# Patient Record
Sex: Female | Born: 1935 | Race: White | Hispanic: No | Smoking: Never smoker
Health system: Southern US, Community
[De-identification: ages and names within clinical notes are randomized; demographics above are authoritative.]

## PROBLEM LIST (undated history)

## (undated) DIAGNOSIS — E785 Hyperlipidemia, unspecified: Secondary | ICD-10-CM

## (undated) DIAGNOSIS — G20 Parkinson's disease: Secondary | ICD-10-CM

## (undated) DIAGNOSIS — I10 Essential (primary) hypertension: Secondary | ICD-10-CM

## (undated) DIAGNOSIS — I4891 Unspecified atrial fibrillation: Secondary | ICD-10-CM

## (undated) DIAGNOSIS — I495 Sick sinus syndrome: Secondary | ICD-10-CM

## (undated) HISTORY — DX: Parkinson's disease: G20

## (undated) HISTORY — PX: GALLBLADDER SURGERY: SHX652

## (undated) HISTORY — PX: HIP SURGERY: SHX245

## (undated) HISTORY — PX: CERVICAL DISC SURGERY: SHX588

## (undated) HISTORY — DX: Essential (primary) hypertension: I10

## (undated) HISTORY — DX: Hyperlipidemia, unspecified: E78.5

## (undated) HISTORY — DX: Unspecified atrial fibrillation: I48.91

## (undated) HISTORY — DX: Sick sinus syndrome: I49.5

## (undated) HISTORY — PX: OTHER SURGICAL HISTORY: SHX169

---
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
## 1964-08-22 HISTORY — PX: ABDOMINAL HYSTERECTOMY: SHX81

## 1967-08-23 HISTORY — PX: CHOLECYSTECTOMY: SHX55

## 1994-08-22 HISTORY — PX: WRIST SURGERY: SHX841

## 2008-08-22 HISTORY — PX: BREAST BIOPSY: SHX20

## 2008-11-06 ENCOUNTER — Emergency Department: Payer: Self-pay | Admitting: Emergency Medicine

## 2010-04-21 ENCOUNTER — Ambulatory Visit: Payer: Self-pay | Admitting: Gastroenterology

## 2010-04-21 LAB — HM COLONOSCOPY

## 2010-06-26 ENCOUNTER — Inpatient Hospital Stay: Payer: Self-pay | Admitting: Internal Medicine

## 2010-06-29 ENCOUNTER — Encounter: Payer: Self-pay | Admitting: Internal Medicine

## 2010-06-29 ENCOUNTER — Ambulatory Visit: Payer: Self-pay | Admitting: Cardiology

## 2010-06-29 HISTORY — PX: PACEMAKER PLACEMENT: SHX43

## 2010-07-02 ENCOUNTER — Ambulatory Visit: Payer: Self-pay | Admitting: Internal Medicine

## 2010-07-07 ENCOUNTER — Encounter: Payer: Self-pay | Admitting: Internal Medicine

## 2010-07-07 ENCOUNTER — Ambulatory Visit: Payer: Self-pay | Admitting: Cardiovascular Disease

## 2010-07-07 LAB — CONVERTED CEMR LAB: POC INR: 2.8

## 2010-07-08 ENCOUNTER — Ambulatory Visit: Payer: Self-pay | Admitting: Cardiology

## 2010-07-08 DIAGNOSIS — Z950 Presence of cardiac pacemaker: Secondary | ICD-10-CM

## 2010-07-08 DIAGNOSIS — I4891 Unspecified atrial fibrillation: Secondary | ICD-10-CM | POA: Insufficient documentation

## 2010-07-08 DIAGNOSIS — I10 Essential (primary) hypertension: Secondary | ICD-10-CM

## 2010-07-12 ENCOUNTER — Encounter: Payer: Self-pay | Admitting: Internal Medicine

## 2010-07-12 ENCOUNTER — Ambulatory Visit: Payer: Self-pay | Admitting: Internal Medicine

## 2010-07-14 ENCOUNTER — Ambulatory Visit: Payer: Self-pay | Admitting: Cardiovascular Disease

## 2010-07-14 LAB — CONVERTED CEMR LAB: POC INR: 4.9

## 2010-07-21 ENCOUNTER — Ambulatory Visit: Payer: Self-pay | Admitting: Cardiovascular Disease

## 2010-07-21 ENCOUNTER — Encounter: Payer: Self-pay | Admitting: Cardiology

## 2010-07-21 DIAGNOSIS — I87329 Chronic venous hypertension (idiopathic) with inflammation of unspecified lower extremity: Secondary | ICD-10-CM

## 2010-07-23 LAB — CONVERTED CEMR LAB
Chloride: 98 meq/L (ref 96–112)
Potassium: 4.3 meq/L (ref 3.5–5.3)
Sodium: 137 meq/L (ref 135–145)

## 2010-08-04 ENCOUNTER — Ambulatory Visit: Payer: Self-pay | Admitting: Cardiovascular Disease

## 2010-08-04 LAB — CONVERTED CEMR LAB: POC INR: 2.3

## 2010-08-18 ENCOUNTER — Ambulatory Visit: Payer: Self-pay | Admitting: Cardiovascular Disease

## 2010-08-18 ENCOUNTER — Telehealth: Payer: Self-pay | Admitting: Internal Medicine

## 2010-08-18 LAB — CONVERTED CEMR LAB: POC INR: 1

## 2010-08-25 ENCOUNTER — Ambulatory Visit: Admission: RE | Admit: 2010-08-25 | Discharge: 2010-08-25 | Payer: Self-pay | Source: Home / Self Care

## 2010-08-25 LAB — CONVERTED CEMR LAB: POC INR: 1.4

## 2010-09-01 ENCOUNTER — Ambulatory Visit: Admission: RE | Admit: 2010-09-01 | Discharge: 2010-09-01 | Payer: Self-pay | Source: Home / Self Care

## 2010-09-08 ENCOUNTER — Ambulatory Visit: Admission: RE | Admit: 2010-09-08 | Discharge: 2010-09-08 | Payer: Self-pay | Source: Home / Self Care

## 2010-09-08 LAB — CONVERTED CEMR LAB: POC INR: 2.7

## 2010-09-10 ENCOUNTER — Telehealth: Payer: Self-pay | Admitting: Internal Medicine

## 2010-09-10 ENCOUNTER — Encounter: Payer: Self-pay | Admitting: Internal Medicine

## 2010-09-10 ENCOUNTER — Ambulatory Visit
Admission: RE | Admit: 2010-09-10 | Discharge: 2010-09-10 | Payer: Self-pay | Source: Home / Self Care | Attending: Internal Medicine | Admitting: Internal Medicine

## 2010-09-15 ENCOUNTER — Ambulatory Visit: Admission: RE | Admit: 2010-09-15 | Discharge: 2010-09-15 | Payer: Self-pay | Source: Home / Self Care

## 2010-09-15 LAB — CONVERTED CEMR LAB: POC INR: 3.1

## 2010-09-21 NOTE — Op Note (Signed)
[facility], [facility]   [facility]   Imported By: Roderic Ovens 07/05/2010 10:23:33  _____________________________________________________________________  External Attachment:    Type:   Image     Comment:   External Document

## 2010-09-21 NOTE — Procedures (Signed)
Summary: 8:30 APPT WITH PAULA ONLY/AMD   Current Medications (verified): 1)  Metoprolol Succinate 25 Mg Xr24h-Tab (Metoprolol Succinate) .... One Tablet Once Daily 2)  Meclizine Hcl 25 Mg Tabs (Meclizine Hcl) .... One Tablet Three Times A Day As Needed 3)  Losartan Potassium 100 Mg Tabs (Losartan Potassium) .... One Tablet Every A.m. and 1 1/2 Tablet Every P.m. 4)  Vitamin D3 0400 Unit Caps (Cholecalciferol) .... One By Mouth Daily 5)  Vitamin B-12 500 Mcg Tabs (Cyanocobalamin) .... One Tablet Once Daily 6)  Lovastatin 20 Mg Tabs (Lovastatin) .... One Tablet At Bedtime 7)  Tylenol 8 Hour 650 Mg Cr-Tabs (Acetaminophen) .... As Needed 8)  Requip 0.5 Mg Tabs (Ropinirole Hcl) .... One Tablet Every 8 Hours As Needed 9)  Norvasc 10 Mg Tabs (Amlodipine Besylate) .... One Tablet Once Daily 10)  Warfarin Sodium 5 Mg Tabs (Warfarin Sodium) .... One Tablet Once Daily  Allergies (verified): 1)  ! Codeine 2)  ! Demerol 3)  ! Morphine 4)  ! Valium 5)  ! Jonne Ply   PPM Specifications Following MD:  Lewayne Bunting, MD     PPM Vendor:  Medtronic     PPM Model Number:  ADDR01     PPM Serial Number:  [id number] PPM DOI:  06/29/2010     PPM Implanting MD:  Lewayne Bunting, MD  Lead 1    Location: RA     DOI: 06/29/2010     Model #: [id number]     Serial #: [id number]     Status: active Lead 2    Location: RV     DOI: 06/29/2010     Model #: [id number]     Serial #: [id number]     Status: active  Magnet Response Rate:  BOL 85 ERI 65  Indications:  Sick sinus syndrome   PPM Follow Up Remote Check?  No Battery Voltage:  2.80 V     Battery Est. Longevity:  10.5 years     Pacer Dependent:  No       PPM Device Measurements Atrium  Amplitude: 2.0 mV, Impedance: 601 ohms, Threshold: 0.5 V at 0.4 msec Right Ventricle  Amplitude: 16.0 mV, Impedance: 644 ohms, Threshold: 0.5 V at 0.4 msec  Episodes MS Episodes:  0     Percent Mode Switch:  <0.1%     Coumadin:  Yes Ventricular High Rate:  0     Atrial Pacing:  79.8%      Ventricular Pacing:  0.6%  Parameters Mode:  DDDR     Lower Rate Limit:  60     Upper Rate Limit:  120 Paced AV Delay:  180     Sensed AV Delay:  150 Next Cardiology Appt Due:  09/22/2010 Tech Comments:  Steri strips removed no redness or edema noted.  Rate response on today.  Device function normal.  ROV 3 months with Dr. Ladona Ridgel in [place]. Altha Harm, LPN  July 12, 2010 9:02 AM

## 2010-09-21 NOTE — Assessment & Plan Note (Signed)
Summary: NP6/AMD   Visit Type:  Initial Consult Primary Provider:  Nilda Simmer, M.D.  CC:  F/U [facility].  Denies chest pain or shortness of breath.  Has a fullness in head today.Whitney Hoffman  History of Present Illness: 77 yo with history of paroxysmal atrial fibrillation, HTN, and recent pacemaker implantation presents to establish outpatient cardiology care.  Patient was admitted to [facility] in 11/11 with syncope.  She was found to have tachy-brady syndrome with atrial fibrillation as well as periods of long pauses.  She has previously been followed for her atrial fibrillation by [facility] in [place].  A Medtronic dual chamber pacemaker was placed and patient was started on coumadin for her atrial fibrillation.   Since going home, she has been doing well.  She lives alone.  She is able to do housework without trouble.  No episodes of chest pain or exertional dyspnea.    ECG: a-paced, otherwise normal  Labs (11/11): K 4, creatinine 0.79  Preventive Screening-Counseling & Management  Alcohol-Tobacco     Smoking Status: never      Drug Use:  no.    Current Medications (verified): 1)  Metoprolol Succinate 25 Mg Xr24h-Tab (Metoprolol Succinate) .... One Tablet Once Daily 2)  Meclizine Hcl 25 Mg Tabs (Meclizine Hcl) .... One Tablet Three Times A Day As Needed 3)  Losartan Potassium 100 Mg Tabs (Losartan Potassium) .... One Tablet Once Daily 4)  Vitamin D3 10000 Unit Caps (Cholecalciferol) .... Four Daily 5)  Vitamin B-12 500 Mcg Tabs (Cyanocobalamin) .... One Tablet Once Daily 6)  Lovastatin 20 Mg Tabs (Lovastatin) .... One Tablet At Bedtime 7)  Aspir-Low 81 Mg Tbec (Aspirin) .... One Tablet Once Daily 8)  Tylenol 8 Hour 650 Mg Cr-Tabs (Acetaminophen) .... As Needed 9)  Requip 0.5 Mg Tabs (Ropinirole Hcl) .... One Tablet Every 8 Hours As Needed 10)  Norvasc 10 Mg Tabs (Amlodipine Besylate) .... One Tablet Once Daily 11)  Warfarin Sodium 5 Mg Tabs (Warfarin Sodium) .... One Tablet Once  Daily  Allergies (verified): 1)  ! Codeine 2)  ! Demerol 3)  ! Morphine 4)  ! Valium  Past History:  Family History: Last updated: 07-28-2010 Father:CAD; deceased MI age 79. Mother: Deceased; diabetes  Social History: Last updated: Jul 28, 2010 Retired  Widowed, lives alone, 3 children.  Lives in [place].   Tobacco Use - No.  Alcohol Use - no Drug Use - no  Risk Factors: Smoking Status: never (2010/07/28)  Past Medical History: 1. Hypertension 2. Hyperlipidemia 3. Parkinson's disease 4. Atrial fibrillation  5. Tachy-brady syndrome with Medtronic dual chamber PCM  Past Surgical History: appendectomy hysterectomy gallbladder surgery breast biopsy  disk surgery  hip surgery  Family History: Reviewed history and no changes required. Father:CAD; deceased [place] age 28. Mother: Deceased; diabetes  Social History: Retired  Widowed, lives alone, 3 children.  Lives in [place].   Tobacco Use - No.  Alcohol Use - no Drug Use - no Smoking Status:  never Drug Use:  no  Review of Systems       All systems reviewed and negative except as per HPI.   Vital Signs:  Patient profile:   77 year old female Height:      63 inches Weight:      147.25 pounds BMI:     26.18 Pulse rate:   60 / minute BP sitting:   144 / 60  (left arm) Cuff size:   regular  Vitals Entered By: Bishop Dublin, CMA (07/28/10 4:16  PM)  Physical Exam  General:  Well developed, well nourished, in no acute distress. Head:  normocephalic and atraumatic Nose:  no deformity, discharge, inflammation, or lesions Mouth:  Teeth, gums and palate normal. Oral mucosa normal. Neck:  Neck supple, no JVD. No masses, thyromegaly or abnormal cervical nodes. Lungs:  Clear bilaterally to auscultation and percussion. Heart:  Non-displaced PMI, chest non-tender; regular rate and rhythm, S1, S2 without murmurs, rubs. +S4. Carotid upstroke normal, no bruit. Pedals normal pulses. No edema, no  varicosities. Abdomen:  Bowel sounds positive; abdomen soft and non-tender without masses, organomegaly, or hernias noted. No hepatosplenomegaly. Extremities:  No clubbing or cyanosis. Neurologic:  Alert and oriented x 3. Skin:  Intact without lesions or rashes. Psych:  Normal affect.   PPM Specifications Following MD:  Lewayne Bunting, MD     PPM Vendor:  Medtronic     PPM Model Number:  ADDR01     PPM Serial Number:  [id number] PPM DOI:  06/29/2010     PPM Implanting MD:  Lewayne Bunting, MD  Lead 1    Location: RA     DOI: 06/29/2010     Model #: 1914     Serial #: [id number]     Status: active Lead 2    Location: RV     DOI: 06/29/2010     Model #: [id number]     Serial #: [id number]     Status: active  Magnet Response Rate:  BOL 85 ERI 65  Indications:  Sick sinus syndrome   Impression & Recommendations:  Problem # 1:  PACEMAKER, PERMANENT (ICD-V45.01) Tachy-brady syndrome s/p Medtronic dual chamber pacemaker.  She will be set up for regular PCM checks.   Problem # 2:  ATRIAL FIBRILLATION (ICD-427.31) Patient currently is a-paced.  She is on warfarin (CHADSVASC 4) and Toprol XL.  She can stop aspirin.   Problem # 3:  HYPERTENSION, UNSPECIFIED (ICD-401.9) BP running high, will increase losartan to 100 mg qam, 50 mg qpm.  Continue amlodipine. Will get BMET in 2 wks.   Other Orders: EKG w/ Interpretation (93000)  Patient Instructions: 1)  Your physician recommends that you schedule a follow-up appointment in: pacer clinic 2)  Your physician recommends that you return for lab work in: BMET 2 weeks. 3)  Your physician has recommended you make the following change in your medication: Losartan 100 mg one tablet in the a.m. and 150 mg in the p.m. 4)  Stop aspirin.

## 2010-09-21 NOTE — Medication Information (Signed)
Summary: rov/tm  Anticoagulant Therapy  Managed by: Bethena Midget, RN, BSN PCP: Nilda Simmer, M.D. Supervising MD: Mariah Milling MD, Tim Indication 1: Atrial Fibrillation Lab Used: [facility] [place] Site: [place] INR POC 1.5 INR RANGE 2.0-3.0  Dietary changes: no    Health status changes: yes       Details: Some itching and stinging at device site which was placed 3 weeks yesterday  Bleeding/hemorrhagic complications: no    Recent/future hospitalizations: no    Any changes in medication regimen? no    Recent/future dental: no  Any missed doses?: no       Is patient compliant with meds? yes       Allergies: 1)  ! Codeine 2)  ! Demerol 3)  ! Morphine 4)  ! Valium  Anticoagulation Management History:      The patient is taking warfarin and comes in today for a routine follow up visit.  Positive risk factors for bleeding include an age of 75 years or older.  The bleeding index is 'intermediate risk'.  Positive CHADS2 values include History of HTN and Age > 75 years old.  Anticoagulation responsible Bianney Rockwood: Mariah Milling MD, Tim.  INR POC: 1.5.  Cuvette Lot#: [id number].  Exp: 07/2011.    Anticoagulation Management Assessment/Plan:      The patient's current anticoagulation dose is Warfarin sodium 5 mg tabs: one tablet once daily.  The target INR is 2.0-3.0.  The next INR is due 08/04/2010.  Anticoagulation instructions were given to patient/daughter.  Results were reviewed/authorized by Bethena Midget, RN, BSN.  She was notified by Bethena Midget, RN, BSN.         Prior Anticoagulation Instructions: INR 4.9 Skip Today and Thursday's dose then change dose to 1/2 pill everyday except 1 pill on Sundays, Tuesdays and Thurdays. Recheck in one week.   Current Anticoagulation Instructions: INR 1.5 Today take 1 tablet then change dose to 1 tablet everyday except 1/2 tablet on Mondays, Wednesdays and Fridays. Recheck in 2 weeks.

## 2010-09-21 NOTE — Miscellaneous (Signed)
Summary: Device preload  Clinical Lists Changes  Observations: Added new observation of PPM INDICATN: Sick sinus syndrome (07/07/2010 12:53) Added new observation of MAGNET RTE: BOL 85 ERI 65 (07/07/2010 12:53) Added new observation of PPMLEADSTAT2: active (07/07/2010 12:53) Added new observation of PPMLEADSER2: [id number] (07/07/2010 12:53) Added new observation of PPMLEADMOD2: 5076  (07/07/2010 12:53) Added new observation of PPMLEADLOC2: RV  (07/07/2010 12:53) Added new observation of PPMLEADSTAT1: active  (07/07/2010 12:53) Added new observation of PPMLEADSER1: [id number]  (07/07/2010 12:53) Added new observation of PPMLEADMOD1: 5076  (07/07/2010 12:53) Added new observation of PPMLEADLOC1: RA  (07/07/2010 12:53) Added new observation of PPM IMP MD: Lewayne Bunting, MD  (07/07/2010 12:53) Added new observation of PPMLEADDOI2: 06/29/2010  (07/07/2010 12:53) Added new observation of PPMLEADDOI1: 06/29/2010  (07/07/2010 12:53) Added new observation of PPM DOI: 06/29/2010  (07/07/2010 12:53) Added new observation of PPM SERL#: [id number]  (07/07/2010 12:53) Added new observation of PPM MODL#: [id number]  (07/07/2010 02:72) Added new observation of PACEMAKERMFG: Medtronic  (07/07/2010 12:53) Added new observation of PACEMAKER MD: Lewayne Bunting, MD  (07/07/2010 12:53)      PPM Specifications Following MD:  Lewayne Bunting, MD     PPM Vendor:  Medtronic     PPM Model Number:  ADDR01     PPM Serial Number:  [id number] PPM DOI:  06/29/2010     PPM Implanting MD:  Lewayne Bunting, MD  Lead 1    Location: RA     DOI: 06/29/2010     Model #: 7425     Serial #: [id number]     Status: active Lead 2    Location: RV     DOI: 06/29/2010     Model #: 3295     Serial #: [id number]     Status: active  Magnet Response Rate:  BOL 85 ERI 65  Indications:  Sick sinus syndrome

## 2010-09-21 NOTE — Medication Information (Signed)
Summary: CCR/AMD  Anticoagulant Therapy  Managed by: Weston Brass, PharmD Supervising MD: Gala Romney MD, Reuel Boom Indication 1: Atrial Fibrillation Lab Used: [facility] [place] Site: [place] INR POC 1.9 INR RANGE 2.0-3.0  Dietary changes: no    Health status changes: no    Bleeding/hemorrhagic complications: no    Recent/future hospitalizations: yes       Details: started Coumadin at [facility] on 11/8 or 11/9  Any changes in medication regimen? yes       Details: added norvasc   Recent/future dental: no  Any missed doses?: no       Is patient compliant with meds? yes      Comments: Pt and daugther were educated by Era Skeen, CMA.  Spoke with pt's daughter.  They have no additional questions regarding Coumadin at this time.    Anticoagulation Management History:      The patient comes in today for her initial visit for anticoagulation therapy.  Positive risk factors for bleeding include an age of 75 years or older.  The bleeding index is 'intermediate risk'.  Positive CHADS2 values include Age > 75 years old.  Anticoagulation responsible Aldrin Engelhard: Bensimhon MD, Reuel Boom.  INR POC: 1.9.    Anticoagulation Management Assessment/Plan:      The target INR is 2.0-3.0.  The next INR is due 07/07/2010.  Anticoagulation instructions were given to patient/daughter.  Results were reviewed/authorized by Weston Brass, PharmD.  She was notified by Weston Brass PharmD.         Current Anticoagulation Instructions: INR 1.9  LMOM for pt's daughter.   Instructed to continue 5mg  daily. Weston Brass PharmD  July 02, 2010 4:50PM   Spoke with pt's daughter.  Take 2.5mg  today then resume same dose of 5mg  daily.  Recheck INR on 11/16 in [facility].  Weston Brass PharmD  July 05, 2010 10:51 AM

## 2010-09-21 NOTE — Medication Information (Signed)
Summary: rov/sp  Anticoagulant Therapy  Managed by: Bethena Midget, RN, BSN Supervising MD: Mariah Milling MD, Tim Indication 1: Atrial Fibrillation Lab Used: [facility] [place] Site: [place] INR POC 2.8 INR RANGE 2.0-3.0  Dietary changes: no    Health status changes: no    Bleeding/hemorrhagic complications: no    Recent/future hospitalizations: no    Any changes in medication regimen? no    Recent/future dental: no  Any missed doses?: no       Is patient compliant with meds? yes       Anticoagulation Management History:      The patient is taking warfarin and comes in today for a routine follow up visit.  Positive risk factors for bleeding include an age of 75 years or older.  The bleeding index is 'intermediate risk'.  Positive CHADS2 values include Age > 75 years old.  Anticoagulation responsible provider: Mariah Milling MD, Tim.  INR POC: 2.8.  Cuvette Lot#: [id number].  Exp: 07/2011.    Anticoagulation Management Assessment/Plan:      The target INR is 2.0-3.0.  The next INR is due 07/14/2010.  Anticoagulation instructions were given to patient/daughter.  Results were reviewed/authorized by Bethena Midget, RN, BSN.  She was notified by Bethena Midget, RN, BSN.         Prior Anticoagulation Instructions: INR 1.9  LMOM for pt's daughter.   Instructed to continue 5mg  daily. Weston Brass PharmD  July 02, 2010 4:50PM   Spoke with pt's daughter.  Take 2.5mg  today then resume same dose of 5mg  daily.  Recheck INR on 11/16 in [facility] clinic.  Weston Brass PharmD  July 05, 2010 10:51 AM   Current Anticoagulation Instructions: INR 2.8 Change dose to 1 pill everyday except 1/2 pill on Mondays. Recheck in one week.

## 2010-09-21 NOTE — Medication Information (Signed)
Summary: rov/tm  Anticoagulant Therapy  Managed by: Bethena Midget, RN, BSN PCP: Nilda Simmer, M.D. Supervising MD: Mariah Milling MD, Tim Indication 1: Atrial Fibrillation Lab Used: [facility] [place] Site: [place] INR POC 4.9 INR RANGE 2.0-3.0  Dietary changes: no    Health status changes: no    Bleeding/hemorrhagic complications: no    Recent/future hospitalizations: no    Any changes in medication regimen? no    Recent/future dental: no  Any missed doses?: no       Is patient compliant with meds? yes       Allergies: 1)  ! Codeine 2)  ! Demerol 3)  ! Morphine 4)  ! Valium  Anticoagulation Management History:      The patient is taking warfarin and comes in today for a routine follow up visit.  Positive risk factors for bleeding include an age of 75 years or older.  The bleeding index is 'intermediate risk'.  Positive CHADS2 values include History of HTN and Age > 67 years old.  Anticoagulation responsible provider: Mariah Milling MD, Tim.  INR POC: 4.9.  Cuvette Lot#: [id number].  Exp: 07/2011.    Anticoagulation Management Assessment/Plan:      The patient's current anticoagulation dose is Warfarin sodium 5 mg tabs: one tablet once daily.  The target INR is 2.0-3.0.  The next INR is due 07/21/2010.  Anticoagulation instructions were given to patient/daughter.  Results were reviewed/authorized by Bethena Midget, RN, BSN.  She was notified by Bethena Midget, RN, BSN.         Prior Anticoagulation Instructions: INR 2.8 Change dose to 1 pill everyday except 1/2 pill on Mondays. Recheck in one week.   Current Anticoagulation Instructions: INR 4.9 Skip Today and Thursday's dose then change dose to 1/2 pill everyday except 1 pill on Sundays, Tuesdays and Thurdays. Recheck in one week.

## 2010-09-22 ENCOUNTER — Ambulatory Visit: Payer: Self-pay | Admitting: Family Medicine

## 2010-09-23 NOTE — Medication Information (Signed)
Summary: rov/tm  Anticoagulant Therapy  Managed by: Cloyde Reams, RN, BSN Referring MD: Shirlee Latch MD, Freida Busman PCP: Nilda Simmer, M.D. Supervising MD: Gala Romney MD, Reuel Boom Indication 1: Atrial Fibrillation Lab Used: [facility] [place] Site: [place] INR POC 3.1 INR RANGE 2.0-3.0  Dietary changes: no    Health status changes: no    Bleeding/hemorrhagic complications: no    Recent/future hospitalizations: no    Any changes in medication regimen? no    Recent/future dental: no  Any missed doses?: no       Is patient compliant with meds? yes       Allergies: 1)  ! Codeine 2)  ! Demerol 3)  ! Morphine 4)  ! Valium 5)  ! Asa  Anticoagulation Management History:      The patient is taking warfarin and comes in today for a routine follow up visit.  Positive risk factors for bleeding include an age of 40 years or older.  The bleeding index is 'intermediate risk'.  Positive CHADS2 values include History of HTN and Age > 101 years old.  Anticoagulation responsible provider: Keiri Solano MD, Reuel Boom.  INR POC: 3.1.  Cuvette Lot#: [id number].  Exp: 09/2011.    Anticoagulation Management Assessment/Plan:      The patient's current anticoagulation dose is Warfarin sodium 5 mg tabs: Take as directed by coumadin clinic..  The target INR is 2.0-3.0.  The next INR is due 09/29/2010.  Anticoagulation instructions were given to patient/daughter.  Results were reviewed/authorized by Cloyde Reams, RN, BSN.  She was notified by Cloyde Reams RN.         Prior Anticoagulation Instructions: INR 2.7 Continue 5mg s everyday. Recheck in one week.   Current Anticoagulation Instructions: INR 3.1  Start taking 1 tablet daily except 1/2 tablet on Wednesdays.  Recheck in 2 weeks.

## 2010-09-23 NOTE — Medication Information (Signed)
Summary: rov/ewj  Anticoagulant Therapy  Managed by: Cloyde Reams, RN, BSN PCP: Nilda Simmer, M.D. Supervising MD: Mariah Milling Indication 1: Atrial Fibrillation Lab Used: [facility] [place] Site: [place] INR POC 1.9 INR RANGE 2.0-3.0  Dietary changes: no    Health status changes: no    Bleeding/hemorrhagic complications: no    Recent/future hospitalizations: no    Any changes in medication regimen? yes       Details: Going to try zantac 75mg  prn.   Recent/future dental: no  Any missed doses?: no       Is patient compliant with meds? yes       Allergies: 1)  ! Codeine 2)  ! Demerol 3)  ! Morphine 4)  ! Valium 5)  ! Asa  Anticoagulation Management History:      The patient is taking warfarin and comes in today for a routine follow up visit.  Positive risk factors for bleeding include an age of 75 years or older.  The bleeding index is 'intermediate risk'.  Positive CHADS2 values include History of HTN and Age > 1 years old.  Anticoagulation responsible provider: Reed Dady.  INR POC: 1.9.  Cuvette Lot#: [id number].  Exp: 09/2011.    Anticoagulation Management Assessment/Plan:      The patient's current anticoagulation dose is Warfarin sodium 5 mg tabs: Take as directed by coumadin clinic..  The target INR is 2.0-3.0.  The next INR is due 09/08/2010.  Anticoagulation instructions were given to patient/daughter.  Results were reviewed/authorized by Cloyde Reams, RN, BSN.  She was notified by Cloyde Reams RN.         Prior Anticoagulation Instructions: INR 1.4  Start taking 1 tablet daily except 1/2 tablet on Mondays.  Recheck in 1 week.  Call us when you need your Coumadin refilled.   Current Anticoagulation Instructions: INR 1.9  Start taking 5mg  daily.  Recheck in 1 week.

## 2010-09-23 NOTE — Medication Information (Signed)
Summary: rov/ewj  Anticoagulant Therapy  Managed by: Cloyde Reams, RN, BSN PCP: Nilda Simmer, M.D. Supervising MD: Mariah Milling MD, Tim Indication 1: Atrial Fibrillation Lab Used: [facility] [place] Site: [place] INR POC 1.0 INR RANGE 2.0-3.0  Dietary changes: no    Health status changes: no    Bleeding/hemorrhagic complications: no    Recent/future hospitalizations: no    Any changes in medication regimen? no    Recent/future dental: no  Any missed doses?: yes     Details: Missed 2 doses 12/24 and 12/25.    Is patient compliant with meds? yes       Allergies: 1)  ! Codeine 2)  ! Demerol 3)  ! Morphine 4)  ! Valium 5)  ! Asa  Anticoagulation Management History:      The patient is taking warfarin and comes in today for a routine follow up visit.  Positive risk factors for bleeding include an age of 75 years or older.  The bleeding index is 'intermediate risk'.  Positive CHADS2 values include History of HTN and Age > 2 years old.  Anticoagulation responsible provider: Mariah Milling MD, Tim.  INR POC: 1.0.  Cuvette Lot#: [id number].  Exp: 07/2011.    Anticoagulation Management Assessment/Plan:      The patient's current anticoagulation dose is Warfarin sodium 5 mg tabs: Take as directed by coumadin clinic..  The target INR is 2.0-3.0.  The next INR is due 08/25/2010.  Anticoagulation instructions were given to patient/daughter.  Results were reviewed/authorized by Cloyde Reams, RN, BSN.  She was notified by Cloyde Reams RN.         Prior Anticoagulation Instructions: INR 2.3  Continue on same dosage 1 tablet daily except 1/2 tablet on Mondays, Wednesdays, and Fridays.  Recheck in 2 weeks.   Current Anticoagulation Instructions: INR 1.0  Take 1.5 tablets today and tomorrow, then resume same dosage 1 tablet daily except 1/2 tablet on Mondays, Wednesdays, and Fridays.  Recheck in 1 week.

## 2010-09-23 NOTE — Procedures (Signed)
Summary: PACER/AMD   Current Medications (verified): 1)  Metoprolol Succinate 25 Mg Xr24h-Tab (Metoprolol Succinate) .... One Tablet Once Daily 2)  Meclizine Hcl 25 Mg Tabs (Meclizine Hcl) .... One Tablet Three Times A Day As Needed 3)  Losartan Potassium 100 Mg Tabs (Losartan Potassium) .... One Tablet Every A.m. and 1 1/2 Tablet Every P.m. 4)  Vitamin D3 0400 Unit Caps (Cholecalciferol) .... One By Mouth Daily 5)  Vitamin B-12 500 Mcg Tabs (Cyanocobalamin) .... One Tablet Once Daily 6)  Lovastatin 20 Mg Tabs (Lovastatin) .... One Tablet At Bedtime 7)  Tylenol 8 Hour 650 Mg Cr-Tabs (Acetaminophen) .... As Needed 8)  Requip 0.5 Mg Tabs (Ropinirole Hcl) .... One Tablet Every 8 Hours As Needed 9)  Norvasc 10 Mg Tabs (Amlodipine Besylate) .... One Tablet Once Daily 10)  Warfarin Sodium 5 Mg Tabs (Warfarin Sodium) .... Take As Directed By Coumadin Clinic.  Allergies (verified): 1)  ! Codeine 2)  ! Demerol 3)  ! Morphine 4)  ! Valium 5)  ! Jonne Ply   PPM Specifications Following MD:  Lewayne Bunting, MD     PPM Vendor:  Medtronic     PPM Model Number:  ADDR01     PPM Serial Number:  [id number] PPM DOI:  06/29/2010     PPM Implanting MD:  Lewayne Bunting, MD  Lead 1    Location: RA     DOI: 06/29/2010     Model #: [id number]     Serial #: [id number]     Status: active Lead 2    Location: RV     DOI: 06/29/2010     Model #: 2536     Serial #: [id number]     Status: active  Magnet Response Rate:  BOL 85 ERI 65  Indications:  Sick sinus syndrome   PPM Follow Up Remote Check?  No Battery Voltage:  2.80 V     Battery Est. Longevity:  10 years     Pacer Dependent:  No       PPM Device Measurements Atrium  Amplitude: 4.0 mV, Impedance: 563 ohms, Threshold: 0.375 V at 0.4 msec Right Ventricle  Amplitude: 22.40 mV, Impedance: 669 ohms, Threshold: 0.625 V at 0.4 msec  Episodes MS Episodes:  4     Percent Mode Switch:  <0.1%     Coumadin:  Yes Ventricular High Rate:  2     Atrial Pacing:  93.3%      Ventricular Pacing:  0.3%  Parameters Mode:  DDDR     Lower Rate Limit:  60     Upper Rate Limit:  120 Paced AV Delay:  180     Sensed AV Delay:  150 Next Cardiology Appt Due:  06/23/2011 Tech Comments:  Outputs reprogrammed for chronic thresholds.  Ms. Pyon c/o some irritation on the incision line, although the area is well healed.  Device function normal. ROV 11/12 with Dr. Ladona Ridgel in [place]. Altha Harm, LPN  September 10, 2010 3:42 PM

## 2010-09-23 NOTE — Medication Information (Signed)
Summary: rov/tm  Anticoagulant Therapy  Managed by: Cloyde Reams, RN, BSN PCP: Nilda Simmer, M.D. Supervising MD: Mariah Milling MD, Tim Indication 1: Atrial Fibrillation Lab Used: [facility] [place] Site: [place] INR POC 2.3 INR RANGE 2.0-3.0  Dietary changes: no    Health status changes: no    Bleeding/hemorrhagic complications: no    Recent/future hospitalizations: no    Any changes in medication regimen? no    Recent/future dental: no  Any missed doses?: no       Is patient compliant with meds? yes       Allergies: 1)  ! Codeine 2)  ! Demerol 3)  ! Morphine 4)  ! Valium 5)  ! Asa  Anticoagulation Management History:      The patient is taking warfarin and comes in today for a routine follow up visit.  Positive risk factors for bleeding include an age of 75 years or older.  The bleeding index is 'intermediate risk'.  Positive CHADS2 values include History of HTN and Age > 66 years old.  Anticoagulation responsible Shateka Petrea: Mariah Milling MD, Tim.  INR POC: 2.3.  Cuvette Lot#: [id number].  Exp: 07/2011.    Anticoagulation Management Assessment/Plan:      The patient's current anticoagulation dose is Warfarin sodium 5 mg tabs: Take as directed by coumadin clinic..  The target INR is 2.0-3.0.  The next INR is due 08/18/2010.  Anticoagulation instructions were given to patient/daughter.  Results were reviewed/authorized by Cloyde Reams, RN, BSN.  She was notified by Cloyde Reams RN.         Prior Anticoagulation Instructions: INR 1.5 Today take 1 tablet then change dose to 1 tablet everyday except 1/2 tablet on Mondays, Wednesdays and Fridays. Recheck in 2 weeks.   Current Anticoagulation Instructions: INR 2.3  Continue on same dosage 1 tablet daily except 1/2 tablet on Mondays, Wednesdays, and Fridays.  Recheck in 2 weeks.  Prescriptions: WARFARIN SODIUM 5 MG TABS (WARFARIN SODIUM) Take as directed by coumadin clinic.  #30 x 3   Entered by:   Cloyde Reams  RN   Authorized by:   Dossie Arbour MD   Signed by:   Cloyde Reams RN on 08/04/2010   Method used:   [facility] to        [facility] [phone]* (retail)       [address]       [place], [place]  [address]       Ph: [phone]       Fax: [phone]   RxID:   [id number]

## 2010-09-23 NOTE — Progress Notes (Signed)
Summary: RX  Phone Note Refill Request Call back at Home Phone [phone] Message from:  Patient on September 10, 2010 2:49 PM  Refills Requested: Medication #1:  WARFARIN SODIUM 5 MG TABS Take as directed by [facility] clinic[facility] [place]  Initial call taken by: Harlon Flor,  September 10, 2010 2:49 PM    Prescriptions: WARFARIN SODIUM 5 MG TABS (WARFARIN SODIUM) Take as directed by coumadin clinic.  #30 x 3   Entered by:   Bishop Dublin, CMA   Authorized by:   Laren Boom, MD, [facility]   Signed by:   Bishop Dublin, CMA on 09/10/2010   Method used:   [facility] to        [facility] [phone]* (retail)       [address]       [place], [place]  [address]       Ph: [phone]       Fax: [phone]   RxID:   [id number]

## 2010-09-23 NOTE — Medication Information (Signed)
Summary: rov/ewj  Anticoagulant Therapy  Managed by: Cloyde Reams, RN, BSN PCP: Nilda Simmer, M.D. Supervising MD: Shirlee Latch MD, Shelton Soler Indication 1: Atrial Fibrillation Lab Used: [facility] [place] Site: [place] INR POC 1.4 INR RANGE 2.0-3.0  Dietary changes: no    Health status changes: no    Bleeding/hemorrhagic complications: no    Recent/future hospitalizations: no    Any changes in medication regimen? no    Recent/future dental: no  Any missed doses?: no       Is patient compliant with meds? yes       Allergies: 1)  ! Codeine 2)  ! Demerol 3)  ! Morphine 4)  ! Valium 5)  ! Asa  Anticoagulation Management History:      The patient is taking warfarin and comes in today for a routine follow up visit.  Positive risk factors for bleeding include an age of 8 years or older.  The bleeding index is 'intermediate risk'.  Positive CHADS2 values include History of HTN and Age > 31 years old.  Anticoagulation responsible provider: Shirlee Latch MD, Rolly Magri.  INR POC: 1.4.  Cuvette Lot#: [id number].  Exp: 09/2011.    Anticoagulation Management Assessment/Plan:      The patient's current anticoagulation dose is Warfarin sodium 5 mg tabs: Take as directed by coumadin clinic..  The target INR is 2.0-3.0.  The next INR is due 09/01/2010.  Anticoagulation instructions were given to patient/daughter.  Results were reviewed/authorized by Cloyde Reams, RN, BSN.  She was notified by Cloyde Reams RN.         Prior Anticoagulation Instructions: INR 1.0  Take 1.5 tablets today and tomorrow, then resume same dosage 1 tablet daily except 1/2 tablet on Mondays, Wednesdays, and Fridays.  Recheck in 1 week.   Current Anticoagulation Instructions: INR 1.4  Start taking 1 tablet daily except 1/2 tablet on Mondays.  Recheck in 1 week.  Call us when you need your Coumadin refilled.

## 2010-09-23 NOTE — Progress Notes (Signed)
Summary: Clearance to Drive  Phone Note Call from Patient   Caller: Patient Call For: Whitney Hoffman Summary of Call: Pt had a duel chamber pacemaker implanted on 06/29/10 at [facility] for Tachy-Brady syndrome with syncope.  Pt was seen in Coumadin Clinic today and is inquiring about when she can be medically cleared to drive again.  Pt does not have f/u appt with Dr Whitney Hoffman scheduled until 2/12.  Please advise.  Thanks Initial call taken by: Cloyde Reams RN,  August 18, 2010 12:28 PM  Follow-up for Phone Call        If syncope and LOC 6 months from the date of last syncopal event called [place] office and left message for Troy to call me back  Dennis Bast, RN, BSN  August 18, 2010 1:01 PM  Additional Follow-up for Phone Call Additional follow up Details #1::        Called and advised pt of above. Additional Follow-up by: Cloyde Reams RN,  August 18, 2010 4:33 PM

## 2010-09-23 NOTE — Cardiovascular Report (Signed)
Summary: Office Visit   Office Visit   Imported By: Roderic Ovens 08/02/2010 14:27:20  _____________________________________________________________________  External Attachment:    Type:   Image     Comment:   External Document

## 2010-09-23 NOTE — Medication Information (Signed)
Summary: rov/ewj  Anticoagulant Therapy  Managed by: Bethena Midget, RN, BSN Referring MD: Shirlee Latch MD, [place] PCP: Nilda Simmer, M.D. Supervising MD: Mariah Milling Indication 1: Atrial Fibrillation Lab Used: [facility] [place] Site: [place] INR POC 2.7 INR RANGE 2.0-3.0  Dietary changes: no    Health status changes: no    Bleeding/hemorrhagic complications: no    Recent/future hospitalizations: no    Any changes in medication regimen? no    Recent/future dental: no  Any missed doses?: no       Is patient compliant with meds? yes      Comments: Pt thinks Norvasc is making her nauseated, she  will contact prescriber  Allergies: 1)  ! Codeine 2)  ! Demerol 3)  ! Morphine 4)  ! Valium 5)  ! Asa  Anticoagulation Management History:      The patient is taking warfarin and comes in today for a routine follow up visit.  Positive risk factors for bleeding include an age of 75 years or older.  The bleeding index is 'intermediate risk'.  Positive CHADS2 values include History of HTN and Age > 51 years old.  Anticoagulation responsible provider: Jalila Goodnough.  INR POC: 2.7.  Cuvette Lot#: [id number].  Exp: 09/2011.    Anticoagulation Management Assessment/Plan:      The patient's current anticoagulation dose is Warfarin sodium 5 mg tabs: Take as directed by coumadin clinic..  The target INR is 2.0-3.0.  The next INR is due 09/15/2010.  Anticoagulation instructions were given to patient/daughter.  Results were reviewed/authorized by Bethena Midget, RN, BSN.  She was notified by Bethena Midget, RN, BSN.         Prior Anticoagulation Instructions: INR 1.9  Start taking 5mg  daily.  Recheck in 1 week.   Current Anticoagulation Instructions: INR 2.7 Continue 5mg s everyday. Recheck in one week.

## 2010-09-29 ENCOUNTER — Encounter (INDEPENDENT_AMBULATORY_CARE_PROVIDER_SITE_OTHER): Payer: Medicare Other

## 2010-09-29 ENCOUNTER — Encounter: Payer: Self-pay | Admitting: Cardiovascular Disease

## 2010-09-29 DIAGNOSIS — I4891 Unspecified atrial fibrillation: Secondary | ICD-10-CM

## 2010-09-29 DIAGNOSIS — Z7901 Long term (current) use of anticoagulants: Secondary | ICD-10-CM

## 2010-09-29 LAB — CONVERTED CEMR LAB: POC INR: 3.7

## 2010-09-29 NOTE — Cardiovascular Report (Signed)
Summary: Office Visit   Office Visit   Imported By: Roderic Ovens 09/22/2010 15:58:32  _____________________________________________________________________  External Attachment:    Type:   Image     Comment:   External Document

## 2010-10-01 ENCOUNTER — Telehealth: Payer: Self-pay | Admitting: Internal Medicine

## 2010-10-04 ENCOUNTER — Other Ambulatory Visit (INDEPENDENT_AMBULATORY_CARE_PROVIDER_SITE_OTHER): Payer: Medicare Other

## 2010-10-04 ENCOUNTER — Encounter: Payer: Self-pay | Admitting: Cardiovascular Disease

## 2010-10-04 DIAGNOSIS — Z7901 Long term (current) use of anticoagulants: Secondary | ICD-10-CM

## 2010-10-04 DIAGNOSIS — I4891 Unspecified atrial fibrillation: Secondary | ICD-10-CM

## 2010-10-04 LAB — CONVERTED CEMR LAB: POC INR: 5.2

## 2010-10-07 NOTE — Progress Notes (Signed)
Summary: On an antibiotic  Phone Note Call from Patient Call back at Home Phone [phone]   Caller: Self Call For: Whitney Hoffman Summary of Call: Pt is on an antibiotic for her foot.  Needs to know how to adjust coumadin. Initial call taken by: Harlon Flor,  October 01, 2010 12:07 PM  Follow-up for Phone Call        Called spoke with pt, they rx cephalexin 500mg  three times a day and sulfameth/TMP 1 three times a day started yesterday.  Advised pt may affect her Coumadin made OV on 10/04/10 in [place] to have Coumadin checked.  Follow-up by: Cloyde Reams RN,  October 01, 2010 1:51 PM
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
## 2010-10-13 ENCOUNTER — Encounter: Payer: Self-pay | Admitting: Cardiovascular Disease

## 2010-10-13 ENCOUNTER — Encounter (INDEPENDENT_AMBULATORY_CARE_PROVIDER_SITE_OTHER): Payer: Medicare Other

## 2010-10-13 DIAGNOSIS — I4891 Unspecified atrial fibrillation: Secondary | ICD-10-CM

## 2010-10-13 DIAGNOSIS — Z7901 Long term (current) use of anticoagulants: Secondary | ICD-10-CM

## 2010-10-13 LAB — CONVERTED CEMR LAB: POC INR: 2.3

## 2010-10-13 NOTE — Medication Information (Signed)
Summary: Coumadin Clinic  Anticoagulant Therapy  Managed by: Lanny Hurst, RN Referring MD: Shirlee Latch MD, Freida Busman PCP: Nilda Simmer, M.D. Supervising MD: Mariah Milling Indication 1: Atrial Fibrillation Lab Used: [facility]  Site: [place] INR POC 5.2 INR RANGE 2.0-3.0  Dietary changes: no    Health status changes: no    Bleeding/hemorrhagic complications: no    Recent/future hospitalizations: no    Any changes in medication regimen? yes       Details: Pt taking Sulfa (Bactrim), will be on for 3 more days.  Recent/future dental: no  Any missed doses?: no       Is patient compliant with meds? yes       Allergies: 1)  ! Codeine 2)  ! Demerol 3)  ! Morphine 4)  ! Valium 5)  ! Asa  Anticoagulation Management History:      The patient is taking warfarin and comes in today for a routine follow up visit.  Positive risk factors for bleeding include an age of 75 years or older.  The bleeding index is 'intermediate risk'.  Positive CHADS2 values include History of HTN and Age > 68 years old.  Anticoagulation responsible provider: G[place].  INR POC: 5.2.  Exp: 09/2011.    Anticoagulation Management Assessment/Plan:      The patient's current anticoagulation dose is Warfarin sodium 5 mg tabs: Take as directed by coumadin clinic..  The target INR is 2.0-3.0.  The next INR is due 10/13/2010.  Anticoagulation instructions were given to patient/daughter.  Results were reviewed/authorized by Lanny Hurst, RN.  She was notified by Lanny Hurst RN.         Prior Anticoagulation Instructions: INR 3.7  Hold today's dosage of Coumadin, then start taking 1 tablet daily except 1/2 tablet on Wednesdays and Saturdays.  Recheck in 2 weeks.    Current Anticoagulation Instructions: INR 5.2  HOLD Coumadin (Today) Monday, Tuesday, and Wednesday, then resume 1 tablet every day except 1/2 tablet on Wed and Sat. Keep appt next week.

## 2010-10-19 ENCOUNTER — Encounter: Payer: Self-pay | Admitting: Internal Medicine

## 2010-10-19 ENCOUNTER — Encounter (INDEPENDENT_AMBULATORY_CARE_PROVIDER_SITE_OTHER): Payer: Medicare Other | Admitting: Internal Medicine

## 2010-10-19 DIAGNOSIS — I495 Sick sinus syndrome: Secondary | ICD-10-CM

## 2010-10-19 DIAGNOSIS — I10 Essential (primary) hypertension: Secondary | ICD-10-CM

## 2010-10-19 NOTE — Medication Information (Signed)
Summary: rov/ewj  Anticoagulant Therapy  Managed by: Cloyde Reams, RN, BSN Referring MD: Shirlee Latch MD, Freida Busman PCP: Nilda Simmer, M.D. Supervising MD: Mariah Milling Indication 1: Atrial Fibrillation Lab Used: [facility] [place] Site: [place] INR POC 2.3 INR RANGE 2.0-3.0  Dietary changes: no    Health status changes: no    Bleeding/hemorrhagic complications: no    Recent/future hospitalizations: no    Any changes in medication regimen? yes       Details: Completed abx for cellulitis.   Recent/future dental: no  Any missed doses?: no       Is patient compliant with meds? yes       Allergies: 1)  ! Codeine 2)  ! Demerol 3)  ! Morphine 4)  ! Valium 5)  ! Asa  Anticoagulation Management History:      The patient is taking warfarin and comes in today for a routine follow up visit.  Positive risk factors for bleeding include an age of 75 years or older.  The bleeding index is 'intermediate risk'.  Positive CHADS2 values include History of HTN and Age > 75 years old.  Anticoagulation responsible provider: Gollan.  INR POC: 2.3.  Cuvette Lot#: [id number].  Exp: 08/2011.    Anticoagulation Management Assessment/Plan:      The patient's current anticoagulation dose is Warfarin sodium 5 mg tabs: Take as directed by coumadin clinic..  The target INR is 2.0-3.0.  The next INR is due 10/27/2010.  Anticoagulation instructions were given to patient/daughter.  Results were reviewed/authorized by Cloyde Reams, RN, BSN.  She was notified by Cloyde Reams RN.         Prior Anticoagulation Instructions: INR 5.2  HOLD Coumadin (Today) Monday, Tuesday, and Wednesday, then resume 1 tablet every day except 1/2 tablet on Wed and Sat. Keep appt next week.  Current Anticoagulation Instructions: INR 2.3  Continue on same dosage 1 tablet daily except 1/2 tablet on Wednesdays and Saturdays.  Recheck in 2 weeks.  Prescriptions: WARFARIN SODIUM 5 MG TABS (WARFARIN SODIUM) Take as directed  by coumadin clinic.  #35 x 3   Entered by:   Cloyde Reams RN   Authorized by:   Laren Boom, MD, [facility]   Signed by:   Cloyde Reams RN on 10/13/2010   Method used:   [facility] to        [facility] [phone]* (retail)       [address]       [place], [place]  [address]       Ph: [phone]       Fax: [phone]   RxID:   [id number]

## 2010-10-20 ENCOUNTER — Ambulatory Visit: Payer: Self-pay | Admitting: Family Medicine

## 2010-10-28 NOTE — Cardiovascular Report (Signed)
Summary: Office Visit   Office Visit   Imported By: Roderic Ovens 10/22/2010 12:47:48  _____________________________________________________________________  External Attachment:    Type:   Image     Comment:   External Document

## 2010-10-28 NOTE — Assessment & Plan Note (Signed)
Summary: F3M/AMD/Per Paula/pla   Visit Type:  Follow-up Primary Provider:  Nilda Simmer, M.D.  CC:  "Doing well.".  History of Present Illness: Mrs. Huq returns today for PPM followup. She is a pleasant 77 yo woman with a h/o symptomatic tachy-brady syndrome.  She underwent PPM several months ago. She has no additional atrial fibrillation and she has had no additional episodes of syncope.  No c/p, sob or peripheral edema.  Current Medications (verified): 1)  Metoprolol Succinate 25 Mg Xr24h-Tab (Metoprolol Succinate) .... One Tablet Once Daily 2)  Meclizine Hcl 25 Mg Tabs (Meclizine Hcl) .... One Tablet Three Times A Day As Needed 3)  Losartan Potassium 100 Mg Tabs (Losartan Potassium) .... One Tablet Every A.m. 4)  Vitamin D3 0400 Unit Caps (Cholecalciferol) .... One By Mouth Daily 5)  Vitamin B-12 500 Mcg Tabs (Cyanocobalamin) .... One Tablet Once Daily 6)  Lovastatin 20 Mg Tabs (Lovastatin) .... One Tablet At Bedtime 7)  Tylenol 8 Hour 650 Mg Cr-Tabs (Acetaminophen) .... As Needed 8)  Requip 0.5 Mg Tabs (Ropinirole Hcl) .... One Tablet Every 8 Hours As Needed 9)  Warfarin Sodium 5 Mg Tabs (Warfarin Sodium) .... Take As Directed By Coumadin Clinic.  Allergies (verified): 1)  ! Codeine 2)  ! Demerol 3)  ! Morphine 4)  ! Valium 5)  ! Jonne Ply  Past History:  Past Medical History: Last updated: 07/08/2010 1. Hypertension 2. Hyperlipidemia 3. Parkinson's disease 4. Atrial fibrillation  5. Tachy-brady syndrome with Medtronic dual chamber PCM  Past Surgical History: Last updated: 07/08/2010 appendectomy hysterectomy gallbladder surgery breast biopsy  disk surgery  hip surgery  Risk Factors: Smoking Status: never (07/08/2010)  Review of Systems  The patient denies chest pain, syncope, dyspnea on exertion, and peripheral edema.    Vital Signs:  Patient profile:   76 year old female Height:      63 inches Weight:      144 pounds BMI:     25.60 Pulse rate:   60 /  minute BP sitting:   134 / 80  (left arm) Cuff size:   regular  Vitals Entered By: Bishop Dublin, CMA (October 19, 2010 2:26 PM)  Physical Exam  General:  Well developed, well nourished, in no acute distress. Head:  normocephalic and atraumatic Mouth:  Teeth, gums and palate normal. Oral mucosa normal. Neck:  Neck supple, no JVD. No masses, thyromegaly or abnormal cervical nodes. Lungs:  Clear bilaterally to auscultation with no wheezes, rales, or rhonchi. Heart:  Non-displaced PMI, chest non-tender; regular rate and rhythm, S1, S2 without murmurs, rubs. +S4. Carotid upstroke normal, no bruit. Pedals normal pulses. No edema, no varicosities. Abdomen:  Bowel sounds positive; abdomen soft and non-tender without masses, organomegaly, or hernias noted. No hepatosplenomegaly. Extremities:  No clubbing or cyanosis. Neurologic:  Alert and oriented x 3.   PPM Specifications Following MD:  Lewayne Bunting, MD     PPM Vendor:  [employer]tr[employer]     PPM Model Number:  ADDR01     PPM Serial Number:  [id number] [facility] PPM DOI:  06/29/2010     PPM Implanting MD:  Lewayne Bunting, MD  Lead 1    Location: RA     DOI: 06/29/2010     Model #: 9147     Serial #: [id number]     Status: active Lead 2    Location: RV     DOI: 06/29/2010     Model #: 6578     Serial #: [id number]  Status: active  Magnet Response Rate:  BOL 85 ERI 65  Indications:  Sick sinus syndrome   PPM Follow Up Pacer Dependent:  No      Episodes Coumadin:  Yes  Parameters Mode:  DDDR     Lower Rate Limit:  60     Upper Rate Limit:  120 Paced AV Delay:  180     Sensed AV Delay:  150 MD Comments:  Normal device function.  Impression & Recommendations:  Problem # 1:  PACEMAKER, PERMANENT (ICD-V45.01) Her device is working normally. Will recheck in several months  Problem # 2:  ATRIAL FIBRILLATION (ICD-427.31) She is asymptomatic with no palpitations. The following medications were removed from the medication list:    Warfarin Sodium 5 Mg  Tabs (Warfarin sodium) .Marland Kitchen... Take as directed by coumadin clinic. Her updated medication list for this problem includes:    Metoprolol Succinate 25 Mg Xr24h-tab (Metoprolol succinate) ..... One tablet once daily  Problem # 3:  HYPERTENSION, UNSPECIFIED (ICD-401.9) Her blood pressure is reasonably well controlled. She will continue her current meds. The following medications were removed from the medication list:    Norvasc 10 Mg Tabs (Amlodipine besylate) ..... One tablet once daily Her updated medication list for this problem includes:    Metoprolol Succinate 25 Mg Xr24h-tab (Metoprolol succinate) ..... One tablet once daily    Losartan Potassium 100 Mg Tabs (Losartan potassium) ..... One tablet every a.m.  Patient Instructions: 1)  Your physician recommends that you schedule a follow-up appointment in: 9 months 2)  Your physician has recommended you make the following change in your medication: STOP Coumadin. AFTER 3 DAYS OF BEING OFF COUMADIN START PRADAXA 75MG  by mouth two times a day. Prescriptions: PRADAXA 75 MG CAPS (DABIGATRAN ETEXILATE MESYLATE) Take on tablet two times a day. Start 3 days after stopping Warfarin.  #60 x 6   Entered by:   Lanny Hurst RN   Authorized by:   Laren Boom, MD, [facility]   Signed by:   Lanny Hurst RN on 10/19/2010   Method used:   Electronically to        [facility] [phone]* (retail)       [address]       [place], [place]  [address]       Ph: [phone]       Fax: [phone]   RxID:   [id number]

## 2010-11-17 ENCOUNTER — Encounter: Payer: Self-pay | Admitting: Cardiology

## 2010-11-17 DIAGNOSIS — I4891 Unspecified atrial fibrillation: Secondary | ICD-10-CM

## 2010-12-20 ENCOUNTER — Other Ambulatory Visit: Payer: Self-pay | Admitting: Emergency Medicine

## 2010-12-20 MED ORDER — METOPROLOL SUCCINATE ER 25 MG PO TB24: 25.0000 mg | ORAL_TABLET | Freq: Every day | ORAL | Status: DC

## 2011-01-12 ENCOUNTER — Ambulatory Visit: Payer: Self-pay | Admitting: Cardiovascular Disease

## 2011-03-21 ENCOUNTER — Encounter: Payer: Self-pay | Admitting: Internal Medicine

## 2011-05-24 ENCOUNTER — Other Ambulatory Visit: Payer: Self-pay | Admitting: Internal Medicine

## 2011-05-24 MED ORDER — DABIGATRAN ETEXILATE MESYLATE 75 MG PO CAPS: 75.0000 mg | ORAL_CAPSULE | Freq: Two times a day (BID) | ORAL | Status: DC

## 2011-07-08 ENCOUNTER — Ambulatory Visit (INDEPENDENT_AMBULATORY_CARE_PROVIDER_SITE_OTHER): Payer: Medicare Other | Admitting: Internal Medicine

## 2011-07-08 ENCOUNTER — Encounter: Payer: Self-pay | Admitting: Internal Medicine

## 2011-07-08 VITALS — BP 124/70 | HR 67 | Ht 63.0 in | Wt 155.0 lb

## 2011-07-08 DIAGNOSIS — I4891 Unspecified atrial fibrillation: Secondary | ICD-10-CM

## 2011-07-08 DIAGNOSIS — I495 Sick sinus syndrome: Secondary | ICD-10-CM

## 2011-07-08 DIAGNOSIS — I10 Essential (primary) hypertension: Secondary | ICD-10-CM

## 2011-07-08 DIAGNOSIS — Z950 Presence of cardiac pacemaker: Secondary | ICD-10-CM

## 2011-07-08 LAB — PACEMAKER DEVICE OBSERVATION
AL AMPLITUDE: 4 mv
BAMS-0001: 175 {beats}/min
RV LEAD THRESHOLD: 0.625 V

## 2011-07-08 NOTE — Assessment & Plan Note (Signed)
Her blood pressure is well controlled today. She will continue her current medical therapy and maintain a low-sodium diet. 

## 2011-07-08 NOTE — Progress Notes (Signed)
HPI Mrs. Skufca returns today for followup. She is a very pleasant elderly woman with a history of symptomatic bradycardia, status post pacemaker insertion. She also has a history of Parkinson's disease. The patient has done well except for her tremor. She denies syncope or near-syncope. No chest pain or shortness of breath. No peripheral edema. Allergies  Allergen Reactions  . Aspirin     Sick on stomach  . Codeine     Feel dizzy  . Diazepam     Sick on stomach  . Meperidine Hcl     Sick on stomach   . Morphine     Nausea/sick on stomach     Current Outpatient Prescriptions  Medication Sig Dispense Refill  . acetaminophen (TYLENOL 8 HOUR) 650 MG CR tablet Take 650 mg by mouth as needed.        . Cholecalciferol (HM VITAMIN D3) 4000 UNITS CAPS Take by mouth.        . dabigatran (PRADAXA) 75 MG CAPS Take 1 capsule (75 mg total) by mouth every 12 (twelve) hours. Take one tablet 2 times a day. Start 3 days after stopping Warfarin.  60 capsule  3  . losartan (COZAAR) 100 MG tablet Take 100 mg by mouth every morning.        . lovastatin (MEVACOR) 20 MG tablet Take 20 mg by mouth at bedtime.        . meclizine (ANTIVERT) 25 MG tablet Take 25 mg by mouth 3 (three) times daily as needed.        . metoprolol succinate (TOPROL-XL) 25 MG 24 hr tablet Take 1 tablet (25 mg total) by mouth daily.  30 tablet  6  . rOPINIRole (REQUIP) 0.5 MG tablet Take 0.5 mg by mouth every 8 (eight) hours as needed.           Past Medical History  Diagnosis Date  . HTN (hypertension)   . HLD (hyperlipidemia)   . Parkinson disease   . Atrial fibrillation   . Tachy-brady syndrome     with medtronic dual chambe rPCM    ROS:   All systems reviewed and negative except as noted in the HPI.   Past Surgical History  Procedure Date  . Appendectomy   . Hysterectomy (other)   . Gallbladder surgery   . Breast biopsy   . Cervical disc surgery   . Hip surgery      Family History  Problem Relation Age of  Onset  . Diabetes Mother   . Coronary artery disease Father   . Heart attack Father      History   Social History  . Marital Status: Widowed    Spouse Name: N/A    Number of Children: N/A  . Years of Education: N/A   Occupational History  . Not on file.   Social History Main Topics  . Smoking status: Never Smoker   . Smokeless tobacco: Not on file   Comment: tobacco use- no   . Alcohol Use: No  . Drug Use: No  . Sexually Active:    Other Topics Concern  . Not on file   Social History Narrative   Lives in [place]. Widowed. Retired. Lives alone      BP 124/70  Pulse 67  Ht 5\' 3"  (1.6 m)  Wt 70.308 kg (155 lb)  BMI 27.46 kg/m2  Physical Exam:  Well appearing elderly woman, NAD HEENT: Unremarkable Neck:  No JVD, no thyromegally Lymphatics:  No adenopathy Back:  No  CVA tenderness Lungs:  Clear with no wheezes, rales, or rhonchi. HEART:  Regular rate rhythm, no murmurs, no rubs, no clicks Abd:  soft, positive bowel sounds, no organomegally, no rebound, no guarding Ext:  2 plus pulses, no edema, no cyanosis, no clubbing Skin:  No rashes no nodules Neuro:  CN II through XII intact, motor grossly intact. A resting tremor is present right greater than left.  DEVICE  Normal device function.  See PaceArt for details.   Assess/Plan:

## 2011-07-08 NOTE — Patient Instructions (Signed)
Your physician recommends that you schedule a follow-up appointment in: 1 year  

## 2011-07-08 NOTE — Assessment & Plan Note (Signed)
Interrogation of her pacemaker demonstrates that she has been in normal sinus rhythm approximately 99.5% of the time. She will continue her current medical therapy.

## 2011-07-08 NOTE — Assessment & Plan Note (Signed)
Her device is working normally. Plan to recheck in several months. 

## 2011-07-13 ENCOUNTER — Other Ambulatory Visit: Payer: Self-pay

## 2011-07-13 MED ORDER — METOPROLOL SUCCINATE ER 25 MG PO TB24: 25.0000 mg | ORAL_TABLET | Freq: Every day | ORAL | Status: DC

## 2011-08-24 DIAGNOSIS — M999 Biomechanical lesion, unspecified: Secondary | ICD-10-CM | POA: Diagnosis not present

## 2011-08-24 DIAGNOSIS — IMO0002 Reserved for concepts with insufficient information to code with codable children: Secondary | ICD-10-CM | POA: Diagnosis not present

## 2011-08-24 DIAGNOSIS — S336XXA Sprain of sacroiliac joint, initial encounter: Secondary | ICD-10-CM | POA: Diagnosis not present

## 2011-08-29 DIAGNOSIS — S336XXA Sprain of sacroiliac joint, initial encounter: Secondary | ICD-10-CM | POA: Diagnosis not present

## 2011-08-29 DIAGNOSIS — IMO0002 Reserved for concepts with insufficient information to code with codable children: Secondary | ICD-10-CM | POA: Diagnosis not present

## 2011-08-29 DIAGNOSIS — M999 Biomechanical lesion, unspecified: Secondary | ICD-10-CM | POA: Diagnosis not present

## 2011-09-12 DIAGNOSIS — M999 Biomechanical lesion, unspecified: Secondary | ICD-10-CM | POA: Diagnosis not present

## 2011-09-12 DIAGNOSIS — IMO0002 Reserved for concepts with insufficient information to code with codable children: Secondary | ICD-10-CM | POA: Diagnosis not present

## 2011-09-12 DIAGNOSIS — S336XXA Sprain of sacroiliac joint, initial encounter: Secondary | ICD-10-CM | POA: Diagnosis not present

## 2011-09-14 DIAGNOSIS — IMO0002 Reserved for concepts with insufficient information to code with codable children: Secondary | ICD-10-CM | POA: Diagnosis not present

## 2011-09-14 DIAGNOSIS — M999 Biomechanical lesion, unspecified: Secondary | ICD-10-CM | POA: Diagnosis not present

## 2011-09-14 DIAGNOSIS — S336XXA Sprain of sacroiliac joint, initial encounter: Secondary | ICD-10-CM | POA: Diagnosis not present

## 2011-09-19 DIAGNOSIS — IMO0002 Reserved for concepts with insufficient information to code with codable children: Secondary | ICD-10-CM | POA: Diagnosis not present

## 2011-09-19 DIAGNOSIS — S336XXA Sprain of sacroiliac joint, initial encounter: Secondary | ICD-10-CM | POA: Diagnosis not present

## 2011-09-19 DIAGNOSIS — M999 Biomechanical lesion, unspecified: Secondary | ICD-10-CM | POA: Diagnosis not present

## 2011-09-21 ENCOUNTER — Other Ambulatory Visit: Payer: Self-pay | Admitting: Internal Medicine

## 2011-09-21 DIAGNOSIS — G20 Parkinson's disease: Secondary | ICD-10-CM | POA: Diagnosis not present

## 2011-10-03 DIAGNOSIS — M999 Biomechanical lesion, unspecified: Secondary | ICD-10-CM | POA: Diagnosis not present

## 2011-10-03 DIAGNOSIS — S336XXA Sprain of sacroiliac joint, initial encounter: Secondary | ICD-10-CM | POA: Diagnosis not present

## 2011-10-03 DIAGNOSIS — IMO0002 Reserved for concepts with insufficient information to code with codable children: Secondary | ICD-10-CM | POA: Diagnosis not present

## 2011-10-06 ENCOUNTER — Encounter: Payer: Medicare Other | Admitting: *Deleted

## 2011-10-10 ENCOUNTER — Encounter: Payer: Self-pay | Admitting: *Deleted

## 2011-10-26 ENCOUNTER — Ambulatory Visit (INDEPENDENT_AMBULATORY_CARE_PROVIDER_SITE_OTHER): Payer: Medicare Other | Admitting: *Deleted

## 2011-10-26 DIAGNOSIS — Z950 Presence of cardiac pacemaker: Secondary | ICD-10-CM | POA: Diagnosis not present

## 2011-10-26 DIAGNOSIS — I4891 Unspecified atrial fibrillation: Secondary | ICD-10-CM | POA: Diagnosis not present

## 2011-10-27 ENCOUNTER — Encounter: Payer: Self-pay | Admitting: Internal Medicine

## 2011-10-28 LAB — REMOTE PACEMAKER DEVICE
AL IMPEDENCE PM: 590 Ohm
AL THRESHOLD: 0.5 V
ATRIAL PACING PM: 80
RV LEAD IMPEDENCE PM: 658 Ohm

## 2011-10-31 NOTE — Progress Notes (Signed)
Remote pacer check  

## 2011-11-07 DIAGNOSIS — M999 Biomechanical lesion, unspecified: Secondary | ICD-10-CM | POA: Diagnosis not present

## 2011-11-07 DIAGNOSIS — IMO0002 Reserved for concepts with insufficient information to code with codable children: Secondary | ICD-10-CM | POA: Diagnosis not present

## 2011-11-07 DIAGNOSIS — S336XXA Sprain of sacroiliac joint, initial encounter: Secondary | ICD-10-CM | POA: Diagnosis not present

## 2011-11-21 ENCOUNTER — Encounter: Payer: Self-pay | Admitting: *Deleted

## 2011-11-22 DIAGNOSIS — M810 Age-related osteoporosis without current pathological fracture: Secondary | ICD-10-CM | POA: Diagnosis not present

## 2011-11-22 DIAGNOSIS — I4891 Unspecified atrial fibrillation: Secondary | ICD-10-CM | POA: Diagnosis not present

## 2011-11-22 DIAGNOSIS — Z0000 Encounter for general adult medical examination without abnormal findings: Secondary | ICD-10-CM | POA: Diagnosis not present

## 2011-11-22 DIAGNOSIS — E7800 Pure hypercholesterolemia, unspecified: Secondary | ICD-10-CM | POA: Diagnosis not present

## 2011-12-05 DIAGNOSIS — S336XXA Sprain of sacroiliac joint, initial encounter: Secondary | ICD-10-CM | POA: Diagnosis not present

## 2011-12-05 DIAGNOSIS — IMO0002 Reserved for concepts with insufficient information to code with codable children: Secondary | ICD-10-CM | POA: Diagnosis not present

## 2011-12-05 DIAGNOSIS — M999 Biomechanical lesion, unspecified: Secondary | ICD-10-CM | POA: Diagnosis not present

## 2011-12-07 DIAGNOSIS — E7800 Pure hypercholesterolemia, unspecified: Secondary | ICD-10-CM | POA: Diagnosis not present

## 2012-01-02 DIAGNOSIS — IMO0002 Reserved for concepts with insufficient information to code with codable children: Secondary | ICD-10-CM | POA: Diagnosis not present

## 2012-01-02 DIAGNOSIS — M999 Biomechanical lesion, unspecified: Secondary | ICD-10-CM | POA: Diagnosis not present

## 2012-01-02 DIAGNOSIS — S336XXA Sprain of sacroiliac joint, initial encounter: Secondary | ICD-10-CM | POA: Diagnosis not present

## 2012-01-25 DIAGNOSIS — H3554 Dystrophies primarily involving the retinal pigment epithelium: Secondary | ICD-10-CM | POA: Diagnosis not present

## 2012-01-26 ENCOUNTER — Encounter: Payer: Medicare Other | Admitting: *Deleted

## 2012-02-06 ENCOUNTER — Encounter: Payer: Self-pay | Admitting: *Deleted

## 2012-02-06 ENCOUNTER — Other Ambulatory Visit: Payer: Self-pay | Admitting: Internal Medicine

## 2012-02-07 DIAGNOSIS — M999 Biomechanical lesion, unspecified: Secondary | ICD-10-CM | POA: Diagnosis not present

## 2012-02-07 DIAGNOSIS — S336XXA Sprain of sacroiliac joint, initial encounter: Secondary | ICD-10-CM | POA: Diagnosis not present

## 2012-02-07 DIAGNOSIS — IMO0002 Reserved for concepts with insufficient information to code with codable children: Secondary | ICD-10-CM | POA: Diagnosis not present

## 2012-02-22 ENCOUNTER — Encounter: Payer: Self-pay | Admitting: Internal Medicine

## 2012-02-22 ENCOUNTER — Ambulatory Visit (INDEPENDENT_AMBULATORY_CARE_PROVIDER_SITE_OTHER): Payer: Medicare Other | Admitting: *Deleted

## 2012-02-22 DIAGNOSIS — Z950 Presence of cardiac pacemaker: Secondary | ICD-10-CM | POA: Diagnosis not present

## 2012-02-22 DIAGNOSIS — I4891 Unspecified atrial fibrillation: Secondary | ICD-10-CM

## 2012-02-24 LAB — REMOTE PACEMAKER DEVICE
AL THRESHOLD: 0.5 V
BAMS-0001: 175 {beats}/min
BATTERY VOLTAGE: 2.79 V
RV LEAD AMPLITUDE: 11.2 mv
RV LEAD THRESHOLD: 0.625 V

## 2012-02-28 DIAGNOSIS — M999 Biomechanical lesion, unspecified: Secondary | ICD-10-CM | POA: Diagnosis not present

## 2012-02-28 DIAGNOSIS — IMO0002 Reserved for concepts with insufficient information to code with codable children: Secondary | ICD-10-CM | POA: Diagnosis not present

## 2012-02-28 DIAGNOSIS — S336XXA Sprain of sacroiliac joint, initial encounter: Secondary | ICD-10-CM | POA: Diagnosis not present

## 2012-03-16 ENCOUNTER — Encounter: Payer: Self-pay | Admitting: *Deleted

## 2012-03-26 DIAGNOSIS — IMO0002 Reserved for concepts with insufficient information to code with codable children: Secondary | ICD-10-CM | POA: Diagnosis not present

## 2012-03-26 DIAGNOSIS — M999 Biomechanical lesion, unspecified: Secondary | ICD-10-CM | POA: Diagnosis not present

## 2012-03-26 DIAGNOSIS — S336XXA Sprain of sacroiliac joint, initial encounter: Secondary | ICD-10-CM | POA: Diagnosis not present

## 2012-04-27 DIAGNOSIS — M810 Age-related osteoporosis without current pathological fracture: Secondary | ICD-10-CM | POA: Diagnosis not present

## 2012-04-27 DIAGNOSIS — E7800 Pure hypercholesterolemia, unspecified: Secondary | ICD-10-CM | POA: Diagnosis not present

## 2012-04-27 DIAGNOSIS — I4891 Unspecified atrial fibrillation: Secondary | ICD-10-CM | POA: Diagnosis not present

## 2012-04-27 DIAGNOSIS — R42 Dizziness and giddiness: Secondary | ICD-10-CM | POA: Diagnosis not present

## 2012-04-30 DIAGNOSIS — S336XXA Sprain of sacroiliac joint, initial encounter: Secondary | ICD-10-CM | POA: Diagnosis not present

## 2012-04-30 DIAGNOSIS — IMO0002 Reserved for concepts with insufficient information to code with codable children: Secondary | ICD-10-CM | POA: Diagnosis not present

## 2012-04-30 DIAGNOSIS — M999 Biomechanical lesion, unspecified: Secondary | ICD-10-CM | POA: Diagnosis not present

## 2012-05-22 DIAGNOSIS — E785 Hyperlipidemia, unspecified: Secondary | ICD-10-CM | POA: Diagnosis not present

## 2012-05-22 DIAGNOSIS — I4891 Unspecified atrial fibrillation: Secondary | ICD-10-CM | POA: Diagnosis not present

## 2012-05-22 DIAGNOSIS — Z23 Encounter for immunization: Secondary | ICD-10-CM | POA: Diagnosis not present

## 2012-05-22 DIAGNOSIS — M129 Arthropathy, unspecified: Secondary | ICD-10-CM | POA: Diagnosis not present

## 2012-05-22 DIAGNOSIS — I10 Essential (primary) hypertension: Secondary | ICD-10-CM | POA: Diagnosis not present

## 2012-06-04 DIAGNOSIS — M999 Biomechanical lesion, unspecified: Secondary | ICD-10-CM | POA: Diagnosis not present

## 2012-06-04 DIAGNOSIS — IMO0002 Reserved for concepts with insufficient information to code with codable children: Secondary | ICD-10-CM | POA: Diagnosis not present

## 2012-06-04 DIAGNOSIS — S336XXA Sprain of sacroiliac joint, initial encounter: Secondary | ICD-10-CM | POA: Diagnosis not present

## 2012-06-27 DIAGNOSIS — IMO0002 Reserved for concepts with insufficient information to code with codable children: Secondary | ICD-10-CM | POA: Diagnosis not present

## 2012-06-27 DIAGNOSIS — M999 Biomechanical lesion, unspecified: Secondary | ICD-10-CM | POA: Diagnosis not present

## 2012-06-27 DIAGNOSIS — S336XXA Sprain of sacroiliac joint, initial encounter: Secondary | ICD-10-CM | POA: Diagnosis not present

## 2012-07-24 ENCOUNTER — Other Ambulatory Visit: Payer: Self-pay | Admitting: Internal Medicine

## 2012-07-25 DIAGNOSIS — IMO0002 Reserved for concepts with insufficient information to code with codable children: Secondary | ICD-10-CM | POA: Diagnosis not present

## 2012-07-25 DIAGNOSIS — S336XXA Sprain of sacroiliac joint, initial encounter: Secondary | ICD-10-CM | POA: Diagnosis not present

## 2012-07-25 DIAGNOSIS — M999 Biomechanical lesion, unspecified: Secondary | ICD-10-CM | POA: Diagnosis not present

## 2012-08-01 ENCOUNTER — Encounter: Payer: Self-pay | Admitting: *Deleted

## 2012-08-07 ENCOUNTER — Encounter: Payer: Self-pay | Admitting: Internal Medicine

## 2012-08-07 ENCOUNTER — Ambulatory Visit (INDEPENDENT_AMBULATORY_CARE_PROVIDER_SITE_OTHER): Payer: Medicare Other | Admitting: Internal Medicine

## 2012-08-07 VITALS — BP 150/60 | HR 82 | Ht 63.0 in | Wt 157.0 lb

## 2012-08-07 DIAGNOSIS — I5032 Chronic diastolic (congestive) heart failure: Secondary | ICD-10-CM | POA: Diagnosis not present

## 2012-08-07 DIAGNOSIS — Z950 Presence of cardiac pacemaker: Secondary | ICD-10-CM

## 2012-08-07 DIAGNOSIS — I4891 Unspecified atrial fibrillation: Secondary | ICD-10-CM | POA: Diagnosis not present

## 2012-08-07 DIAGNOSIS — I10 Essential (primary) hypertension: Secondary | ICD-10-CM

## 2012-08-07 LAB — PACEMAKER DEVICE OBSERVATION
AL AMPLITUDE: 4 mv
AL IMPEDENCE PM: 529 Ohm
BATTERY VOLTAGE: 2.79 V
RV LEAD AMPLITUDE: 15.67 mv
RV LEAD IMPEDENCE PM: 626 Ohm

## 2012-08-07 MED ORDER — FUROSEMIDE 20 MG PO TABS: 20.0000 mg | ORAL_TABLET | Freq: Every day | ORAL | Status: DC

## 2012-08-07 NOTE — Progress Notes (Signed)
HPI Mr. Whitney Hoffman returns today for followup. She is a very pleasant 78 year old woman with a history of tachybrady syndrome, status post permanent pacemaker insertion. She also is a history of diastolic heart failure. She notes a mild cough in the interim and mild dyspnea. She also has COPD. No peripheral edema. Allergies  Allergen Reactions  . Aspirin     Sick on stomach  . Codeine     Feel dizzy  . Diazepam     Sick on stomach  . Meperidine Hcl     Sick on stomach   . Morphine     Nausea/sick on stomach     Current Outpatient Prescriptions  Medication Sig Dispense Refill  . acetaminophen (TYLENOL 8 HOUR) 650 MG CR tablet Take 650 mg by mouth as needed.        . Cholecalciferol (HM VITAMIN D3) 4000 UNITS CAPS Take by mouth.        . losartan (COZAAR) 100 MG tablet Take 100 mg by mouth every morning.        . lovastatin (MEVACOR) 20 MG tablet Take 20 mg by mouth at bedtime.        . meclizine (ANTIVERT) 25 MG tablet Take 25 mg by mouth 3 (three) times daily as needed.        . metoprolol succinate (TOPROL-XL) 25 MG 24 hr tablet TAKE 1 TABLET (25 MG TOTAL) BY MOUTH DAILY.  30 tablet  11  . PRADAXA 75 MG CAPS TAKE ONE CAPSULE BY MOUTH EVERY 12 HOURS *BEGIN 3 DAYS AFTER STOPPING WARFARIN  60 capsule  6  . furosemide (LASIX) 20 MG tablet Take 1 tablet (20 mg total) by mouth daily.  30 tablet  0     Past Medical History  Diagnosis Date  . HTN (hypertension)   . HLD (hyperlipidemia)   . Parkinson disease   . Atrial fibrillation   . Tachy-brady syndrome     with medtronic dual chambe rPCM    ROS:   All systems reviewed and negative except as noted in the HPI.   Past Surgical History  Procedure Date  . Appendectomy   . Hysterectomy (other)   . Gallbladder surgery   . Breast biopsy   . Cervical disc surgery   . Hip surgery      Family History  Problem Relation Age of Onset  . Diabetes Mother   . Coronary artery disease Father   . Heart attack Father      History    Social History  . Marital Status: Widowed    Spouse Name: N/A    Number of Children: N/A  . Years of Education: N/A   Occupational History  . Not on file.   Social History Main Topics  . Smoking status: Never Smoker   . Smokeless tobacco: Not on file     Comment: tobacco use- no   . Alcohol Use: No  . Drug Use: No  . Sexually Active:    Other Topics Concern  . Not on file   Social History Narrative   Lives in [place]. Widowed. Retired. Lives alone      BP 150/60  Pulse 82  Ht 5\' 3"  (1.6 m)  Wt 157 lb (71.215 kg)  BMI 27.81 kg/m2  Physical Exam:  Well appearing elderly woman, NAD HEENT: Unremarkable Neck:  7 cm JVD, no thyromegally Lungs:  Clear except for rales approximately 1/4 the way up bilaterally, no wheezes or rhonchi. HEART:  Regular rate rhythm, no murmurs,  no rubs, no clicks Abd:  soft, positive bowel sounds, no organomegally, no rebound, no guarding Ext:  2 plus pulses, no edema, no cyanosis, no clubbing Skin:  No rashes no nodules Neuro:  CN II through XII intact, motor grossly intact  EKG normal sinus rhythm with atrial pacing  DEVICE  Normal device function.  See PaceArt for details.   Assess/Plan:

## 2012-08-07 NOTE — Assessment & Plan Note (Signed)
Her Medtronic dual-chamber pacemaker is working normally. We'll plan to recheck in several months. 

## 2012-08-07 NOTE — Patient Instructions (Signed)
Start on Lasix 20 mg take one tablet daily; if breathing is not any better after 4 days then need to stop taking the Lasix. Need to call the office if not any better.  Need to follow up with Dr. Ladona Ridgel in 12 months. Need to send a Care link transmission on November 12, 2012.

## 2012-08-07 NOTE — Assessment & Plan Note (Signed)
She appears to be maintaining sinus rhythm very nicely. No change in medical therapy today.

## 2012-08-07 NOTE — Assessment & Plan Note (Signed)
The patient has some dyspnea with rales on exam. We have recommended several days of diuretic therapy to see if this improves her symptoms. If not, she is instructed to stop her diuretic. If her symptoms are better, she will continue low-dose diuretic and maintain a low-sodium diet.

## 2012-08-09 DIAGNOSIS — H35349 Macular cyst, hole, or pseudohole, unspecified eye: Secondary | ICD-10-CM | POA: Diagnosis not present

## 2012-08-09 DIAGNOSIS — H04129 Dry eye syndrome of unspecified lacrimal gland: Secondary | ICD-10-CM | POA: Diagnosis not present

## 2012-08-09 DIAGNOSIS — H3554 Dystrophies primarily involving the retinal pigment epithelium: Secondary | ICD-10-CM | POA: Diagnosis not present

## 2012-08-21 DIAGNOSIS — S336XXA Sprain of sacroiliac joint, initial encounter: Secondary | ICD-10-CM | POA: Diagnosis not present

## 2012-08-21 DIAGNOSIS — IMO0002 Reserved for concepts with insufficient information to code with codable children: Secondary | ICD-10-CM | POA: Diagnosis not present

## 2012-08-21 DIAGNOSIS — M999 Biomechanical lesion, unspecified: Secondary | ICD-10-CM | POA: Diagnosis not present

## 2012-09-21 DIAGNOSIS — M999 Biomechanical lesion, unspecified: Secondary | ICD-10-CM | POA: Diagnosis not present

## 2012-09-21 DIAGNOSIS — S336XXA Sprain of sacroiliac joint, initial encounter: Secondary | ICD-10-CM | POA: Diagnosis not present

## 2012-09-21 DIAGNOSIS — IMO0002 Reserved for concepts with insufficient information to code with codable children: Secondary | ICD-10-CM | POA: Diagnosis not present

## 2012-10-01 ENCOUNTER — Other Ambulatory Visit: Payer: Self-pay | Admitting: Internal Medicine

## 2012-10-16 DIAGNOSIS — M999 Biomechanical lesion, unspecified: Secondary | ICD-10-CM | POA: Diagnosis not present

## 2012-10-16 DIAGNOSIS — I10 Essential (primary) hypertension: Secondary | ICD-10-CM | POA: Diagnosis not present

## 2012-10-16 DIAGNOSIS — E7800 Pure hypercholesterolemia, unspecified: Secondary | ICD-10-CM | POA: Diagnosis not present

## 2012-11-12 ENCOUNTER — Encounter: Payer: Medicare Other | Admitting: *Deleted

## 2012-11-13 DIAGNOSIS — IMO0002 Reserved for concepts with insufficient information to code with codable children: Secondary | ICD-10-CM | POA: Diagnosis not present

## 2012-11-13 DIAGNOSIS — M999 Biomechanical lesion, unspecified: Secondary | ICD-10-CM | POA: Diagnosis not present

## 2012-11-13 DIAGNOSIS — S336XXA Sprain of sacroiliac joint, initial encounter: Secondary | ICD-10-CM | POA: Diagnosis not present

## 2012-11-16 DIAGNOSIS — E7800 Pure hypercholesterolemia, unspecified: Secondary | ICD-10-CM | POA: Diagnosis not present

## 2012-11-16 DIAGNOSIS — M810 Age-related osteoporosis without current pathological fracture: Secondary | ICD-10-CM | POA: Diagnosis not present

## 2012-11-16 DIAGNOSIS — I10 Essential (primary) hypertension: Secondary | ICD-10-CM | POA: Diagnosis not present

## 2012-11-16 DIAGNOSIS — I4891 Unspecified atrial fibrillation: Secondary | ICD-10-CM | POA: Diagnosis not present

## 2012-11-22 ENCOUNTER — Encounter: Payer: Self-pay | Admitting: *Deleted

## 2012-12-06 ENCOUNTER — Ambulatory Visit (INDEPENDENT_AMBULATORY_CARE_PROVIDER_SITE_OTHER): Payer: Medicare Other | Admitting: *Deleted

## 2012-12-06 ENCOUNTER — Encounter: Payer: Self-pay | Admitting: Internal Medicine

## 2012-12-06 ENCOUNTER — Other Ambulatory Visit: Payer: Self-pay | Admitting: Internal Medicine

## 2012-12-06 DIAGNOSIS — Z950 Presence of cardiac pacemaker: Secondary | ICD-10-CM | POA: Diagnosis not present

## 2012-12-06 DIAGNOSIS — I4891 Unspecified atrial fibrillation: Secondary | ICD-10-CM | POA: Diagnosis not present

## 2012-12-11 DIAGNOSIS — M999 Biomechanical lesion, unspecified: Secondary | ICD-10-CM | POA: Diagnosis not present

## 2012-12-11 DIAGNOSIS — IMO0002 Reserved for concepts with insufficient information to code with codable children: Secondary | ICD-10-CM | POA: Diagnosis not present

## 2012-12-11 DIAGNOSIS — S336XXA Sprain of sacroiliac joint, initial encounter: Secondary | ICD-10-CM | POA: Diagnosis not present

## 2012-12-17 LAB — REMOTE PACEMAKER DEVICE
AL THRESHOLD: 0.5 V
ATRIAL PACING PM: 56
BAMS-0001: 175 {beats}/min
RV LEAD THRESHOLD: 0.625 V
VENTRICULAR PACING PM: 3

## 2012-12-18 DIAGNOSIS — Z1339 Encounter for screening examination for other mental health and behavioral disorders: Secondary | ICD-10-CM | POA: Diagnosis not present

## 2012-12-18 DIAGNOSIS — Z0000 Encounter for general adult medical examination without abnormal findings: Secondary | ICD-10-CM | POA: Diagnosis not present

## 2012-12-18 DIAGNOSIS — R5383 Other fatigue: Secondary | ICD-10-CM | POA: Diagnosis not present

## 2012-12-18 DIAGNOSIS — Z1331 Encounter for screening for depression: Secondary | ICD-10-CM | POA: Diagnosis not present

## 2012-12-18 DIAGNOSIS — I10 Essential (primary) hypertension: Secondary | ICD-10-CM | POA: Diagnosis not present

## 2012-12-18 DIAGNOSIS — Z23 Encounter for immunization: Secondary | ICD-10-CM | POA: Diagnosis not present

## 2012-12-18 DIAGNOSIS — I4891 Unspecified atrial fibrillation: Secondary | ICD-10-CM | POA: Diagnosis not present

## 2013-01-08 DIAGNOSIS — S336XXA Sprain of sacroiliac joint, initial encounter: Secondary | ICD-10-CM | POA: Diagnosis not present

## 2013-01-08 DIAGNOSIS — M999 Biomechanical lesion, unspecified: Secondary | ICD-10-CM | POA: Diagnosis not present

## 2013-01-08 DIAGNOSIS — IMO0002 Reserved for concepts with insufficient information to code with codable children: Secondary | ICD-10-CM | POA: Diagnosis not present

## 2013-01-10 ENCOUNTER — Encounter: Payer: Self-pay | Admitting: *Deleted

## 2013-01-17 DIAGNOSIS — G20 Parkinson's disease: Secondary | ICD-10-CM | POA: Diagnosis not present

## 2013-02-13 DIAGNOSIS — IMO0002 Reserved for concepts with insufficient information to code with codable children: Secondary | ICD-10-CM | POA: Diagnosis not present

## 2013-02-13 DIAGNOSIS — S336XXA Sprain of sacroiliac joint, initial encounter: Secondary | ICD-10-CM | POA: Diagnosis not present

## 2013-02-13 DIAGNOSIS — M999 Biomechanical lesion, unspecified: Secondary | ICD-10-CM | POA: Diagnosis not present

## 2013-02-18 DIAGNOSIS — M999 Biomechanical lesion, unspecified: Secondary | ICD-10-CM | POA: Diagnosis not present

## 2013-02-18 DIAGNOSIS — IMO0002 Reserved for concepts with insufficient information to code with codable children: Secondary | ICD-10-CM | POA: Diagnosis not present

## 2013-02-18 DIAGNOSIS — S336XXA Sprain of sacroiliac joint, initial encounter: Secondary | ICD-10-CM | POA: Diagnosis not present

## 2013-02-20 DIAGNOSIS — IMO0002 Reserved for concepts with insufficient information to code with codable children: Secondary | ICD-10-CM | POA: Diagnosis not present

## 2013-02-20 DIAGNOSIS — M999 Biomechanical lesion, unspecified: Secondary | ICD-10-CM | POA: Diagnosis not present

## 2013-02-20 DIAGNOSIS — M5137 Other intervertebral disc degeneration, lumbosacral region: Secondary | ICD-10-CM | POA: Diagnosis not present

## 2013-02-27 DIAGNOSIS — M999 Biomechanical lesion, unspecified: Secondary | ICD-10-CM | POA: Diagnosis not present

## 2013-02-27 DIAGNOSIS — M5137 Other intervertebral disc degeneration, lumbosacral region: Secondary | ICD-10-CM | POA: Diagnosis not present

## 2013-02-27 DIAGNOSIS — IMO0002 Reserved for concepts with insufficient information to code with codable children: Secondary | ICD-10-CM | POA: Diagnosis not present

## 2013-03-11 ENCOUNTER — Ambulatory Visit (INDEPENDENT_AMBULATORY_CARE_PROVIDER_SITE_OTHER): Payer: Medicare Other | Admitting: *Deleted

## 2013-03-11 ENCOUNTER — Encounter: Payer: Self-pay | Admitting: Internal Medicine

## 2013-03-11 DIAGNOSIS — I4891 Unspecified atrial fibrillation: Secondary | ICD-10-CM

## 2013-03-11 DIAGNOSIS — Z950 Presence of cardiac pacemaker: Secondary | ICD-10-CM

## 2013-03-12 DIAGNOSIS — I10 Essential (primary) hypertension: Secondary | ICD-10-CM | POA: Diagnosis not present

## 2013-03-12 DIAGNOSIS — M810 Age-related osteoporosis without current pathological fracture: Secondary | ICD-10-CM | POA: Diagnosis not present

## 2013-03-12 DIAGNOSIS — Z1331 Encounter for screening for depression: Secondary | ICD-10-CM | POA: Diagnosis not present

## 2013-03-12 DIAGNOSIS — Z23 Encounter for immunization: Secondary | ICD-10-CM | POA: Diagnosis not present

## 2013-03-13 DIAGNOSIS — IMO0002 Reserved for concepts with insufficient information to code with codable children: Secondary | ICD-10-CM | POA: Diagnosis not present

## 2013-03-13 DIAGNOSIS — M999 Biomechanical lesion, unspecified: Secondary | ICD-10-CM | POA: Diagnosis not present

## 2013-03-13 DIAGNOSIS — M5137 Other intervertebral disc degeneration, lumbosacral region: Secondary | ICD-10-CM | POA: Diagnosis not present

## 2013-03-13 LAB — REMOTE PACEMAKER DEVICE
AL IMPEDENCE PM: 554 Ohm
AL THRESHOLD: 0.5 V
ATRIAL PACING PM: 55
BATTERY VOLTAGE: 2.79 V
RV LEAD AMPLITUDE: 22.4 mv
RV LEAD IMPEDENCE PM: 624 Ohm

## 2013-03-20 DIAGNOSIS — H35349 Macular cyst, hole, or pseudohole, unspecified eye: Secondary | ICD-10-CM | POA: Diagnosis not present

## 2013-03-20 DIAGNOSIS — H04129 Dry eye syndrome of unspecified lacrimal gland: Secondary | ICD-10-CM | POA: Diagnosis not present

## 2013-03-20 DIAGNOSIS — H3554 Dystrophies primarily involving the retinal pigment epithelium: Secondary | ICD-10-CM | POA: Diagnosis not present

## 2013-04-10 DIAGNOSIS — IMO0002 Reserved for concepts with insufficient information to code with codable children: Secondary | ICD-10-CM | POA: Diagnosis not present

## 2013-04-10 DIAGNOSIS — M5137 Other intervertebral disc degeneration, lumbosacral region: Secondary | ICD-10-CM | POA: Diagnosis not present

## 2013-04-10 DIAGNOSIS — M999 Biomechanical lesion, unspecified: Secondary | ICD-10-CM | POA: Diagnosis not present

## 2013-04-12 ENCOUNTER — Encounter: Payer: Self-pay | Admitting: *Deleted

## 2013-04-18 DIAGNOSIS — G20 Parkinson's disease: Secondary | ICD-10-CM | POA: Diagnosis not present

## 2013-05-08 DIAGNOSIS — IMO0002 Reserved for concepts with insufficient information to code with codable children: Secondary | ICD-10-CM | POA: Diagnosis not present

## 2013-05-08 DIAGNOSIS — M999 Biomechanical lesion, unspecified: Secondary | ICD-10-CM | POA: Diagnosis not present

## 2013-05-08 DIAGNOSIS — M5137 Other intervertebral disc degeneration, lumbosacral region: Secondary | ICD-10-CM | POA: Diagnosis not present

## 2013-05-14 ENCOUNTER — Other Ambulatory Visit: Payer: Self-pay | Admitting: *Deleted

## 2013-05-14 ENCOUNTER — Telehealth: Payer: Self-pay | Admitting: *Deleted

## 2013-05-14 MED ORDER — DABIGATRAN ETEXILATE MESYLATE 75 MG PO CAPS: ORAL_CAPSULE | ORAL | Status: DC

## 2013-05-14 NOTE — Telephone Encounter (Signed)
Patient request all her medications go to [facility] in [place]

## 2013-05-14 NOTE — Telephone Encounter (Signed)
Patient called requesting a refill of Pradaxa.

## 2013-06-12 DIAGNOSIS — M999 Biomechanical lesion, unspecified: Secondary | ICD-10-CM | POA: Diagnosis not present

## 2013-06-12 DIAGNOSIS — IMO0002 Reserved for concepts with insufficient information to code with codable children: Secondary | ICD-10-CM | POA: Diagnosis not present

## 2013-06-12 DIAGNOSIS — M5137 Other intervertebral disc degeneration, lumbosacral region: Secondary | ICD-10-CM | POA: Diagnosis not present

## 2013-06-13 DIAGNOSIS — G20 Parkinson's disease: Secondary | ICD-10-CM | POA: Diagnosis not present

## 2013-06-17 ENCOUNTER — Ambulatory Visit (INDEPENDENT_AMBULATORY_CARE_PROVIDER_SITE_OTHER): Payer: Medicare Other | Admitting: *Deleted

## 2013-06-17 DIAGNOSIS — Z950 Presence of cardiac pacemaker: Secondary | ICD-10-CM

## 2013-06-17 DIAGNOSIS — I4891 Unspecified atrial fibrillation: Secondary | ICD-10-CM | POA: Diagnosis not present

## 2013-06-19 ENCOUNTER — Encounter: Payer: Self-pay | Admitting: Internal Medicine

## 2013-06-21 LAB — REMOTE PACEMAKER DEVICE
AL AMPLITUDE: 2.8 mv
BAMS-0001: 175 {beats}/min
BATTERY VOLTAGE: 2.79 V
RV LEAD AMPLITUDE: 22.4 mv
RV LEAD THRESHOLD: 0.75 V

## 2013-06-28 ENCOUNTER — Encounter: Payer: Self-pay | Admitting: *Deleted

## 2013-07-16 DIAGNOSIS — M5137 Other intervertebral disc degeneration, lumbosacral region: Secondary | ICD-10-CM | POA: Diagnosis not present

## 2013-07-16 DIAGNOSIS — IMO0002 Reserved for concepts with insufficient information to code with codable children: Secondary | ICD-10-CM | POA: Diagnosis not present

## 2013-07-16 DIAGNOSIS — M999 Biomechanical lesion, unspecified: Secondary | ICD-10-CM | POA: Diagnosis not present

## 2013-07-26 ENCOUNTER — Other Ambulatory Visit: Payer: Self-pay | Admitting: *Deleted

## 2013-07-26 MED ORDER — METOPROLOL SUCCINATE ER 25 MG PO TB24: ORAL_TABLET | ORAL | Status: DC

## 2013-08-08 ENCOUNTER — Ambulatory Visit (INDEPENDENT_AMBULATORY_CARE_PROVIDER_SITE_OTHER): Payer: Medicare Other | Admitting: Internal Medicine

## 2013-08-08 ENCOUNTER — Encounter: Payer: Self-pay | Admitting: Internal Medicine

## 2013-08-08 VITALS — BP 148/80 | HR 66 | Ht 63.0 in | Wt 158.0 lb

## 2013-08-08 DIAGNOSIS — I4891 Unspecified atrial fibrillation: Secondary | ICD-10-CM | POA: Diagnosis not present

## 2013-08-08 DIAGNOSIS — R0789 Other chest pain: Secondary | ICD-10-CM

## 2013-08-08 DIAGNOSIS — I5032 Chronic diastolic (congestive) heart failure: Secondary | ICD-10-CM

## 2013-08-08 DIAGNOSIS — R0609 Other forms of dyspnea: Secondary | ICD-10-CM

## 2013-08-08 LAB — MDC_IDC_ENUM_SESS_TYPE_INCLINIC
Battery Impedance: 174 Ohm
Battery Remaining Longevity: 118 mo
Battery Voltage: 2.79 V
Brady Statistic AP VS Percent: 60 %
Brady Statistic AS VP Percent: 1 %
Date Time Interrogation Session: 20141218114457
Lead Channel Impedance Value: 536 Ohm
Lead Channel Pacing Threshold Amplitude: 0.5 V
Lead Channel Pacing Threshold Amplitude: 0.75 V
Lead Channel Pacing Threshold Pulse Width: 0.4 ms
Lead Channel Sensing Intrinsic Amplitude: 15.67 mV
Lead Channel Setting Pacing Amplitude: 2.5 V
Lead Channel Setting Pacing Pulse Width: 0.4 ms

## 2013-08-08 NOTE — Progress Notes (Signed)
Patient Care Team: Mila Merry, MD as PCP - General (Family Medicine) Mila Merry, MD as Referring Physician (Family Medicine)   HPI  Whitney Hoffman is a 79 y.o. female Seen in followup for tachybradycardia syndrome with prior pacemaker insertion.  She has significant complaints of exercise intolerance. She's had problems with edema in the past with her antiparkinson medication; this is better since these have been discontinued  She has a history of HFpEF and COPD  Past Medical History  Diagnosis Date  . HTN (hypertension)   . HLD (hyperlipidemia)   . Parkinson disease   . Atrial fibrillation   . Tachy-brady syndrome     with medtronic dual chambe rPCM    Past Surgical History  Procedure Laterality Date  . Appendectomy    . Hysterectomy (other)    . Gallbladder surgery    . Breast biopsy    . Cervical disc surgery    . Hip surgery      Current Outpatient Prescriptions  Medication Sig Dispense Refill  . acetaminophen (TYLENOL 8 HOUR) 650 MG CR tablet Take 650 mg by mouth as needed.        . Cholecalciferol (HM VITAMIN D3) 4000 UNITS CAPS Take 2,000 Units by mouth daily.       . Coenzyme Q10 (CO Q-10) 200 MG CAPS Take 200 mg by mouth 2 (two) times daily.      . dabigatran (PRADAXA) 75 MG CAPS capsule Take twice daily  60 capsule  6  . losartan (COZAAR) 100 MG tablet Take 100 mg by mouth every morning.        . lovastatin (MEVACOR) 20 MG tablet Take 20 mg by mouth at bedtime.        . meclizine (ANTIVERT) 25 MG tablet Take 25 mg by mouth 3 (three) times daily as needed.        . metoprolol succinate (TOPROL-XL) 25 MG 24 hr tablet TAKE 1 TABLET (25 MG TOTAL) BY MOUTH DAILY.  30 tablet  3  . Misc Natural Products (JOINT SUPPORT COMPLEX PO) Take by mouth 2 (two) times daily.      . Multiple Vitamin (MULTIVITAMIN) capsule Take 1 capsule by mouth daily.      . NON FORMULARY Cell activator twice a day.       No current facility-administered medications for this  visit.    Allergies  Allergen Reactions  . Aspirin     Sick on stomach  . Codeine     Feel dizzy  . Diazepam     Sick on stomach  . Meperidine Hcl     Sick on stomach   . Morphine     Nausea/sick on stomach    Review of Systems negative except from HPI and PMH  Physical Exam BP 148/80  Pulse 66  Ht 5\' 3"  (1.6 m)  Wt 158 lb (71.668 kg)  BMI 28.00 kg/m2 Well developed and well nourished in no acute distress HENT normal E scleral and icterus clear Neck Supple JVP8 carotids brisk and full Device pocket well healed; without hematoma or erythema.  There is no tethering  Clear to ausculation Regular rate and rhythm, no murmurs gallops or rub Soft with active bowel sounds No clubbing cyanosis no edema Alert and oriented, grossly normal motor and sensory function Skin Warm and Dry  ECG demonstrated sinus rhythm with a synchronous pacing although it is ready ECG at first thought of atrial flutter.this was a consequence of her current Assessment  and  Plan

## 2013-08-08 NOTE — Assessment & Plan Note (Signed)
No significant atrial fibrillation

## 2013-08-08 NOTE — Assessment & Plan Note (Signed)
This may be a manifestation of HFpEF although she does not have significant volume overload today. Evaluation of her heart rate histogram suggests that there may be excessive heart rate with about 10% of her heart beats faster than 100 beats per minute some of these are paced. Hence, we will reprogram her rate sensor. She may need augmented rate control and was see how she does with the first nonpharmacological intervention prior to adding more medicines.

## 2013-08-08 NOTE — Patient Instructions (Signed)
Your physician recommends that you schedule a follow-up appointment in: 3 months.  

## 2013-08-08 NOTE — Assessment & Plan Note (Signed)
As above.

## 2013-08-12 DIAGNOSIS — M999 Biomechanical lesion, unspecified: Secondary | ICD-10-CM | POA: Diagnosis not present

## 2013-08-12 DIAGNOSIS — IMO0002 Reserved for concepts with insufficient information to code with codable children: Secondary | ICD-10-CM | POA: Diagnosis not present

## 2013-08-12 DIAGNOSIS — M5137 Other intervertebral disc degeneration, lumbosacral region: Secondary | ICD-10-CM | POA: Diagnosis not present

## 2013-08-19 DIAGNOSIS — M549 Dorsalgia, unspecified: Secondary | ICD-10-CM | POA: Diagnosis not present

## 2013-08-19 DIAGNOSIS — G20 Parkinson's disease: Secondary | ICD-10-CM | POA: Diagnosis not present

## 2013-09-09 DIAGNOSIS — I10 Essential (primary) hypertension: Secondary | ICD-10-CM | POA: Diagnosis not present

## 2013-09-09 DIAGNOSIS — M999 Biomechanical lesion, unspecified: Secondary | ICD-10-CM | POA: Diagnosis not present

## 2013-09-09 DIAGNOSIS — E785 Hyperlipidemia, unspecified: Secondary | ICD-10-CM | POA: Diagnosis not present

## 2013-09-09 DIAGNOSIS — M5137 Other intervertebral disc degeneration, lumbosacral region: Secondary | ICD-10-CM | POA: Diagnosis not present

## 2013-09-09 DIAGNOSIS — M810 Age-related osteoporosis without current pathological fracture: Secondary | ICD-10-CM | POA: Diagnosis not present

## 2013-09-09 DIAGNOSIS — I4891 Unspecified atrial fibrillation: Secondary | ICD-10-CM | POA: Diagnosis not present

## 2013-09-09 DIAGNOSIS — IMO0002 Reserved for concepts with insufficient information to code with codable children: Secondary | ICD-10-CM | POA: Diagnosis not present

## 2013-09-23 DIAGNOSIS — E756 Lipid storage disorder, unspecified: Secondary | ICD-10-CM | POA: Diagnosis not present

## 2013-10-16 ENCOUNTER — Ambulatory Visit: Payer: Self-pay | Admitting: Family Medicine

## 2013-10-16 DIAGNOSIS — M899 Disorder of bone, unspecified: Secondary | ICD-10-CM | POA: Diagnosis not present

## 2013-10-16 DIAGNOSIS — M810 Age-related osteoporosis without current pathological fracture: Secondary | ICD-10-CM | POA: Diagnosis not present

## 2013-10-28 DIAGNOSIS — IMO0002 Reserved for concepts with insufficient information to code with codable children: Secondary | ICD-10-CM | POA: Diagnosis not present

## 2013-10-28 DIAGNOSIS — M5137 Other intervertebral disc degeneration, lumbosacral region: Secondary | ICD-10-CM | POA: Diagnosis not present

## 2013-10-28 DIAGNOSIS — M999 Biomechanical lesion, unspecified: Secondary | ICD-10-CM | POA: Diagnosis not present

## 2013-10-29 DIAGNOSIS — M5137 Other intervertebral disc degeneration, lumbosacral region: Secondary | ICD-10-CM | POA: Diagnosis not present

## 2013-10-29 DIAGNOSIS — IMO0002 Reserved for concepts with insufficient information to code with codable children: Secondary | ICD-10-CM | POA: Diagnosis not present

## 2013-10-29 DIAGNOSIS — M999 Biomechanical lesion, unspecified: Secondary | ICD-10-CM | POA: Diagnosis not present

## 2013-11-01 DIAGNOSIS — M5137 Other intervertebral disc degeneration, lumbosacral region: Secondary | ICD-10-CM | POA: Diagnosis not present

## 2013-11-01 DIAGNOSIS — IMO0002 Reserved for concepts with insufficient information to code with codable children: Secondary | ICD-10-CM | POA: Diagnosis not present

## 2013-11-01 DIAGNOSIS — M999 Biomechanical lesion, unspecified: Secondary | ICD-10-CM | POA: Diagnosis not present

## 2013-11-04 DIAGNOSIS — IMO0002 Reserved for concepts with insufficient information to code with codable children: Secondary | ICD-10-CM | POA: Diagnosis not present

## 2013-11-04 DIAGNOSIS — M999 Biomechanical lesion, unspecified: Secondary | ICD-10-CM | POA: Diagnosis not present

## 2013-11-04 DIAGNOSIS — M5137 Other intervertebral disc degeneration, lumbosacral region: Secondary | ICD-10-CM | POA: Diagnosis not present

## 2013-11-11 DIAGNOSIS — M999 Biomechanical lesion, unspecified: Secondary | ICD-10-CM | POA: Diagnosis not present

## 2013-11-11 DIAGNOSIS — M5137 Other intervertebral disc degeneration, lumbosacral region: Secondary | ICD-10-CM | POA: Diagnosis not present

## 2013-11-11 DIAGNOSIS — IMO0002 Reserved for concepts with insufficient information to code with codable children: Secondary | ICD-10-CM | POA: Diagnosis not present

## 2013-11-12 ENCOUNTER — Encounter: Payer: Self-pay | Admitting: Internal Medicine

## 2013-11-12 ENCOUNTER — Ambulatory Visit (INDEPENDENT_AMBULATORY_CARE_PROVIDER_SITE_OTHER): Payer: Medicare Other | Admitting: Internal Medicine

## 2013-11-12 VITALS — BP 132/70 | HR 73 | Ht 62.0 in | Wt 155.0 lb

## 2013-11-12 DIAGNOSIS — R0789 Other chest pain: Secondary | ICD-10-CM

## 2013-11-12 DIAGNOSIS — I4891 Unspecified atrial fibrillation: Secondary | ICD-10-CM | POA: Diagnosis not present

## 2013-11-12 DIAGNOSIS — R0602 Shortness of breath: Secondary | ICD-10-CM | POA: Diagnosis not present

## 2013-11-12 LAB — MDC_IDC_ENUM_SESS_TYPE_INCLINIC
Battery Voltage: 2.79 V
Brady Statistic AP VP Percent: 1 %
Brady Statistic AP VS Percent: 81 %
Brady Statistic AS VP Percent: 0 %
Brady Statistic AS VS Percent: 19 %
Date Time Interrogation Session: 20150324085642
Lead Channel Impedance Value: 528 Ohm
Lead Channel Pacing Threshold Amplitude: 0.5 V
Lead Channel Pacing Threshold Amplitude: 0.75 V
Lead Channel Pacing Threshold Pulse Width: 0.4 ms
Lead Channel Pacing Threshold Pulse Width: 0.4 ms
Lead Channel Sensing Intrinsic Amplitude: 15.67 mV
Lead Channel Setting Sensing Sensitivity: 5.6 mV
MDC IDC MSMT BATTERY IMPEDANCE: 223 Ohm
MDC IDC MSMT BATTERY REMAINING LONGEVITY: 110 mo
MDC IDC MSMT LEADCHNL RA SENSING INTR AMPL: 4 mV
MDC IDC MSMT LEADCHNL RV IMPEDANCE VALUE: 597 Ohm
MDC IDC SET LEADCHNL RA PACING AMPLITUDE: 2 V
MDC IDC SET LEADCHNL RV PACING AMPLITUDE: 2.5 V
MDC IDC SET LEADCHNL RV PACING PULSEWIDTH: 0.4 ms

## 2013-11-12 MED ORDER — FUROSEMIDE 20 MG PO TABS: ORAL_TABLET | ORAL | Status: DC

## 2013-11-12 NOTE — Progress Notes (Signed)
      Patient Care Team: Lelon Huh, MD as PCP - General (Family Medicine) Lelon Huh, MD as Referring Physician (Family Medicine)   HPI  Whitney Hoffman is a 80 y.o. female Seen in followup for tachybradycardia syndrome with prior pacemaker insertion.  She has significant complaints of exercise intolerance. She's had problems with edema in the past with her antiparkinson medication; this is better since these have been discontinued  She has a history of HFpEF and COPD  She has had some edema in the evenings with ongoing DOE   Past Medical History  Diagnosis Date  . HTN (hypertension)   . HLD (hyperlipidemia)   . Parkinson disease   . Atrial fibrillation   . Tachy-brady syndrome     with medtronic dual chambe rPCM    Past Surgical History  Procedure Laterality Date  . Appendectomy    . Hysterectomy (other)    . Gallbladder surgery    . Breast biopsy    . Cervical disc surgery    . Hip surgery      Current Outpatient Prescriptions  Medication Sig Dispense Refill  . acetaminophen (TYLENOL 8 HOUR) 650 MG CR tablet Take 650 mg by mouth as needed.        . Cholecalciferol (HM VITAMIN D3) 4000 UNITS CAPS Take 2,000 Units by mouth daily.       . Coenzyme Q10 (CO Q-10) 200 MG CAPS Take 200 mg by mouth 2 (two) times daily.      . dabigatran (PRADAXA) 75 MG CAPS capsule Take twice daily  60 capsule  6  . losartan (COZAAR) 100 MG tablet Take 100 mg by mouth every morning.        . lovastatin (MEVACOR) 20 MG tablet Take 20 mg by mouth at bedtime.        . meclizine (ANTIVERT) 25 MG tablet Take 25 mg by mouth 3 (three) times daily as needed.        . metoprolol succinate (TOPROL-XL) 25 MG 24 hr tablet TAKE 1 TABLET (25 MG TOTAL) BY MOUTH DAILY.  30 tablet  3  . raloxifene (EVISTA) 60 MG tablet Take 60 mg by mouth daily.       No current facility-administered medications for this visit.    Allergies  Allergen Reactions  . Aspirin     Sick on stomach  . Codeine    Feel dizzy  . Diazepam     Sick on stomach  . Meperidine Hcl     Sick on stomach   . Morphine     Nausea/sick on stomach    Review of Systems negative except from HPI and PMH  Physical Exam BP 132/70  Pulse 73  Ht 5\' 2"  (1.575 m)  Wt 155 lb (70.308 kg)  BMI 28.34 kg/m2 Well developed and nourished in no acute distress HENT normal Neck supple with JVP-7-8 Clear Regular rate and rhythm, no murmurs or gallops Abd-soft with active BS No Clubbing cyanosis edema Skin-warm and dry A & Oriented  Grossly normal sensory and motor function   ECG APacing intrinsic conduction 24/08/37   Assessment and  Plan  HFpEF  Sinus node Dysfunction with chronotropic incompetence  Pacemaker   Heart rate excursion reasonable ; with edema and JVP will begin low dose diuretic   She is reluctant to take it daily as it is so disruptive to life  Dont know renal function,  She is on reduced dabigitran and will check in two weeks

## 2013-11-12 NOTE — Patient Instructions (Addendum)
Remote monitoring is used to monitor your Pacemaker of ICD from home. This monitoring reduces the number of office visits required to check your device to one time per year. It allows [place] to keep an eye on the functioning of your device to ensure it is working properly. You are scheduled for a device check from home on 02/13/14. You may send your transmission at any time that day. If you have a wireless device, the transmission will be sent automatically. After your physician reviews your transmission, you will receive a postcard with your next transmission date.  Your physician recommends that you return for lab work in:  BMP 2 weeks  * Please ask for Elmyra Ricks and let her know how your breathing is when you come back for labs   Your physician has recommended you make the following change in your medication:  Start Furosamide 20 mg three times a week as needed   Your physician wants you to follow-up in: 6 months You will receive a reminder letter in the mail two months in advance. If you don't receive a letter, please call our office to schedule the follow-up appointment.

## 2013-11-14 ENCOUNTER — Other Ambulatory Visit: Payer: Self-pay

## 2013-11-14 DIAGNOSIS — G20 Parkinson's disease: Secondary | ICD-10-CM | POA: Diagnosis not present

## 2013-11-14 MED ORDER — METOPROLOL SUCCINATE ER 25 MG PO TB24: ORAL_TABLET | ORAL | Status: DC

## 2013-11-26 ENCOUNTER — Ambulatory Visit (INDEPENDENT_AMBULATORY_CARE_PROVIDER_SITE_OTHER): Payer: Medicare Other

## 2013-11-26 DIAGNOSIS — R0789 Other chest pain: Secondary | ICD-10-CM | POA: Diagnosis not present

## 2013-11-26 DIAGNOSIS — I4891 Unspecified atrial fibrillation: Secondary | ICD-10-CM

## 2013-11-26 DIAGNOSIS — R0602 Shortness of breath: Secondary | ICD-10-CM | POA: Diagnosis not present

## 2013-11-27 DIAGNOSIS — M5137 Other intervertebral disc degeneration, lumbosacral region: Secondary | ICD-10-CM | POA: Diagnosis not present

## 2013-11-27 DIAGNOSIS — M999 Biomechanical lesion, unspecified: Secondary | ICD-10-CM | POA: Diagnosis not present

## 2013-11-27 DIAGNOSIS — IMO0002 Reserved for concepts with insufficient information to code with codable children: Secondary | ICD-10-CM | POA: Diagnosis not present

## 2013-11-27 LAB — BASIC METABOLIC PANEL
BUN / CREAT RATIO: 34 — AB (ref 11–26)
BUN: 30 mg/dL — ABNORMAL HIGH (ref 8–27)
CO2: 24 mmol/L (ref 18–29)
CREATININE: 0.88 mg/dL (ref 0.57–1.00)
Calcium: 9.2 mg/dL (ref 8.7–10.3)
Chloride: 99 mmol/L (ref 97–108)
GFR calc Af Amer: 68 mL/min/{1.73_m2} (ref >59)
GFR, EST NON AFRICAN AMERICAN: 59 mL/min/{1.73_m2} — AB (ref >59)
Glucose: 79 mg/dL (ref 65–99)
Potassium: 4.9 mmol/L (ref 3.5–5.2)
Sodium: 138 mmol/L (ref 134–144)

## 2013-12-13 ENCOUNTER — Other Ambulatory Visit: Payer: Self-pay

## 2013-12-13 MED ORDER — DABIGATRAN ETEXILATE MESYLATE 75 MG PO CAPS: ORAL_CAPSULE | ORAL | Status: DC

## 2013-12-26 ENCOUNTER — Telehealth: Payer: Self-pay | Admitting: *Deleted

## 2013-12-26 NOTE — Telephone Encounter (Signed)
Patient called wanting her lab results

## 2013-12-26 NOTE — Telephone Encounter (Signed)
See result notes. 

## 2014-01-01 DIAGNOSIS — IMO0002 Reserved for concepts with insufficient information to code with codable children: Secondary | ICD-10-CM | POA: Diagnosis not present

## 2014-01-01 DIAGNOSIS — M5137 Other intervertebral disc degeneration, lumbosacral region: Secondary | ICD-10-CM | POA: Diagnosis not present

## 2014-01-01 DIAGNOSIS — M999 Biomechanical lesion, unspecified: Secondary | ICD-10-CM | POA: Diagnosis not present

## 2014-01-22 DIAGNOSIS — IMO0002 Reserved for concepts with insufficient information to code with codable children: Secondary | ICD-10-CM | POA: Diagnosis not present

## 2014-01-22 DIAGNOSIS — M5137 Other intervertebral disc degeneration, lumbosacral region: Secondary | ICD-10-CM | POA: Diagnosis not present

## 2014-01-22 DIAGNOSIS — M999 Biomechanical lesion, unspecified: Secondary | ICD-10-CM | POA: Diagnosis not present

## 2014-02-13 ENCOUNTER — Telehealth: Payer: Self-pay | Admitting: Cardiology

## 2014-02-13 ENCOUNTER — Encounter: Payer: Medicare Other | Admitting: *Deleted

## 2014-02-13 NOTE — Telephone Encounter (Signed)
LMOVM reminding pt to send remote transmission.   

## 2014-02-14 ENCOUNTER — Encounter: Payer: Self-pay | Admitting: Cardiology

## 2014-02-17 ENCOUNTER — Other Ambulatory Visit: Payer: Self-pay

## 2014-02-17 ENCOUNTER — Ambulatory Visit (INDEPENDENT_AMBULATORY_CARE_PROVIDER_SITE_OTHER): Payer: Medicare Other | Admitting: *Deleted

## 2014-02-17 DIAGNOSIS — Z950 Presence of cardiac pacemaker: Secondary | ICD-10-CM | POA: Diagnosis not present

## 2014-02-17 DIAGNOSIS — M999 Biomechanical lesion, unspecified: Secondary | ICD-10-CM | POA: Diagnosis not present

## 2014-02-17 DIAGNOSIS — M5137 Other intervertebral disc degeneration, lumbosacral region: Secondary | ICD-10-CM | POA: Diagnosis not present

## 2014-02-17 DIAGNOSIS — I4891 Unspecified atrial fibrillation: Secondary | ICD-10-CM | POA: Diagnosis not present

## 2014-02-17 DIAGNOSIS — IMO0002 Reserved for concepts with insufficient information to code with codable children: Secondary | ICD-10-CM | POA: Diagnosis not present

## 2014-02-17 LAB — MDC_IDC_ENUM_SESS_TYPE_REMOTE
Battery Remaining Longevity: 107 mo
Battery Voltage: 2.79 V
Brady Statistic AP VP Percent: 2 %
Brady Statistic AP VS Percent: 66 %
Brady Statistic AS VP Percent: 0 %
Brady Statistic AS VS Percent: 32 %
Lead Channel Impedance Value: 521 Ohm
Lead Channel Impedance Value: 582 Ohm
Lead Channel Pacing Threshold Amplitude: 0.625 V
Lead Channel Pacing Threshold Pulse Width: 0.4 ms
Lead Channel Sensing Intrinsic Amplitude: 11.2 mV
Lead Channel Sensing Intrinsic Amplitude: 2.8 mV
Lead Channel Setting Pacing Amplitude: 2 V
Lead Channel Setting Pacing Pulse Width: 0.4 ms
MDC IDC MSMT BATTERY IMPEDANCE: 247 Ohm
MDC IDC MSMT LEADCHNL RA PACING THRESHOLD AMPLITUDE: 0.5 V
MDC IDC MSMT LEADCHNL RV PACING THRESHOLD PULSEWIDTH: 0.4 ms
MDC IDC SESS DTM: 20150629124437
MDC IDC SET LEADCHNL RV PACING AMPLITUDE: 2.5 V
MDC IDC SET LEADCHNL RV SENSING SENSITIVITY: 5.6 mV

## 2014-02-17 MED ORDER — METOPROLOL SUCCINATE ER 25 MG PO TB24: ORAL_TABLET | ORAL | Status: DC

## 2014-02-17 NOTE — Progress Notes (Signed)
Remote pacemaker transmission.   

## 2014-02-18 NOTE — Telephone Encounter (Signed)
This encounter was created in error - please disregard.

## 2014-02-19 NOTE — Telephone Encounter (Signed)
This encounter was created in error - please disregard.

## 2014-03-05 ENCOUNTER — Encounter: Payer: Self-pay | Admitting: Cardiology

## 2014-03-11 ENCOUNTER — Encounter: Payer: Self-pay | Admitting: Internal Medicine

## 2014-03-14 DIAGNOSIS — M810 Age-related osteoporosis without current pathological fracture: Secondary | ICD-10-CM | POA: Diagnosis not present

## 2014-03-14 DIAGNOSIS — D239 Other benign neoplasm of skin, unspecified: Secondary | ICD-10-CM | POA: Diagnosis not present

## 2014-03-14 DIAGNOSIS — E785 Hyperlipidemia, unspecified: Secondary | ICD-10-CM | POA: Diagnosis not present

## 2014-03-14 DIAGNOSIS — I4891 Unspecified atrial fibrillation: Secondary | ICD-10-CM | POA: Diagnosis not present

## 2014-03-17 DIAGNOSIS — IMO0002 Reserved for concepts with insufficient information to code with codable children: Secondary | ICD-10-CM | POA: Diagnosis not present

## 2014-03-17 DIAGNOSIS — M5137 Other intervertebral disc degeneration, lumbosacral region: Secondary | ICD-10-CM | POA: Diagnosis not present

## 2014-03-17 DIAGNOSIS — M999 Biomechanical lesion, unspecified: Secondary | ICD-10-CM | POA: Diagnosis not present

## 2014-04-08 DIAGNOSIS — H3554 Dystrophies primarily involving the retinal pigment epithelium: Secondary | ICD-10-CM | POA: Diagnosis not present

## 2014-04-22 DIAGNOSIS — M5137 Other intervertebral disc degeneration, lumbosacral region: Secondary | ICD-10-CM | POA: Diagnosis not present

## 2014-04-22 DIAGNOSIS — M999 Biomechanical lesion, unspecified: Secondary | ICD-10-CM | POA: Diagnosis not present

## 2014-04-22 DIAGNOSIS — IMO0002 Reserved for concepts with insufficient information to code with codable children: Secondary | ICD-10-CM | POA: Diagnosis not present

## 2014-05-13 ENCOUNTER — Ambulatory Visit (INDEPENDENT_AMBULATORY_CARE_PROVIDER_SITE_OTHER): Payer: Medicare Other | Admitting: Internal Medicine

## 2014-05-13 ENCOUNTER — Encounter: Payer: Self-pay | Admitting: Internal Medicine

## 2014-05-13 VITALS — BP 132/78 | HR 68 | Ht 62.0 in | Wt 160.0 lb

## 2014-05-13 DIAGNOSIS — I4891 Unspecified atrial fibrillation: Secondary | ICD-10-CM

## 2014-05-13 DIAGNOSIS — Z0000 Encounter for general adult medical examination without abnormal findings: Secondary | ICD-10-CM | POA: Diagnosis not present

## 2014-05-13 DIAGNOSIS — E7800 Pure hypercholesterolemia, unspecified: Secondary | ICD-10-CM | POA: Diagnosis not present

## 2014-05-13 DIAGNOSIS — I495 Sick sinus syndrome: Secondary | ICD-10-CM

## 2014-05-13 DIAGNOSIS — R42 Dizziness and giddiness: Secondary | ICD-10-CM | POA: Diagnosis not present

## 2014-05-13 DIAGNOSIS — M25569 Pain in unspecified knee: Secondary | ICD-10-CM | POA: Diagnosis not present

## 2014-05-13 DIAGNOSIS — Z23 Encounter for immunization: Secondary | ICD-10-CM | POA: Diagnosis not present

## 2014-05-13 DIAGNOSIS — I10 Essential (primary) hypertension: Secondary | ICD-10-CM | POA: Diagnosis not present

## 2014-05-13 LAB — MDC_IDC_ENUM_SESS_TYPE_INCLINIC
Battery Impedance: 247 Ohm
Battery Remaining Longevity: 108 mo
Brady Statistic AP VP Percent: 3 %
Brady Statistic AS VP Percent: 0 %
Lead Channel Impedance Value: 521 Ohm
Lead Channel Pacing Threshold Amplitude: 0.5 V
Lead Channel Sensing Intrinsic Amplitude: 15.67 mV
Lead Channel Sensing Intrinsic Amplitude: 2.8 mV
Lead Channel Setting Pacing Amplitude: 2 V
Lead Channel Setting Pacing Pulse Width: 0.4 ms
Lead Channel Setting Sensing Sensitivity: 5.6 mV
MDC IDC MSMT BATTERY VOLTAGE: 2.79 V
MDC IDC MSMT LEADCHNL RA PACING THRESHOLD PULSEWIDTH: 0.4 ms
MDC IDC MSMT LEADCHNL RV IMPEDANCE VALUE: 597 Ohm
MDC IDC MSMT LEADCHNL RV PACING THRESHOLD AMPLITUDE: 0.75 V
MDC IDC MSMT LEADCHNL RV PACING THRESHOLD PULSEWIDTH: 0.4 ms
MDC IDC SESS DTM: 20150922104946
MDC IDC SET LEADCHNL RV PACING AMPLITUDE: 2.5 V
MDC IDC STAT BRADY AP VS PERCENT: 58 %
MDC IDC STAT BRADY AS VS PERCENT: 38 %

## 2014-05-13 MED ORDER — METOPROLOL SUCCINATE ER 50 MG PO TB24: 50.0000 mg | ORAL_TABLET | Freq: Every day | ORAL | Status: DC

## 2014-05-13 MED ORDER — LOSARTAN POTASSIUM 50 MG PO TABS: 50.0000 mg | ORAL_TABLET | Freq: Every day | ORAL | Status: DC

## 2014-05-13 NOTE — Patient Instructions (Addendum)
Your physician wants you to follow-up in:   4 months  Remote monitoring is used to monitor your Pacemaker of ICD from home. This monitoring reduces the number of office visits required to check your device to one time per year. It allows [place] to keep an eye on the functioning of your device to ensure it is working properly. You are scheduled for a device check from home on 08/14/14. You may send your transmission at any time that day. If you have a wireless device, the transmission will be sent automatically. After your physician reviews your transmission, you will receive a postcard with your next transmission date.  Your physician has recommended you make the following change in your medication:  Increase Metoprolol to 50 mg once daily  Decrease Losartan to 50 mg once daily   Your physician recommends that you have labs today: BMP

## 2014-05-13 NOTE — Progress Notes (Signed)
Patient Care Team: Lelon Huh, MD as PCP - General (Family Medicine) Lelon Huh, MD as Referring Physician (Family Medicine)   HPI  Whitney Hoffman is a 80 y.o. female Seen in followup for tachybradycardia syndrome with prior pacemaker insertion.  She has significant complaints of exercise intolerance. She's had problems with edema in the past with her antiparkinson medication; this is better since these have been discontinued  She has a history of HFpEF and COPD  She has complaints of exercise intolerance. It is her impression that her heart is racing. The edema is better. She also complains of dizziness for which she is taking meclizine. This dizziness is most prominent in the morning upon arising.      Past Medical History  Diagnosis Date  . HTN (hypertension)   . HLD (hyperlipidemia)   . Parkinson disease   . Atrial fibrillation   . Tachy-brady syndrome     with medtronic dual chambe rPCM    Past Surgical History  Procedure Laterality Date  . Appendectomy    . Hysterectomy (other)    . Gallbladder surgery    . Breast biopsy    . Cervical disc surgery    . Hip surgery      Current Outpatient Prescriptions  Medication Sig Dispense Refill  . acetaminophen (TYLENOL 8 HOUR) 650 MG CR tablet Take 650 mg by mouth as needed.        . Cholecalciferol (HM VITAMIN D3) 4000 UNITS CAPS Take 2,000 Units by mouth daily.       . Cyanocobalamin (B-12 PO) Take by mouth daily.      . dabigatran (PRADAXA) 75 MG CAPS capsule Take twice daily  60 capsule  6  . furosemide (LASIX) 20 MG tablet Take one 20 mg tablet three times a week as needed  30 tablet  6  . losartan (COZAAR) 100 MG tablet Take 100 mg by mouth every morning.        . lovastatin (MEVACOR) 20 MG tablet Take 20 mg by mouth at bedtime.        . meclizine (ANTIVERT) 25 MG tablet Take 25 mg by mouth 3 (three) times daily as needed.        . metoprolol succinate (TOPROL-XL) 25 MG 24 hr tablet TAKE 1 TABLET  (25 MG TOTAL) BY MOUTH DAILY.  30 tablet  3  . raloxifene (EVISTA) 60 MG tablet Take 60 mg by mouth daily.       No current facility-administered medications for this visit.    Allergies  Allergen Reactions  . Aspirin     Sick on stomach  . Codeine     Feel dizzy  . Diazepam     Sick on stomach  . Meperidine Hcl     Sick on stomach   . Morphine     Nausea/sick on stomach    Review of Systems negative except from HPI and PMH  Physical Exam BP 132/78  Pulse 68  Ht 5\' 2"  (1.575 m)  Wt 160 lb (72.576 kg)  BMI 29.26 kg/m2 Well developed and nourished in no acute distress HENT normal Neck supple with JVP-7-8 Clear Regular rate and rhythm, no murmurs or gallops Abd-soft with active BS No Clubbing cyanosis edema Skin-warm and dry A & Oriented  Grossly normal sensory and motor function   ECG APacing intrinsic conduction 24/08/37   Assessment and  Plan  HFpEF  Sinus node Dysfunction    Pacemaker Medtronic The patient's device  was interrogated.  The information was reviewed. No changes were made in the programming.    Orthostatic intolerance-a.m.  Junctional rhythm   Interestingly, about 6% of her beats faster than 100 beats per minute. This represents about 2 hours a day. I'm not sure why her heart rates are so rapid although orthostasis may be contributing. We will decrease her losartan 100--50 and increase her metoprolol gently 25--50.  We have also discussed the physiology of orthostatic intolerance and encouraged her to 1) in bed risers 4-6 inches, 2) fluids prior to arising, #3) set on the side of the bed, 4) use her back brace prior to arising at the conjunction like an abdominal binder.  She is euvolemic. We'll continue her on her low dose diuretic and check a metabolic profile.  She is also noted to have junctional rhythm manifested by an AV delay of only 40 ms.

## 2014-05-14 LAB — BASIC METABOLIC PANEL
BUN/Creatinine Ratio: 31 — ABNORMAL HIGH (ref 11–26)
BUN: 26 mg/dL (ref 8–27)
CHLORIDE: 100 mmol/L (ref 97–108)
CO2: 24 mmol/L (ref 18–29)
Calcium: 8.8 mg/dL (ref 8.7–10.3)
Creatinine, Ser: 0.85 mg/dL (ref 0.57–1.00)
GFR calc Af Amer: 71 mL/min/{1.73_m2} (ref >59)
GFR calc non Af Amer: 61 mL/min/{1.73_m2} (ref >59)
Glucose: 98 mg/dL (ref 65–99)
Potassium: 5.1 mmol/L (ref 3.5–5.2)
Sodium: 138 mmol/L (ref 134–144)

## 2014-05-16 ENCOUNTER — Ambulatory Visit: Payer: Self-pay | Admitting: Family Medicine

## 2014-05-16 DIAGNOSIS — M1710 Unilateral primary osteoarthritis, unspecified knee: Secondary | ICD-10-CM | POA: Diagnosis not present

## 2014-05-16 DIAGNOSIS — E7800 Pure hypercholesterolemia, unspecified: Secondary | ICD-10-CM | POA: Diagnosis not present

## 2014-05-16 DIAGNOSIS — I10 Essential (primary) hypertension: Secondary | ICD-10-CM | POA: Diagnosis not present

## 2014-05-16 DIAGNOSIS — IMO0002 Reserved for concepts with insufficient information to code with codable children: Secondary | ICD-10-CM | POA: Diagnosis not present

## 2014-05-16 LAB — BASIC METABOLIC PANEL
BUN: 22 mg/dL — AB (ref 4–21)
Creatinine: 1 mg/dL (ref 0.5–1.1)
Glucose: 101 mg/dL
Potassium: 4.8 mmol/L (ref 3.4–5.3)
SODIUM: 142 mmol/L (ref 137–147)

## 2014-05-16 LAB — LIPID PANEL
CHOLESTEROL: 154 mg/dL (ref 0–200)
HDL: 54 mg/dL (ref 35–70)
LDL CALC: 80 mg/dL
TRIGLYCERIDES: 99 mg/dL (ref 40–160)

## 2014-05-28 DIAGNOSIS — M5416 Radiculopathy, lumbar region: Secondary | ICD-10-CM | POA: Diagnosis not present

## 2014-05-28 DIAGNOSIS — M9905 Segmental and somatic dysfunction of pelvic region: Secondary | ICD-10-CM | POA: Diagnosis not present

## 2014-05-28 DIAGNOSIS — M955 Acquired deformity of pelvis: Secondary | ICD-10-CM | POA: Diagnosis not present

## 2014-05-28 DIAGNOSIS — M9903 Segmental and somatic dysfunction of lumbar region: Secondary | ICD-10-CM | POA: Diagnosis not present

## 2014-06-11 DIAGNOSIS — L821 Other seborrheic keratosis: Secondary | ICD-10-CM | POA: Diagnosis not present

## 2014-06-11 DIAGNOSIS — D485 Neoplasm of uncertain behavior of skin: Secondary | ICD-10-CM | POA: Diagnosis not present

## 2014-06-11 DIAGNOSIS — D0439 Carcinoma in situ of skin of other parts of face: Secondary | ICD-10-CM | POA: Diagnosis not present

## 2014-06-25 DIAGNOSIS — M9903 Segmental and somatic dysfunction of lumbar region: Secondary | ICD-10-CM | POA: Diagnosis not present

## 2014-06-25 DIAGNOSIS — M955 Acquired deformity of pelvis: Secondary | ICD-10-CM | POA: Diagnosis not present

## 2014-06-25 DIAGNOSIS — M5416 Radiculopathy, lumbar region: Secondary | ICD-10-CM | POA: Diagnosis not present

## 2014-06-25 DIAGNOSIS — G20 Parkinson's disease: Secondary | ICD-10-CM | POA: Diagnosis not present

## 2014-06-25 DIAGNOSIS — M9905 Segmental and somatic dysfunction of pelvic region: Secondary | ICD-10-CM | POA: Diagnosis not present

## 2014-06-25 DIAGNOSIS — M79642 Pain in left hand: Secondary | ICD-10-CM | POA: Diagnosis not present

## 2014-07-11 ENCOUNTER — Other Ambulatory Visit: Payer: Self-pay | Admitting: Internal Medicine

## 2014-07-23 DIAGNOSIS — M5416 Radiculopathy, lumbar region: Secondary | ICD-10-CM | POA: Diagnosis not present

## 2014-07-23 DIAGNOSIS — M9905 Segmental and somatic dysfunction of pelvic region: Secondary | ICD-10-CM | POA: Diagnosis not present

## 2014-07-23 DIAGNOSIS — M955 Acquired deformity of pelvis: Secondary | ICD-10-CM | POA: Diagnosis not present

## 2014-07-23 DIAGNOSIS — M9903 Segmental and somatic dysfunction of lumbar region: Secondary | ICD-10-CM | POA: Diagnosis not present

## 2014-08-12 DIAGNOSIS — R6884 Jaw pain: Secondary | ICD-10-CM | POA: Diagnosis not present

## 2014-08-13 ENCOUNTER — Ambulatory Visit: Payer: Self-pay | Admitting: Family Medicine

## 2014-08-13 DIAGNOSIS — R6884 Jaw pain: Secondary | ICD-10-CM | POA: Diagnosis not present

## 2014-08-14 ENCOUNTER — Ambulatory Visit: Payer: Medicare Other | Admitting: *Deleted

## 2014-08-19 DIAGNOSIS — D0439 Carcinoma in situ of skin of other parts of face: Secondary | ICD-10-CM | POA: Diagnosis not present

## 2014-08-20 ENCOUNTER — Ambulatory Visit (INDEPENDENT_AMBULATORY_CARE_PROVIDER_SITE_OTHER): Payer: Medicare Other | Admitting: *Deleted

## 2014-08-20 ENCOUNTER — Encounter: Payer: Self-pay | Admitting: Cardiology

## 2014-08-20 DIAGNOSIS — I495 Sick sinus syndrome: Secondary | ICD-10-CM | POA: Diagnosis not present

## 2014-08-20 LAB — MDC_IDC_ENUM_SESS_TYPE_REMOTE
Battery Remaining Longevity: 101 mo
Battery Voltage: 2.79 V
Brady Statistic AP VP Percent: 4 %
Date Time Interrogation Session: 20151230211532
Lead Channel Impedance Value: 514 Ohm
Lead Channel Pacing Threshold Amplitude: 0.625 V
Lead Channel Pacing Threshold Pulse Width: 0.4 ms
Lead Channel Sensing Intrinsic Amplitude: 11.2 mV
Lead Channel Sensing Intrinsic Amplitude: 2.8 mV
Lead Channel Setting Pacing Amplitude: 2 V
Lead Channel Setting Pacing Amplitude: 2.5 V
Lead Channel Setting Pacing Pulse Width: 0.4 ms
MDC IDC MSMT BATTERY IMPEDANCE: 321 Ohm
MDC IDC MSMT LEADCHNL RA PACING THRESHOLD AMPLITUDE: 0.625 V
MDC IDC MSMT LEADCHNL RV IMPEDANCE VALUE: 607 Ohm
MDC IDC MSMT LEADCHNL RV PACING THRESHOLD PULSEWIDTH: 0.4 ms
MDC IDC SET LEADCHNL RV SENSING SENSITIVITY: 5.6 mV
MDC IDC STAT BRADY AP VS PERCENT: 44 %
MDC IDC STAT BRADY AS VP PERCENT: 1 %
MDC IDC STAT BRADY AS VS PERCENT: 50 %

## 2014-08-21 NOTE — Progress Notes (Signed)
Remote pacemaker transmission.   

## 2014-08-25 DIAGNOSIS — M9903 Segmental and somatic dysfunction of lumbar region: Secondary | ICD-10-CM | POA: Diagnosis not present

## 2014-08-25 DIAGNOSIS — M5416 Radiculopathy, lumbar region: Secondary | ICD-10-CM | POA: Diagnosis not present

## 2014-08-25 DIAGNOSIS — M9905 Segmental and somatic dysfunction of pelvic region: Secondary | ICD-10-CM | POA: Diagnosis not present

## 2014-08-25 DIAGNOSIS — M955 Acquired deformity of pelvis: Secondary | ICD-10-CM | POA: Diagnosis not present

## 2014-09-05 ENCOUNTER — Encounter: Payer: Self-pay | Admitting: Cardiology

## 2014-09-10 ENCOUNTER — Encounter: Payer: Self-pay | Admitting: Internal Medicine

## 2014-09-21 ENCOUNTER — Emergency Department: Payer: Self-pay | Admitting: Emergency Medicine

## 2014-09-21 DIAGNOSIS — S42292A Other displaced fracture of upper end of left humerus, initial encounter for closed fracture: Secondary | ICD-10-CM | POA: Diagnosis not present

## 2014-09-21 DIAGNOSIS — M79602 Pain in left arm: Secondary | ICD-10-CM | POA: Diagnosis not present

## 2014-09-21 DIAGNOSIS — R0781 Pleurodynia: Secondary | ICD-10-CM | POA: Diagnosis not present

## 2014-09-21 DIAGNOSIS — R0789 Other chest pain: Secondary | ICD-10-CM | POA: Diagnosis not present

## 2014-09-21 DIAGNOSIS — S42255A Nondisplaced fracture of greater tuberosity of left humerus, initial encounter for closed fracture: Secondary | ICD-10-CM | POA: Diagnosis not present

## 2014-09-21 DIAGNOSIS — S59912A Unspecified injury of left forearm, initial encounter: Secondary | ICD-10-CM | POA: Diagnosis not present

## 2014-09-21 DIAGNOSIS — I10 Essential (primary) hypertension: Secondary | ICD-10-CM | POA: Diagnosis not present

## 2014-09-21 DIAGNOSIS — S299XXA Unspecified injury of thorax, initial encounter: Secondary | ICD-10-CM | POA: Diagnosis not present

## 2014-09-21 LAB — COMPREHENSIVE METABOLIC PANEL
ALBUMIN: 3.1 g/dL — AB (ref 3.4–5.0)
ALK PHOS: 46 U/L (ref 46–116)
ALT: 21 U/L (ref 14–63)
ANION GAP: 7 (ref 7–16)
AST: 32 U/L (ref 15–37)
BUN: 25 mg/dL — AB (ref 7–18)
Bilirubin,Total: 0.4 mg/dL (ref 0.2–1.0)
CALCIUM: 8.5 mg/dL (ref 8.5–10.1)
Chloride: 104 mmol/L (ref 98–107)
Co2: 27 mmol/L (ref 21–32)
Creatinine: 0.98 mg/dL (ref 0.60–1.30)
EGFR (African American): >60
GFR CALC NON AF AMER: 57 — AB
Glucose: 128 mg/dL — ABNORMAL HIGH (ref 65–99)
Osmolality: 282 (ref 275–301)
POTASSIUM: 3.9 mmol/L (ref 3.5–5.1)
Sodium: 138 mmol/L (ref 136–145)
TOTAL PROTEIN: 6.3 g/dL — AB (ref 6.4–8.2)

## 2014-09-21 LAB — CBC
HCT: 38.7 % (ref 35.0–47.0)
HGB: 12.4 g/dL (ref 12.0–16.0)
MCH: 29 pg (ref 26.0–34.0)
MCHC: 31.9 g/dL — AB (ref 32.0–36.0)
MCV: 91 fL (ref 80–100)
Platelet: 227 10*3/uL (ref 150–440)
RBC: 4.26 10*6/uL (ref 3.80–5.20)
RDW: 13.6 % (ref 11.5–14.5)
WBC: 13.6 10*3/uL — ABNORMAL HIGH (ref 3.6–11.0)

## 2014-09-21 LAB — TROPONIN I: Troponin-I: <0.02 ng/mL

## 2014-09-21 LAB — PROTIME-INR
INR: 1.1
Prothrombin Time: 14.4 secs

## 2014-09-21 LAB — APTT: ACTIVATED PTT: 33.8 s (ref 23.6–35.9)

## 2014-09-23 DIAGNOSIS — S42225D 2-part nondisplaced fracture of surgical neck of left humerus, subsequent encounter for fracture with routine healing: Secondary | ICD-10-CM | POA: Diagnosis not present

## 2014-10-09 DIAGNOSIS — S42225D 2-part nondisplaced fracture of surgical neck of left humerus, subsequent encounter for fracture with routine healing: Secondary | ICD-10-CM | POA: Diagnosis not present

## 2014-10-21 DIAGNOSIS — N390 Urinary tract infection, site not specified: Secondary | ICD-10-CM | POA: Diagnosis not present

## 2014-10-27 ENCOUNTER — Other Ambulatory Visit: Payer: Self-pay | Admitting: Internal Medicine

## 2014-10-30 DIAGNOSIS — S42225D 2-part nondisplaced fracture of surgical neck of left humerus, subsequent encounter for fracture with routine healing: Secondary | ICD-10-CM | POA: Diagnosis not present

## 2014-11-06 DIAGNOSIS — J019 Acute sinusitis, unspecified: Secondary | ICD-10-CM | POA: Diagnosis not present

## 2014-11-06 DIAGNOSIS — J309 Allergic rhinitis, unspecified: Secondary | ICD-10-CM | POA: Diagnosis not present

## 2014-11-11 DIAGNOSIS — M25512 Pain in left shoulder: Secondary | ICD-10-CM | POA: Diagnosis not present

## 2014-11-11 DIAGNOSIS — M25612 Stiffness of left shoulder, not elsewhere classified: Secondary | ICD-10-CM | POA: Diagnosis not present

## 2014-11-14 DIAGNOSIS — M25512 Pain in left shoulder: Secondary | ICD-10-CM | POA: Diagnosis not present

## 2014-11-14 DIAGNOSIS — M25612 Stiffness of left shoulder, not elsewhere classified: Secondary | ICD-10-CM | POA: Diagnosis not present

## 2014-11-19 ENCOUNTER — Encounter: Payer: Medicare Other | Admitting: *Deleted

## 2014-11-19 ENCOUNTER — Telehealth: Payer: Self-pay | Admitting: Cardiology

## 2014-11-19 DIAGNOSIS — M25612 Stiffness of left shoulder, not elsewhere classified: Secondary | ICD-10-CM | POA: Diagnosis not present

## 2014-11-19 DIAGNOSIS — M25512 Pain in left shoulder: Secondary | ICD-10-CM | POA: Diagnosis not present

## 2014-11-19 NOTE — Telephone Encounter (Signed)
LMOVM reminding pt to send remote transmission.   

## 2014-11-20 ENCOUNTER — Encounter: Payer: Self-pay | Admitting: Cardiology

## 2014-11-21 DIAGNOSIS — M25512 Pain in left shoulder: Secondary | ICD-10-CM | POA: Diagnosis not present

## 2014-11-21 DIAGNOSIS — M25612 Stiffness of left shoulder, not elsewhere classified: Secondary | ICD-10-CM | POA: Diagnosis not present

## 2014-11-23 ENCOUNTER — Encounter: Payer: Self-pay | Admitting: Internal Medicine

## 2014-11-23 DIAGNOSIS — I495 Sick sinus syndrome: Secondary | ICD-10-CM

## 2014-11-24 ENCOUNTER — Ambulatory Visit (INDEPENDENT_AMBULATORY_CARE_PROVIDER_SITE_OTHER): Payer: Medicare Other | Admitting: *Deleted

## 2014-11-24 DIAGNOSIS — I495 Sick sinus syndrome: Secondary | ICD-10-CM

## 2014-11-25 DIAGNOSIS — M25512 Pain in left shoulder: Secondary | ICD-10-CM | POA: Diagnosis not present

## 2014-11-25 DIAGNOSIS — M25612 Stiffness of left shoulder, not elsewhere classified: Secondary | ICD-10-CM | POA: Diagnosis not present

## 2014-11-25 NOTE — Progress Notes (Signed)
Remote pacemaker transmission.   

## 2014-11-26 LAB — MDC_IDC_ENUM_SESS_TYPE_REMOTE
Battery Impedance: 345 Ohm
Battery Voltage: 2.79 V
Brady Statistic AS VS Percent: 49 %
Date Time Interrogation Session: 20160403172749
Lead Channel Impedance Value: 505 Ohm
Lead Channel Impedance Value: 591 Ohm
Lead Channel Pacing Threshold Amplitude: 0.75 V
Lead Channel Pacing Threshold Pulse Width: 0.4 ms
Lead Channel Setting Pacing Amplitude: 2 V
Lead Channel Setting Pacing Amplitude: 2.5 V
MDC IDC MSMT BATTERY REMAINING LONGEVITY: 99 mo
MDC IDC MSMT LEADCHNL RA PACING THRESHOLD AMPLITUDE: 0.625 V
MDC IDC MSMT LEADCHNL RA PACING THRESHOLD PULSEWIDTH: 0.4 ms
MDC IDC MSMT LEADCHNL RA SENSING INTR AMPL: 1 mV
MDC IDC MSMT LEADCHNL RV SENSING INTR AMPL: 16 mV
MDC IDC SET LEADCHNL RV PACING PULSEWIDTH: 0.4 ms
MDC IDC SET LEADCHNL RV SENSING SENSITIVITY: 5.6 mV
MDC IDC STAT BRADY AP VP PERCENT: 4 %
MDC IDC STAT BRADY AP VS PERCENT: 46 %
MDC IDC STAT BRADY AS VP PERCENT: 1 %

## 2014-11-27 ENCOUNTER — Other Ambulatory Visit: Payer: Self-pay

## 2014-11-27 DIAGNOSIS — S42225D 2-part nondisplaced fracture of surgical neck of left humerus, subsequent encounter for fracture with routine healing: Secondary | ICD-10-CM | POA: Diagnosis not present

## 2014-11-27 MED ORDER — DABIGATRAN ETEXILATE MESYLATE 75 MG PO CAPS: 75.0000 mg | ORAL_CAPSULE | Freq: Two times a day (BID) | ORAL | Status: DC

## 2014-11-27 NOTE — Telephone Encounter (Signed)
Refill sent for Pradaxa 75 mg one tablet twice a day.

## 2014-11-28 DIAGNOSIS — M25512 Pain in left shoulder: Secondary | ICD-10-CM | POA: Diagnosis not present

## 2014-11-28 DIAGNOSIS — M25612 Stiffness of left shoulder, not elsewhere classified: Secondary | ICD-10-CM | POA: Diagnosis not present

## 2014-12-03 DIAGNOSIS — M25512 Pain in left shoulder: Secondary | ICD-10-CM | POA: Diagnosis not present

## 2014-12-03 DIAGNOSIS — M25612 Stiffness of left shoulder, not elsewhere classified: Secondary | ICD-10-CM | POA: Diagnosis not present

## 2014-12-09 DIAGNOSIS — M25512 Pain in left shoulder: Secondary | ICD-10-CM | POA: Diagnosis not present

## 2014-12-09 DIAGNOSIS — M25612 Stiffness of left shoulder, not elsewhere classified: Secondary | ICD-10-CM | POA: Diagnosis not present

## 2014-12-16 DIAGNOSIS — M25512 Pain in left shoulder: Secondary | ICD-10-CM | POA: Diagnosis not present

## 2014-12-16 DIAGNOSIS — M25612 Stiffness of left shoulder, not elsewhere classified: Secondary | ICD-10-CM | POA: Diagnosis not present

## 2015-01-01 DIAGNOSIS — M25612 Stiffness of left shoulder, not elsewhere classified: Secondary | ICD-10-CM | POA: Diagnosis not present

## 2015-01-01 DIAGNOSIS — M25512 Pain in left shoulder: Secondary | ICD-10-CM | POA: Diagnosis not present

## 2015-01-07 DIAGNOSIS — M25612 Stiffness of left shoulder, not elsewhere classified: Secondary | ICD-10-CM | POA: Diagnosis not present

## 2015-01-07 DIAGNOSIS — M25512 Pain in left shoulder: Secondary | ICD-10-CM | POA: Diagnosis not present

## 2015-02-18 DIAGNOSIS — D1801 Hemangioma of skin and subcutaneous tissue: Secondary | ICD-10-CM | POA: Diagnosis not present

## 2015-02-18 DIAGNOSIS — H61031 Chondritis of right external ear: Secondary | ICD-10-CM | POA: Diagnosis not present

## 2015-02-18 DIAGNOSIS — Z85828 Personal history of other malignant neoplasm of skin: Secondary | ICD-10-CM | POA: Diagnosis not present

## 2015-02-18 DIAGNOSIS — D235 Other benign neoplasm of skin of trunk: Secondary | ICD-10-CM | POA: Diagnosis not present

## 2015-02-25 ENCOUNTER — Encounter: Payer: Self-pay | Admitting: Internal Medicine

## 2015-02-25 ENCOUNTER — Other Ambulatory Visit: Payer: Self-pay | Admitting: *Deleted

## 2015-02-25 MED ORDER — METOPROLOL SUCCINATE ER 50 MG PO TB24: 50.0000 mg | ORAL_TABLET | Freq: Every day | ORAL | Status: DC

## 2015-02-27 ENCOUNTER — Telehealth: Payer: Self-pay | Admitting: *Deleted

## 2015-02-27 NOTE — Telephone Encounter (Signed)
Per Dr. Caryl Comes, pt to send an extra Carelink to update Mode Switch percentage. Pt states she takes her Pradaxa religiously. Pt will send a remote on Monday when someone can help her due to her Parkinson's.   Pt aware ROV w/ Dr. Caryl Comes in [facility] 04/21/15.

## 2015-03-04 ENCOUNTER — Encounter: Payer: Self-pay | Admitting: Internal Medicine

## 2015-03-11 ENCOUNTER — Encounter: Payer: Self-pay | Admitting: Internal Medicine

## 2015-03-17 DIAGNOSIS — M5416 Radiculopathy, lumbar region: Secondary | ICD-10-CM | POA: Diagnosis not present

## 2015-03-17 DIAGNOSIS — M9902 Segmental and somatic dysfunction of thoracic region: Secondary | ICD-10-CM | POA: Diagnosis not present

## 2015-03-17 DIAGNOSIS — M9903 Segmental and somatic dysfunction of lumbar region: Secondary | ICD-10-CM | POA: Diagnosis not present

## 2015-03-17 DIAGNOSIS — M62830 Muscle spasm of back: Secondary | ICD-10-CM | POA: Diagnosis not present

## 2015-03-18 ENCOUNTER — Other Ambulatory Visit: Payer: Self-pay | Admitting: *Deleted

## 2015-03-18 MED ORDER — LOVASTATIN 20 MG PO TABS: 20.0000 mg | ORAL_TABLET | Freq: Every day | ORAL | Status: DC

## 2015-03-18 MED ORDER — DABIGATRAN ETEXILATE MESYLATE 75 MG PO CAPS: 75.0000 mg | ORAL_CAPSULE | Freq: Two times a day (BID) | ORAL | Status: DC

## 2015-03-18 NOTE — Telephone Encounter (Signed)
Refill request for Lovastatin 20 mg Last filled by MD on- 03/08/2014 #30 x11 Last Appt: 11/06/2014 Next Appt: none Please advise refill?

## 2015-04-14 DIAGNOSIS — M5416 Radiculopathy, lumbar region: Secondary | ICD-10-CM | POA: Diagnosis not present

## 2015-04-14 DIAGNOSIS — M62830 Muscle spasm of back: Secondary | ICD-10-CM | POA: Diagnosis not present

## 2015-04-14 DIAGNOSIS — M9903 Segmental and somatic dysfunction of lumbar region: Secondary | ICD-10-CM | POA: Diagnosis not present

## 2015-04-14 DIAGNOSIS — M9902 Segmental and somatic dysfunction of thoracic region: Secondary | ICD-10-CM | POA: Diagnosis not present

## 2015-04-21 ENCOUNTER — Ambulatory Visit (INDEPENDENT_AMBULATORY_CARE_PROVIDER_SITE_OTHER): Payer: Medicare Other | Admitting: Internal Medicine

## 2015-04-21 ENCOUNTER — Encounter: Payer: Self-pay | Admitting: Internal Medicine

## 2015-04-21 VITALS — BP 128/90 | HR 101 | Ht 63.0 in | Wt 157.0 lb

## 2015-04-21 DIAGNOSIS — Z950 Presence of cardiac pacemaker: Secondary | ICD-10-CM

## 2015-04-21 DIAGNOSIS — I495 Sick sinus syndrome: Secondary | ICD-10-CM

## 2015-04-21 DIAGNOSIS — I4891 Unspecified atrial fibrillation: Secondary | ICD-10-CM

## 2015-04-21 LAB — CUP PACEART INCLINIC DEVICE CHECK
Battery Impedance: 395 Ohm
Battery Voltage: 2.79 V
Brady Statistic AS VP Percent: 1 %
Date Time Interrogation Session: 20160830124704
Lead Channel Impedance Value: 550 Ohm
Lead Channel Pacing Threshold Pulse Width: 0.4 ms
Lead Channel Sensing Intrinsic Amplitude: 0.7 mV
Lead Channel Setting Pacing Amplitude: 2 V
Lead Channel Setting Pacing Amplitude: 2.5 V
Lead Channel Setting Sensing Sensitivity: 5.6 mV
MDC IDC MSMT BATTERY REMAINING LONGEVITY: 93 mo
MDC IDC MSMT LEADCHNL RA IMPEDANCE VALUE: 514 Ohm
MDC IDC MSMT LEADCHNL RV PACING THRESHOLD AMPLITUDE: 0.75 V
MDC IDC MSMT LEADCHNL RV SENSING INTR AMPL: 11.2 mV
MDC IDC SET LEADCHNL RV PACING PULSEWIDTH: 0.4 ms
MDC IDC STAT BRADY AP VP PERCENT: 3 %
MDC IDC STAT BRADY AP VS PERCENT: 35 %
MDC IDC STAT BRADY AS VS PERCENT: 61 %

## 2015-04-21 MED ORDER — LOSARTAN POTASSIUM 25 MG PO TABS: 25.0000 mg | ORAL_TABLET | Freq: Every day | ORAL | Status: DC

## 2015-04-21 MED ORDER — METOPROLOL TARTRATE 50 MG PO TABS: 50.0000 mg | ORAL_TABLET | Freq: Two times a day (BID) | ORAL | Status: DC

## 2015-04-21 NOTE — Progress Notes (Signed)
Patient Care Team: Birdie Sons, MD as PCP - General (Family Medicine) Birdie Sons, MD as Referring Physician (Family Medicine)   HPI  Whitney Hoffman is a 81 y.o. female Seen in followup for tachybradycardia syndrome with prior pacemaker insertion.  She has significant complaints of exercise intolerance. She's had problems with edema in the past with her antiparkinson medication; this is better since these have been discontinued   she's also had a problem with orthostatic intolerance and dizziness.  This is much improved.       Past Medical History  Diagnosis Date  . HTN (hypertension)   . HLD (hyperlipidemia)   . Parkinson disease   . Atrial fibrillation   . Tachy-brady syndrome     with medtronic dual chambe rPCM    Past Surgical History  Procedure Laterality Date  . Appendectomy    . Hysterectomy (other)    . Gallbladder surgery    . Breast biopsy    . Cervical disc surgery    . Hip surgery      Current Outpatient Prescriptions  Medication Sig Dispense Refill  . acetaminophen (TYLENOL 8 HOUR) 650 MG CR tablet Take 650 mg by mouth as needed.      . Cholecalciferol (HM VITAMIN D3) 4000 UNITS CAPS Take 2,000 Units by mouth daily.     . Cyanocobalamin (B-12 PO) Take by mouth daily.    . dabigatran (PRADAXA) 75 MG CAPS capsule Take 1 capsule (75 mg total) by mouth 2 (two) times daily. 60 capsule 3  . furosemide (LASIX) 20 MG tablet Take one 20 mg tablet three times a week as needed 30 tablet 6  . losartan (COZAAR) 50 MG tablet Take 1 tablet (50 mg total) by mouth daily. 90 tablet 3  . lovastatin (MEVACOR) 20 MG tablet Take 1 tablet (20 mg total) by mouth at bedtime. 30 tablet 6  . meclizine (ANTIVERT) 25 MG tablet Take 25 mg by mouth 3 (three) times daily as needed.      . metoprolol succinate (TOPROL-XL) 50 MG 24 hr tablet Take 1 tablet (50 mg total) by mouth daily. Take with or immediately following a meal. 90 tablet 3  . raloxifene (EVISTA) 60 MG  tablet Take 60 mg by mouth daily.     No current facility-administered medications for this visit.    Allergies  Allergen Reactions  . Aspirin     Sick on stomach  . Codeine     Feel dizzy  . Diazepam     Sick on stomach  . Meperidine Hcl     Sick on stomach   . Morphine     Nausea/sick on stomach    Review of Systems negative except from HPI and PMH  Physical Exam BP 128/90 mmHg  Pulse 101  Ht 5\' 3"  (1.6 m)  Wt 157 lb (71.215 kg)  BMI 27.82 kg/m2 Well developed and nourished in no acute distress HENT normal Neck supple   Device pocket well healed; without hematoma or erythema.  There is no tethering  Clear Regular rate and rhythm, no murmurs or gallops Abd-soft with active BS No Clubbing cyanosis edema Skin-warm and dry A & Oriented  Grossly normal sensory and motor function with tremor   ECG Atrial fibrillation at a rate of 101 Intervals-/08/39   Assessment and  Plan  HFpEF  Sinus node Dysfunction    Persistent atrial fib  Pacemaker Medtronic The patient's device was interrogated.  The information was  reviewed. No changes were made in the programming.    Orthostatic intolerance-a.m.  Junctional rhythm   She has persistent atrial fibrillation with relatively rapid rate. Blood pressure is well-controlled.  we will increase her metoprolol for augmented rate control and decrease her losartan in parallel so as not to create a problem with hypotension   Orthostasis is much improved.   Blood pressure stable as noted.

## 2015-04-21 NOTE — Patient Instructions (Addendum)
Medication Instructions:  Your physician has recommended you make the following change in your medication:  STOP taking losartan 50mg  START taking losartan 25mg  once per day START taking metoprolol 50mg  twice per day    Labwork: none  Testing/Procedures: Remote monitoring is used to monitor your Pacemaker of ICD from home. This monitoring reduces the number of office visits required to check your device to one time per year. It allows [place] to keep an eye on the functioning of your device to ensure it is working properly. You are scheduled for a device check from home on November 29. You may send your transmission at any time that day. If you have a wireless device, the transmission will be sent automatically. After your physician reviews your transmission, you will receive a postcard with your next transmission date.    Follow-Up: Your physician wants you to follow-up in: one year with Dr. Gari Crown will receive a reminder letter in the mail two months in advance. If you don't receive a letter, please call our office to schedule the follow-up appointment.   Any Other Special Instructions Will Be Listed Below (If Applicable).

## 2015-05-15 ENCOUNTER — Encounter: Payer: Self-pay | Admitting: Internal Medicine

## 2015-06-02 DIAGNOSIS — M9903 Segmental and somatic dysfunction of lumbar region: Secondary | ICD-10-CM | POA: Diagnosis not present

## 2015-06-02 DIAGNOSIS — M9902 Segmental and somatic dysfunction of thoracic region: Secondary | ICD-10-CM | POA: Diagnosis not present

## 2015-06-02 DIAGNOSIS — M5416 Radiculopathy, lumbar region: Secondary | ICD-10-CM | POA: Diagnosis not present

## 2015-06-02 DIAGNOSIS — M62830 Muscle spasm of back: Secondary | ICD-10-CM | POA: Diagnosis not present

## 2015-06-12 ENCOUNTER — Emergency Department
Admission: EM | Admit: 2015-06-12 | Discharge: 2015-06-12 | Disposition: A | Discharge status: Home / Self Care | Payer: Medicare Other | Attending: Emergency Medicine | Admitting: Emergency Medicine

## 2015-06-12 ENCOUNTER — Encounter: Payer: Self-pay | Admitting: Emergency Medicine

## 2015-06-12 ENCOUNTER — Telehealth: Payer: Self-pay | Admitting: *Deleted

## 2015-06-12 DIAGNOSIS — Z79899 Other long term (current) drug therapy: Secondary | ICD-10-CM | POA: Diagnosis not present

## 2015-06-12 DIAGNOSIS — J029 Acute pharyngitis, unspecified: Secondary | ICD-10-CM | POA: Diagnosis present

## 2015-06-12 DIAGNOSIS — J040 Acute laryngitis: Secondary | ICD-10-CM | POA: Diagnosis not present

## 2015-06-12 DIAGNOSIS — Z7902 Long term (current) use of antithrombotics/antiplatelets: Secondary | ICD-10-CM | POA: Diagnosis not present

## 2015-06-12 DIAGNOSIS — R9431 Abnormal electrocardiogram [ECG] [EKG]: Secondary | ICD-10-CM | POA: Diagnosis not present

## 2015-06-12 DIAGNOSIS — I10 Essential (primary) hypertension: Secondary | ICD-10-CM | POA: Insufficient documentation

## 2015-06-12 LAB — POCT RAPID STREP A: Streptococcus, Group A Screen (Direct): NEGATIVE

## 2015-06-12 NOTE — ED Notes (Signed)
Pt states she was around her niece who had strep throat and now has no voice and sore throat

## 2015-06-12 NOTE — ED Provider Notes (Signed)
[facility] Emergency Department Provider Note    ____________________________________________  Time seen: 1240  I have reviewed the triage vital signs and the nursing notes.   HISTORY  Chief Complaint Sore Throat and Cough   History limited by: Not Limited   HPI BRANDELYN Hoffman is a 81 y.o. female who presents to the emergency department today because of sore throat and losing her voice. She states she has had a sore throat for the past three days. She states it has not effected her breathing or her ability to eat or drink. She does state that she might have been exposed to strep throat prior to her symptoms. Starting today she feels like she has been losing her voice. She denies any fevers.      Past Medical History  Diagnosis Date  . HTN (hypertension)   . HLD (hyperlipidemia)   . Parkinson disease ([place])   . Atrial fibrillation ([place])   . Tachy-brady syndrome ([place])     with medtronic dual chambe rPCM    Patient Active Problem List   Diagnosis Date Noted  . Dyspnea on exertion 08/08/2013  . Chronic diastolic heart failure ([place]) 08/07/2012  . VENOUS HYPERTENSION, CHRONIC W/ INFLAMMATION 07/21/2010  . HYPERTENSION, UNSPECIFIED 07/08/2010  . ATRIAL FIBRILLATION 07/08/2010  . PACEMAKER-Medtronic 07/08/2010    Past Surgical History  Procedure Laterality Date  . Appendectomy    . Hysterectomy (other)    . Gallbladder surgery    . Breast biopsy    . Cervical disc surgery    . Hip surgery      Current Outpatient Rx  Name  Route  Sig  Dispense  Refill  . acetaminophen (TYLENOL 8 HOUR) 650 MG CR tablet   Oral   Take 650 mg by mouth as needed.           . Cholecalciferol (HM VITAMIN D3) 4000 UNITS CAPS   Oral   Take 2,000 Units by mouth daily.          . Cyanocobalamin (B-12 PO)   Oral   Take by mouth daily.         . dabigatran (PRADAXA) 75 MG CAPS capsule   Oral   Take 1 capsule (75 mg total) by mouth 2 (two) times daily.    60 capsule   3   . furosemide (LASIX) 20 MG tablet      Take one 20 mg tablet three times a week as needed   30 tablet   6   . losartan (COZAAR) 25 MG tablet   Oral   Take 1 tablet (25 mg total) by mouth daily.   30 tablet   11   . lovastatin (MEVACOR) 20 MG tablet   Oral   Take 1 tablet (20 mg total) by mouth at bedtime.   30 tablet   6   . meclizine (ANTIVERT) 25 MG tablet   Oral   Take 25 mg by mouth 3 (three) times daily as needed.           . metoprolol (LOPRESSOR) 50 MG tablet   Oral   Take 1 tablet (50 mg total) by mouth 2 (two) times daily.   60 tablet   11   . raloxifene (EVISTA) 60 MG tablet   Oral   Take 60 mg by mouth daily.           Allergies Aspirin; Codeine; Diazepam; Meperidine hcl; and Morphine  Family History  Problem Relation Age of Onset  .  Diabetes Mother   . Coronary artery disease Father   . Heart attack Father     Social History Social History  Substance Use Topics  . Smoking status: Never Smoker   . Smokeless tobacco: None     Comment: tobacco use- no   . Alcohol Use: No    Review of Systems  Constitutional: Negative for fever. Cardiovascular: Negative for chest pain. Respiratory: Negative for shortness of breath. Gastrointestinal: Negative for abdominal pain, vomiting and diarrhea. Genitourinary: Negative for dysuria. Musculoskeletal: Negative for back pain. Skin: Negative for rash. Neurological: Negative for headaches, focal weakness or numbness.   10-point ROS otherwise negative.  ____________________________________________   PHYSICAL EXAM:  VITAL SIGNS: ED Triage Vitals  Enc Vitals Group     BP 06/12/15 1215 133/74 mmHg     Pulse Rate 06/12/15 1215 148     Resp 06/12/15 1215 18     Temp 06/12/15 1215 98.6 F (37 C)     Temp Source 06/12/15 1215 Oral     SpO2 06/12/15 1215 95 %     Weight 06/12/15 1215 150 lb (68.04 kg)     Height 06/12/15 1215 5\' 3"  (1.6 m)     Head Cir --      Peak Flow --       Pain Score 06/12/15 1216 7     Pain Loc --      Pain Edu? --      Excl. in [place]? --     Constitutional: Alert and oriented. Well appearing and in no distress. Eyes: Conjunctivae are normal. PERRL. Normal extraocular movements. ENT   Head: Normocephalic and atraumatic.   Nose: No congestion/rhinnorhea.   Mouth/Throat: Mucous membranes are moist.   Neck: No stridor. Hematological/Lymphatic/Immunilogical: No cervical lymphadenopathy. Cardiovascular: Normal rate, regular rhythm.  No murmurs, rubs, or gallops. Respiratory: Normal respiratory effort without tachypnea nor retractions. Breath sounds are clear and equal bilaterally. No wheezes/rales/rhonchi. Gastrointestinal: Soft and nontender. No distention.  Genitourinary: Deferred Musculoskeletal: Normal range of motion in all extremities. No joint effusions.  No lower extremity tenderness nor edema. Neurologic:  Normal speech and language. No gross focal neurologic deficits are appreciated. Speech is normal.  Skin:  Skin is warm, dry and intact. No rash noted. Psychiatric: Mood and affect are normal. Speech and behavior are normal. Patient exhibits appropriate insight and judgment.  ____________________________________________    LABS (pertinent positives/negatives)  Labs Reviewed  CULTURE, GROUP A STREP ([facility] ONLY)  POCT RAPID STREP A     ____________________________________________   EKG  I, Nance Pear, attending physician, personally viewed and interpreted this EKG  EKG Time: 1220 Rate: 88 Rhythm: atrial fibrillation Axis: left axis deviation Intervals: qtc 421 QRS: narrow ST changes: no st elevation Impression: abnormal ekg ____________________________________________    RADIOLOGY  None   ____________________________________________   PROCEDURES  Procedure(s) performed: None  Critical Care performed: No  ____________________________________________   INITIAL IMPRESSION /  ASSESSMENT AND PLAN / ED COURSE  Pertinent labs & imaging results that were available during my care of the patient were reviewed by me and considered in my medical decision making (see chart for details).  Patient presented to the ER today because of sore throat and losing her voice. Rapid strep was negative. No significant tonsillar swelling or exudate. No fevers. At this point think likely viral cause of the patient's symptoms. Discussed importance of primary care follow up.  ____________________________________________   FINAL CLINICAL IMPRESSION(S) / ED DIAGNOSES  Final diagnoses:  Laryngitis  Nance Pear, MD 06/12/15 [phone]

## 2015-06-12 NOTE — ED Notes (Addendum)
Pt to ED with c/o sore throat and productive cough since yesterday, pt also having some sob when walking

## 2015-06-12 NOTE — Telephone Encounter (Signed)
Patient's daughter Narda Rutherford called office to schedule an acute appt for the patient. Narda Rutherford stated that the patient has had cough for a few days. Today pt has sob upon exertion and is coughing non-stop with weakness. Narda Rutherford stated that the pt can not seem to catch her breath. Patient also coughing up chunks of brown sputum. Advised pt's daughter to take pt to Er for eval.

## 2015-06-12 NOTE — Discharge Instructions (Signed)
Please seek medical attention for any high fevers, chest pain, shortness of breath, change in behavior, persistent vomiting, bloody stool or any other new or concerning symptoms.   Laryngitis Laryngitis is inflammation of your vocal cords. This causes hoarseness, coughing, loss of voice, sore throat, or a dry throat. Your vocal cords are two bands of muscles that are found in your throat. When you speak, these cords come together and vibrate. These vibrations come out through your mouth as sound. When your vocal cords are inflamed, your voice sounds different. Laryngitis can be temporary (acute) or long-term (chronic). Most cases of acute laryngitis improve with time. Chronic laryngitis is laryngitis that lasts for more than three weeks. CAUSES Acute laryngitis may be caused by:  A viral infection.  Lots of talking, yelling, or singing. This is also called vocal strain.  Bacterial infections. Chronic laryngitis may be caused by:  Vocal strain.  Injury to your vocal cords.  Acid reflux (gastroesophageal reflux disease or GERD).  Allergies.  Sinus infection.  Smoking.  Alcohol abuse.  Breathing in chemicals or dust.  Growths on the vocal cords. RISK FACTORS Risk factors for laryngitis include:  Smoking.  Alcohol abuse.  Having allergies. SIGNS AND SYMPTOMS Symptoms of laryngitis may include:  Low, hoarse voice.  Loss of voice.  Dry cough.  Sore throat.  Stuffy nose. DIAGNOSIS Laryngitis may be diagnosed by:  Physical exam.  Throat culture.  Blood test.  Laryngoscopy. This procedure allows your health care provider to look at your vocal cords with a mirror or viewing tube. TREATMENT Treatment for laryngitis depends on what is causing it. Usually, treatment involves resting your voice and using medicines to soothe your throat. However, if your laryngitis is caused by a bacterial infection, you may need to take antibiotic medicine. If your laryngitis is  caused by a growth, you may need to have a procedure to remove it. HOME CARE INSTRUCTIONS  Drink enough fluid to keep your urine clear or pale yellow.  Breathe in moist air. Use a humidifier if you live in a dry climate.  Take medicines only as directed by your health care provider.  If you were prescribed an antibiotic medicine, finish it all even if you start to feel better.  Do not smoke cigarettes or electronic cigarettes. If you need help quitting, ask your health care provider.  Talk as little as possible. Also avoid whispering, which can cause vocal strain.  Write instead of talking. Do this until your voice is back to normal. SEEK MEDICAL CARE IF:  You have a fever.  You have increasing pain.  You have difficulty swallowing. SEEK IMMEDIATE MEDICAL CARE IF:  You cough up blood.  You have trouble breathing.   This information is not intended to replace advice given to you by your health care provider. Make sure you discuss any questions you have with your health care provider.   Document Released: 08/08/2005 Document Revised: 08/29/2014 Document Reviewed: 01/21/2014 Elsevier Interactive Patient Education [employer].

## 2015-06-12 NOTE — ED Notes (Signed)
Pt states she has had a sore throat and cough for three days. States she is coughing up a small amount of brown phlegm but feels like it gets stuck in her throat. Denies fever.

## 2015-06-15 ENCOUNTER — Ambulatory Visit (INDEPENDENT_AMBULATORY_CARE_PROVIDER_SITE_OTHER): Payer: Medicare Other | Admitting: Family Medicine

## 2015-06-15 ENCOUNTER — Encounter: Payer: Self-pay | Admitting: Family Medicine

## 2015-06-15 VITALS — BP 122/74 | HR 93 | Temp 98.7°F | Resp 30 | Wt 155.0 lb

## 2015-06-15 DIAGNOSIS — C44301 Unspecified malignant neoplasm of skin of nose: Secondary | ICD-10-CM | POA: Insufficient documentation

## 2015-06-15 DIAGNOSIS — M25562 Pain in left knee: Secondary | ICD-10-CM | POA: Insufficient documentation

## 2015-06-15 DIAGNOSIS — R42 Dizziness and giddiness: Secondary | ICD-10-CM | POA: Insufficient documentation

## 2015-06-15 DIAGNOSIS — G20 Parkinson's disease: Secondary | ICD-10-CM | POA: Insufficient documentation

## 2015-06-15 DIAGNOSIS — M5136 Other intervertebral disc degeneration, lumbar region: Secondary | ICD-10-CM | POA: Insufficient documentation

## 2015-06-15 DIAGNOSIS — J40 Bronchitis, not specified as acute or chronic: Secondary | ICD-10-CM | POA: Diagnosis not present

## 2015-06-15 DIAGNOSIS — J309 Allergic rhinitis, unspecified: Secondary | ICD-10-CM | POA: Insufficient documentation

## 2015-06-15 DIAGNOSIS — K279 Peptic ulcer, site unspecified, unspecified as acute or chronic, without hemorrhage or perforation: Secondary | ICD-10-CM | POA: Insufficient documentation

## 2015-06-15 DIAGNOSIS — M1990 Unspecified osteoarthritis, unspecified site: Secondary | ICD-10-CM | POA: Insufficient documentation

## 2015-06-15 DIAGNOSIS — N3289 Other specified disorders of bladder: Secondary | ICD-10-CM | POA: Insufficient documentation

## 2015-06-15 DIAGNOSIS — Z86010 Personal history of colonic polyps: Secondary | ICD-10-CM | POA: Insufficient documentation

## 2015-06-15 DIAGNOSIS — R5383 Other fatigue: Secondary | ICD-10-CM | POA: Insufficient documentation

## 2015-06-15 DIAGNOSIS — M51369 Other intervertebral disc degeneration, lumbar region without mention of lumbar back pain or lower extremity pain: Secondary | ICD-10-CM | POA: Insufficient documentation

## 2015-06-15 DIAGNOSIS — D229 Melanocytic nevi, unspecified: Secondary | ICD-10-CM | POA: Insufficient documentation

## 2015-06-15 LAB — CULTURE, GROUP A STREP (THRC)

## 2015-06-15 MED ORDER — AZITHROMYCIN 250 MG PO TABS: ORAL_TABLET | ORAL | Status: AC

## 2015-06-15 NOTE — Progress Notes (Signed)
Patient: Whitney Hoffman Female    DOB: 12/14/1923   81 y.o.   MRN: [id number] Visit Date: 06/15/2015  Today's Provider: Lelon Huh, MD   Chief Complaint  Patient presents with  . Follow-up  . Laryngitis    follow up   Subjective:    HPI   Cough  Complains of 1 week history of cough and sore throat. Cough was intractable and was seen in ER on 10/21  Patient was not prescribed any medication to take. She has been taking OTC Delsym 12hr cough syrup. Patient states she has had cough, sore throat (now improved) x 1 week. Patient denies any chills fevers or sweats. Patient has also had dyspnea on exertion, which is chronic, but a little worse over the last week. Sore throat is a little better since last week, but cough is unchanged. She is unsure if it is productive.  ------------------------------------------------------------------------------------      Allergies  Allergen Reactions  . Aspirin     Sick on stomach  . Codeine     Feel dizzy  . Diazepam     Sick on stomach  . Meperidine Hcl     Sick on stomach   . Morphine     Nausea/sick on stomach   Previous Medications   ACETAMINOPHEN (TYLENOL 8 HOUR) 650 MG CR TABLET    Take 650 mg by mouth as needed.     CHOLECALCIFEROL (HM VITAMIN D3) 4000 UNITS CAPS    Take 2,000 Units by mouth daily.    CYANOCOBALAMIN (B-12 PO)    Take by mouth daily.   DABIGATRAN (PRADAXA) 75 MG CAPS CAPSULE    Take 1 capsule (75 mg total) by mouth 2 (two) times daily.   FUROSEMIDE (LASIX) 20 MG TABLET    Take one 20 mg tablet three times a week as needed   LOSARTAN (COZAAR) 25 MG TABLET    Take 1 tablet (25 mg total) by mouth daily.   LOVASTATIN (MEVACOR) 20 MG TABLET    Take 1 tablet (20 mg total) by mouth at bedtime.   MECLIZINE (ANTIVERT) 25 MG TABLET    Take 25 mg by mouth 3 (three) times daily as needed.     METOPROLOL (LOPRESSOR) 50 MG TABLET    Take 1 tablet (50 mg total) by mouth 2 (two) times daily.   RALOXIFENE (EVISTA) 60 MG  TABLET    Take 60 mg by mouth daily.    Review of Systems  Constitutional: Negative for fever, chills, appetite change and fatigue.  HENT: Positive for congestion, postnasal drip and sore throat. Negative for ear pain, facial swelling, mouth sores, nosebleeds, rhinorrhea, sinus pressure and sneezing.   Eyes: Positive for discharge (watery eyes). Negative for pain, redness and itching.  Respiratory: Positive for cough and shortness of breath (with exertion). Negative for chest tightness.   Cardiovascular: Negative for chest pain and palpitations.  Gastrointestinal: Negative for nausea, vomiting and abdominal pain.  Neurological: Positive for light-headedness (occurs when standing). Negative for dizziness and weakness.    Social History  Substance Use Topics  . Smoking status: Never Smoker   . Smokeless tobacco: Not on file     Comment: tobacco use- no   . Alcohol Use: No   Objective:   BP 122/74 mmHg  Pulse 93  Temp(Src) 98.7 F (37.1 C) (Oral)  Resp 30  Wt 155 lb (70.308 kg)  SpO2 92%  Physical Exam  General Appearance:    Alert, cooperative,  no distress  HENT:   bilateral TM normal without fluid or infection, neck without nodes, sinuses nontender, post nasal drip noted and nasal mucosa pale and congested  Eyes:    PERRL, conjunctiva/corneas clear, EOM's intact       Lungs:     Occasional expiratory wheeze throughout. No rales. , respirations unlabored  Heart:    Regular rate and rhythm  Neurologic:   Awake, alert, oriented x 3. No apparent focal neurological           defect.            Assessment & Plan:     1. Bronchitis She declined prescription cough medications.  - azithromycin (ZITHROMAX) 250 MG tablet; 2 by mouth today, then 1 daily for 4 days  Dispense: 6 tablet; Refill: 0   Call if symptoms change or if not rapidly improving.          Lelon Huh, MD  [facility]

## 2015-06-16 ENCOUNTER — Ambulatory Visit: Payer: Self-pay | Admitting: Family Medicine

## 2015-06-17 ENCOUNTER — Encounter: Payer: Self-pay | Admitting: Family Medicine

## 2015-07-21 ENCOUNTER — Telehealth: Payer: Self-pay | Admitting: Cardiology

## 2015-07-21 ENCOUNTER — Ambulatory Visit (INDEPENDENT_AMBULATORY_CARE_PROVIDER_SITE_OTHER): Payer: Medicare Other | Admitting: *Deleted

## 2015-07-21 DIAGNOSIS — I495 Sick sinus syndrome: Secondary | ICD-10-CM

## 2015-07-21 NOTE — Telephone Encounter (Signed)
Spoke with pt and reminded pt of remote transmission that is due today. Pt verbalized understanding.   

## 2015-07-22 ENCOUNTER — Other Ambulatory Visit: Payer: Self-pay | Admitting: *Deleted

## 2015-07-22 MED ORDER — DABIGATRAN ETEXILATE MESYLATE 75 MG PO CAPS: 75.0000 mg | ORAL_CAPSULE | Freq: Two times a day (BID) | ORAL | Status: DC

## 2015-07-23 NOTE — Progress Notes (Signed)
Remote pacemaker transmission.   

## 2015-07-30 LAB — CUP PACEART REMOTE DEVICE CHECK
Battery Remaining Longevity: 86 mo
Brady Statistic AP VP Percent: 0 %
Brady Statistic AP VS Percent: 0 %
Brady Statistic AS VP Percent: 0 %
Brady Statistic AS VS Percent: 99 %
Date Time Interrogation Session: 20161129225244
Implantable Lead Implant Date: 20111108
Implantable Lead Location: 753860
Lead Channel Setting Pacing Amplitude: 2 V
Lead Channel Setting Pacing Amplitude: 2.5 V
Lead Channel Setting Pacing Pulse Width: 0.4 ms
Lead Channel Setting Sensing Sensitivity: 4 mV
MDC IDC LEAD IMPLANT DT: 20111108
MDC IDC LEAD LOCATION: 753859
MDC IDC MSMT BATTERY IMPEDANCE: 445 Ohm
MDC IDC MSMT BATTERY VOLTAGE: 2.79 V
MDC IDC MSMT LEADCHNL RA IMPEDANCE VALUE: 506 Ohm
MDC IDC MSMT LEADCHNL RV IMPEDANCE VALUE: 536 Ohm

## 2015-07-31 ENCOUNTER — Encounter: Payer: Self-pay | Admitting: Cardiology

## 2015-08-26 ENCOUNTER — Other Ambulatory Visit: Payer: Self-pay | Admitting: *Deleted

## 2015-08-26 MED ORDER — OXYBUTYNIN CHLORIDE ER 5 MG PO TB24: 5.0000 mg | ORAL_TABLET | Freq: Every day | ORAL | Status: DC

## 2015-09-25 ENCOUNTER — Telehealth: Payer: Self-pay | Admitting: *Deleted

## 2015-09-25 NOTE — Telephone Encounter (Signed)
Pt requiring PA for Pradaxa 75 mg.  PA has been filled out and forwarded through Cover My Meds. Awaiting response

## 2015-09-28 ENCOUNTER — Other Ambulatory Visit: Payer: Self-pay

## 2015-09-28 ENCOUNTER — Other Ambulatory Visit: Payer: Self-pay | Admitting: *Deleted

## 2015-09-28 MED ORDER — LOVASTATIN 20 MG PO TABS: 20.0000 mg | ORAL_TABLET | Freq: Every day | ORAL | Status: DC

## 2015-09-28 MED ORDER — DABIGATRAN ETEXILATE MESYLATE 75 MG PO CAPS: 75.0000 mg | ORAL_CAPSULE | Freq: Two times a day (BID) | ORAL | Status: DC

## 2015-09-29 ENCOUNTER — Telehealth: Payer: Self-pay

## 2015-09-29 NOTE — Telephone Encounter (Signed)
Spoke with Jerene Pitch regarding a phone number to contact for prior authorization for Pradaxa [phone].

## 2015-09-29 NOTE — Telephone Encounter (Signed)
Spoke with Zack with [employer] for prior authorization for Pradaxa 75 mg twice a day, has been approved for 12 months starting today 09-29-2015 to 09-28-2016.

## 2015-09-29 NOTE — Telephone Encounter (Signed)
S/w pt who states she has been out of Pradaxa for "2 or 3 days". Per pharmacy, this med requiring pre-authorization. Placed samples at front desk. Pt states she will have her daughter pick them up. Forwarded message to Dolores Lory for pre-auth.  Samples of this drug were given to the patient, quantity 3 boxes, Lot Number [id number] Exp: 10/18

## 2015-09-29 NOTE — Telephone Encounter (Signed)
Pt pharmacist states pt Pradaxa is requiring pre auth. After today, pt is out of meds. Please call. ASAP

## 2015-10-20 ENCOUNTER — Telehealth: Payer: Self-pay | Admitting: Cardiology

## 2015-10-20 ENCOUNTER — Ambulatory Visit (INDEPENDENT_AMBULATORY_CARE_PROVIDER_SITE_OTHER): Payer: Medicare Other | Admitting: *Deleted

## 2015-10-20 DIAGNOSIS — I495 Sick sinus syndrome: Secondary | ICD-10-CM

## 2015-10-20 NOTE — Telephone Encounter (Signed)
Spoke with pt and reminded pt of remote transmission that is due today. Pt verbalized understanding.   

## 2015-10-21 NOTE — Progress Notes (Signed)
Remote pacemaker transmission.   

## 2015-11-20 ENCOUNTER — Encounter: Payer: Self-pay | Admitting: Cardiology

## 2015-11-20 LAB — CUP PACEART REMOTE DEVICE CHECK
Battery Impedance: 496 Ohm
Brady Statistic AP VS Percent: 0 %
Brady Statistic AS VP Percent: 0 %
Brady Statistic AS VS Percent: 99 %
Implantable Lead Location: 753859
Implantable Lead Model: 5076
Implantable Lead Model: 5076
Lead Channel Impedance Value: 558 Ohm
Lead Channel Pacing Threshold Pulse Width: 0.4 ms
Lead Channel Sensing Intrinsic Amplitude: 1 mV
Lead Channel Sensing Intrinsic Amplitude: 11.2 mV
Lead Channel Setting Sensing Sensitivity: 5.6 mV
MDC IDC LEAD IMPLANT DT: 20111108
MDC IDC LEAD IMPLANT DT: 20111108
MDC IDC LEAD LOCATION: 753860
MDC IDC MSMT BATTERY REMAINING LONGEVITY: 81 mo
MDC IDC MSMT BATTERY VOLTAGE: 2.78 V
MDC IDC MSMT LEADCHNL RA IMPEDANCE VALUE: 514 Ohm
MDC IDC MSMT LEADCHNL RV PACING THRESHOLD AMPLITUDE: 0.625 V
MDC IDC SESS DTM: 20170301141123
MDC IDC SET LEADCHNL RA PACING AMPLITUDE: 2 V
MDC IDC SET LEADCHNL RV PACING AMPLITUDE: 2.5 V
MDC IDC SET LEADCHNL RV PACING PULSEWIDTH: 0.4 ms
MDC IDC STAT BRADY AP VP PERCENT: 0 %

## 2015-11-24 ENCOUNTER — Ambulatory Visit
Admission: RE | Admit: 2015-11-24 | Discharge: 2015-11-24 | Disposition: A | Discharge status: Home / Self Care | Payer: Medicare Other | Source: Ambulatory Visit | Attending: Family Medicine | Admitting: Family Medicine

## 2015-11-24 ENCOUNTER — Encounter: Payer: Self-pay | Admitting: Family Medicine

## 2015-11-24 ENCOUNTER — Ambulatory Visit (INDEPENDENT_AMBULATORY_CARE_PROVIDER_SITE_OTHER): Payer: Medicare Other | Admitting: Family Medicine

## 2015-11-24 VITALS — BP 122/70 | HR 83 | Temp 98.9°F | Resp 16 | Ht 63.0 in | Wt 150.0 lb

## 2015-11-24 DIAGNOSIS — M419 Scoliosis, unspecified: Secondary | ICD-10-CM | POA: Diagnosis not present

## 2015-11-24 DIAGNOSIS — G20 Parkinson's disease: Secondary | ICD-10-CM | POA: Diagnosis not present

## 2015-11-24 DIAGNOSIS — I5032 Chronic diastolic (congestive) heart failure: Secondary | ICD-10-CM | POA: Diagnosis not present

## 2015-11-24 DIAGNOSIS — M25551 Pain in right hip: Secondary | ICD-10-CM

## 2015-11-24 DIAGNOSIS — E7800 Pure hypercholesterolemia, unspecified: Secondary | ICD-10-CM

## 2015-11-24 DIAGNOSIS — Z0000 Encounter for general adult medical examination without abnormal findings: Secondary | ICD-10-CM

## 2015-11-24 DIAGNOSIS — M810 Age-related osteoporosis without current pathological fracture: Secondary | ICD-10-CM | POA: Diagnosis not present

## 2015-11-24 DIAGNOSIS — M479 Spondylosis, unspecified: Secondary | ICD-10-CM | POA: Insufficient documentation

## 2015-11-24 DIAGNOSIS — M545 Low back pain, unspecified: Secondary | ICD-10-CM

## 2015-11-24 DIAGNOSIS — I4891 Unspecified atrial fibrillation: Secondary | ICD-10-CM | POA: Diagnosis not present

## 2015-11-24 DIAGNOSIS — Z23 Encounter for immunization: Secondary | ICD-10-CM | POA: Diagnosis not present

## 2015-11-24 DIAGNOSIS — I10 Essential (primary) hypertension: Secondary | ICD-10-CM

## 2015-11-24 NOTE — Progress Notes (Signed)
Patient: Whitney Hoffman, Female    DOB: [birth year], 82 y.o.   MRN: [id number] Visit Date: 11/24/2015  Today's Provider: Lelon Huh, MD   Chief Complaint  Patient presents with  . Medicare Wellness  . Hypertension  . Hyperlipidemia  . Atrial Fibrillation   Subjective:    Annual wellness visit Whitney Hoffman is a 82 y.o. female. She feels fairly well. She reports exercising yes/steps. She reports she is sleeping poorly.  -----------------------------------------------------------   Follow-up for A-Fib from 09/09/2013; no changes. Continue to follow-up with cardiologist.    Follow-up for osteoporosis from 09/09/2013; BMD ordered, shows osteoporosis is worse in hip. Patient was started on Evista 60 mg qd.    Follow-up for Parkinson's Diease from 09/09/2013; no changes.Followed by  Neurology.     Hypertension, follow-up:  BP Readings from Last 3 Encounters:  11/24/15 122/70  06/15/15 122/74  06/12/15 103/56    She was last seen for hypertension 05/13/2014.  BP at that visit was 120/68. Management since that visit includes; no changes.She reports good compliance with treatment. She is not having side effects. none  She is exercising. She is adherent to low salt diet.   Outside blood pressures are normal. She is experiencing chest pain.  Patient denies all.   Cardiovascular risk factors include advanced age (older than 14 for men, 7 for women).  Use of agents associated with hypertension: none  ----------------------------------------------------------------------    Lipid/Cholesterol, Follow-up:   Last seen for this 05/13/2014.  Management since that visit includes; no changes. .  Last Lipid Panel:    Component Value Date/Time   CHOL 154 05/16/2014   TRIG 99 05/16/2014   HDL 54 05/16/2014   LDLCALC 80 05/16/2014    She reports good compliance with treatment. She is not having side effects. none  Wt Readings from Last 3 Encounters:  11/24/15  150 lb (68.04 kg)  06/15/15 155 lb (70.308 kg)  06/12/15 150 lb (68.04 kg)    ----------------------------------------------------------------------  Is having persistent pain on right side of her hip for several months, which worsens when standing for 15-20 minutes.    Review of Systems  Constitutional: Positive for fatigue.  HENT: Positive for hearing loss, postnasal drip, tinnitus, trouble swallowing and voice change.   Eyes: Positive for photophobia, discharge, redness and itching.  Respiratory: Positive for chest tightness.   Cardiovascular: Positive for chest pain.  Gastrointestinal: Positive for abdominal pain.  Endocrine: Positive for cold intolerance and polyuria.  Genitourinary: Positive for enuresis.  Musculoskeletal: Positive for myalgias, back pain, arthralgias and gait problem.  Skin: Positive for color change.  Allergic/Immunologic: Negative.   Neurological: Positive for dizziness, tremors, weakness and light-headedness.  Hematological: Negative.   Psychiatric/Behavioral: Negative.     Social History   Social History  . Marital Status: Widowed    Spouse Name: N/A  . Number of Children: 4  . Years of Education: N/A   Occupational History  . 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 Retired at age 51   Social History Main Topics  . Smoking status: Never Smoker   . Smokeless tobacco: Not on file     Comment: tobacco use- no   . Alcohol Use: No  . Drug Use: No  . Sexual Activity: Not on file   Other Topics Concern  . Not on file   Social History Narrative  Lives in [place]. Widowed. Retired. Lives alone     Past Medical History  Diagnosis Date  . Tachy-brady syndrome ([place])     with medtronic dual chambe rPCM     Patient Active Problem List   Diagnosis Date Noted  . Allergic rhinitis 06/15/2015  . Arthritis 06/15/2015  . History of colonic polyps 06/15/2015  . DDD (degenerative  disc disease), lumbar 06/15/2015  . Fatigue 06/15/2015  . Irritable bladder 06/15/2015  . Left knee pain 06/15/2015  . Nevus 06/15/2015  . Parkinson's disease ([place]) 06/15/2015  . Peptic ulcer disease 06/15/2015  . Skin cancer of nose 06/15/2015  . Vertigo 06/15/2015  . Dyspnea on exertion 08/08/2013  . Chronic diastolic heart failure ([place]) 08/07/2012  . VENOUS HYPERTENSION, CHRONIC W/ INFLAMMATION 07/21/2010  . HYPERTENSION, UNSPECIFIED 07/08/2010  . ATRIAL FIBRILLATION 07/08/2010  . PACEMAKER-Medtronic 07/08/2010  . Senile osteoporosis 10/18/2009  . Pure hypercholesterolemia 10/05/2009    Past Surgical History  Procedure Laterality Date  . Gallbladder surgery    . Cervical disc surgery    . Wrist surgery  1996    Traumatic B  . Pacemaker placement  06/29/2010  . Breast biopsy  2010    left benign repeat ultrasound recommneded in 2011 [facility] [place]  . Hip surgery Left     ORIF x 2; post op infection Left hip  . Cholecystectomy  1969  . Abdominal hysterectomy  1966    fibroids ovaries removed secondary to ovarian cyst  . Appendectomy  1944    Her family history includes Alzheimer's disease in her father; Coronary artery disease in her father; Diabetes in her mother; Heart attack in her father.    Previous Medications   ACETAMINOPHEN (TYLENOL 8 HOUR) 650 MG CR TABLET    Take 650 mg by mouth as needed.     CHOLECALCIFEROL (HM VITAMIN D3) 4000 UNITS CAPS    Take 2,000 Units by mouth daily.    CYANOCOBALAMIN (B-12 PO)    Take by mouth daily.   DABIGATRAN (PRADAXA) 75 MG CAPS CAPSULE    Take 1 capsule (75 mg total) by mouth 2 (two) times daily.   FUROSEMIDE (LASIX) 20 MG TABLET    Take one 20 mg tablet three times a week as needed   LOSARTAN (COZAAR) 25 MG TABLET    Take 1 tablet (25 mg total) by mouth daily.   LOVASTATIN (MEVACOR) 20 MG TABLET    Take 1 tablet (20 mg total) by mouth at bedtime.   MECLIZINE (ANTIVERT) 25 MG TABLET    Take 25 mg by mouth 3 (three) times daily  as needed.     METOPROLOL (LOPRESSOR) 50 MG TABLET    Take 1 tablet (50 mg total) by mouth 2 (two) times daily.   OXYBUTYNIN (DITROPAN-XL) 5 MG 24 HR TABLET    Take 1 tablet (5 mg total) by mouth daily.   RALOXIFENE (EVISTA) 60 MG TABLET    Take 60 mg by mouth daily.    Patient Care Team: Birdie Sons, MD as PCP - General (Family Medicine) Birdie Sons, MD as Referring Physician (Family Medicine)     Objective:   Vitals: BP 122/70 mmHg  Pulse 83  Temp(Src) 98.9 F (37.2 C) (Oral)  Resp 16  Ht 5\' 3"  (1.6 m)  Wt 150 lb (68.04 kg)  BMI 26.58 kg/m2  SpO2 96%  Physical Exam   General Appearance:    Alert, cooperative, no distress  Eyes:    PERRL, conjunctiva/corneas clear, EOM's  intact       Lungs:     Clear to auscultation bilaterally, respirations unlabored  Heart:     Irregularly irregular rhythm. Normal rate   Neurologic:   Awake, alert, oriented x 3. No apparent focal neurological           defect. Fine resting tremor right hand.   MS:    Tender right lateral hip from iliac crest to greater trochanter, with pain radiating into right groin.        Activities of Daily Living In your present state of health, do you have any difficulty performing the following activities: 11/24/2015  Hearing? N  Vision? Y  Difficulty concentrating or making decisions? N  Walking or climbing stairs? Y  Dressing or bathing? Y  Doing errands, shopping? Y    Fall Risk Assessment Fall Risk  11/24/2015 06/15/2015  Falls in the past year? No Yes  Number falls in past yr: - 1  Injury with Fall? - Yes  Follow up - Falls evaluation completed     Depression Screen PHQ 2/9 Scores 11/24/2015 06/15/2015  PHQ - 2 Score 0 0    Cognitive Testing - 6-CIT  Correct? Score   What year is it? yes 0 0 or 4  What month is it? yes 0 0 or 3  Memorize:    Pia Mau,  44,  [address],  [place],      What time is it? (within 1 hour) yes 0 0 or 3  Count backwards from 20 yes 0 0, 2, or 4  Name the  months of the year yes 0 0, 2, or 4  Repeat name & address above yes 0 0, 2, 4, 6, 8, or 10       TOTAL SCORE  0/28   Interpretation:  Normal  Normal (0-7) Abnormal (8-28)    Audit-C Alcohol Use Screening  Question Answer Points  How often do you have alcoholic drink? never 0  On days you do drink alcohol, how many drinks do you typically consume? n/a 0  How oftey will you drink 6 or more in a total? never 0  Total Score:  0   A score of 3 or more in women, and 4 or more in men indicates increased risk for alcohol abuse, EXCEPT if all of the points are from question 1.     Assessment & Plan:     Annual Wellness Visit  Reviewed patient's Family Medical History Reviewed and updated list of patient's medical providers Assessment of cognitive impairment was done Assessed patient's functional ability Established a written schedule for health screening [place] Completed and Reviewed  Exercise Activities and Dietary recommendations Goals    None      Immunization History  Administered Date(s) Administered  . Pneumococcal Conjugate-13 05/13/2014  . Tdap 09/30/2010  . Zoster 05/22/2012    Health Maintenance  Topic Date Due  . PNA vac Low Risk Adult (2 of 2 - PPSV23) 05/14/2015  . INFLUENZA VACCINE  03/22/2016  . TETANUS/TDAP  09/30/2020  . DEXA SCAN  Completed  . ZOSTAVAX  Completed      Discussed health benefits of physical activity, and encouraged her to engage in regular exercise appropriate for her age and condition.    --------------------------------------------------------------------------------   1. Medicare annual wellness visit, subsequent   2. Atrial fibrillation, unspecified type ([place]) Asymptomatic. Compliant with medication.  Continue aggressive risk factor modification.   - CBC  3. Chronic diastolic  heart failure ([place]) Continue routine follow up with cardiology  4. Essential hypertension Well controlled.  Continue  current medications.   - Renal function panel  5. Parkinson's disease ([place]) Stable. Continue routine follow up with Dr. Manuella Ghazi  6. Pure hypercholesterolemia She is tolerating lovastatin well with no adverse effects.   - Lipid panel - Hepatic function panel  7. Senile osteoporosis Stable.   8. Need for pneumococcal vaccination  - DG Lumbar Spine Complete; Future  9. Hip pain, right Sees to more of bursitis than intrinsic joint pain. Consider trial of oral or injected steroid if joint space looks good on XR - DG HIP UNILAT W OR W/O PELVIS 2-3 VIEWS RIGHT; Future  10. Right-sided low back pain without sciatica  - DG Lumbar Spine Complete; Future

## 2015-11-25 ENCOUNTER — Telehealth: Payer: Self-pay

## 2015-11-25 ENCOUNTER — Other Ambulatory Visit: Payer: Self-pay | Admitting: Family Medicine

## 2015-11-25 MED ORDER — PREDNISONE 10 MG PO TABS: ORAL_TABLET | ORAL | Status: AC

## 2015-11-25 NOTE — Telephone Encounter (Signed)
-----   Message from Birdie Sons, MD sent at 11/25/2015  8:13 AM EDT ----- Minimal amount of arthritis in hip. There is arthritis and scoliosis in lumbar spine. This is probably affecting her gait can straining muscles and tendons around her hip causing pain. She would probably due well with a course of prednisone or a cortisone shot. Let me know if she wants to try either of these. Marland Kitchen

## 2015-11-25 NOTE — Telephone Encounter (Signed)
Patient advised as directed below. Patient verbalized understanding. Patient states she would prefer trying the prednisone first. Pharmacy: [facility].

## 2015-11-26 DIAGNOSIS — H3554 Dystrophies primarily involving the retinal pigment epithelium: Secondary | ICD-10-CM | POA: Diagnosis not present

## 2015-12-02 DIAGNOSIS — I10 Essential (primary) hypertension: Secondary | ICD-10-CM | POA: Diagnosis not present

## 2015-12-02 DIAGNOSIS — I4891 Unspecified atrial fibrillation: Secondary | ICD-10-CM | POA: Diagnosis not present

## 2015-12-02 DIAGNOSIS — E7800 Pure hypercholesterolemia, unspecified: Secondary | ICD-10-CM | POA: Diagnosis not present

## 2015-12-03 LAB — LIPID PANEL
CHOLESTEROL TOTAL: 160 mg/dL (ref 100–199)
Chol/HDL Ratio: 2.8 ratio units (ref 0.0–4.4)
HDL: 58 mg/dL (ref >39)
LDL CALC: 80 mg/dL (ref 0–99)
Triglycerides: 110 mg/dL (ref 0–149)
VLDL CHOLESTEROL CAL: 22 mg/dL (ref 5–40)

## 2015-12-03 LAB — HEPATIC FUNCTION PANEL
ALT: 15 IU/L (ref 0–32)
AST: 20 IU/L (ref 0–40)
Alkaline Phosphatase: 55 IU/L (ref 39–117)
BILIRUBIN TOTAL: 0.6 mg/dL (ref 0.0–1.2)
Bilirubin, Direct: 0.18 mg/dL (ref 0.00–0.40)
Total Protein: 6.7 g/dL (ref 6.0–8.5)

## 2015-12-03 LAB — CBC
Hematocrit: 43.7 % (ref 34.0–46.6)
Hemoglobin: 14.5 g/dL (ref 11.1–15.9)
MCH: 30.1 pg (ref 26.6–33.0)
MCHC: 33.2 g/dL (ref 31.5–35.7)
MCV: 91 fL (ref 79–97)
PLATELETS: 273 10*3/uL (ref 150–379)
RBC: 4.82 x10E6/uL (ref 3.77–5.28)
RDW: 14.6 % (ref 12.3–15.4)
WBC: 10.4 10*3/uL (ref 3.4–10.8)

## 2015-12-03 LAB — RENAL FUNCTION PANEL
Albumin: 4.2 g/dL (ref 3.5–4.7)
BUN / CREAT RATIO: 28 (ref 12–28)
BUN: 27 mg/dL (ref 8–27)
CALCIUM: 9.5 mg/dL (ref 8.7–10.3)
CO2: 28 mmol/L (ref 18–29)
CREATININE: 0.98 mg/dL (ref 0.57–1.00)
Chloride: 97 mmol/L (ref 96–106)
GFR calc Af Amer: 59 mL/min/{1.73_m2} — ABNORMAL LOW (ref >59)
GFR calc non Af Amer: 51 mL/min/{1.73_m2} — ABNORMAL LOW (ref >59)
GLUCOSE: 84 mg/dL (ref 65–99)
PHOSPHORUS: 3.1 mg/dL (ref 2.5–4.5)
POTASSIUM: 5.4 mmol/L — AB (ref 3.5–5.2)
SODIUM: 141 mmol/L (ref 134–144)

## 2015-12-03 NOTE — Progress Notes (Signed)
Advised  ED 

## 2016-01-20 ENCOUNTER — Ambulatory Visit: Payer: Medicare Other | Admitting: *Deleted

## 2016-01-20 ENCOUNTER — Telehealth: Payer: Self-pay | Admitting: Cardiology

## 2016-01-20 NOTE — Telephone Encounter (Signed)
Spoke with pt and reminded pt of remote transmission that is due today. Pt verbalized understanding.   

## 2016-01-22 ENCOUNTER — Encounter: Payer: Self-pay | Admitting: Emergency Medicine

## 2016-01-22 ENCOUNTER — Emergency Department
Admission: EM | Admit: 2016-01-22 | Discharge: 2016-01-23 | Disposition: A | Discharge status: Home / Self Care | Payer: Medicare Other | Attending: Emergency Medicine | Admitting: Emergency Medicine

## 2016-01-22 ENCOUNTER — Emergency Department: Payer: Medicare Other

## 2016-01-22 ENCOUNTER — Encounter: Payer: Self-pay | Admitting: Cardiology

## 2016-01-22 DIAGNOSIS — R110 Nausea: Secondary | ICD-10-CM | POA: Diagnosis not present

## 2016-01-22 DIAGNOSIS — I5032 Chronic diastolic (congestive) heart failure: Secondary | ICD-10-CM | POA: Diagnosis not present

## 2016-01-22 DIAGNOSIS — G20 Parkinson's disease: Secondary | ICD-10-CM | POA: Insufficient documentation

## 2016-01-22 DIAGNOSIS — M5136 Other intervertebral disc degeneration, lumbar region: Secondary | ICD-10-CM | POA: Diagnosis not present

## 2016-01-22 DIAGNOSIS — M810 Age-related osteoporosis without current pathological fracture: Secondary | ICD-10-CM | POA: Diagnosis not present

## 2016-01-22 DIAGNOSIS — M1990 Unspecified osteoarthritis, unspecified site: Secondary | ICD-10-CM | POA: Diagnosis not present

## 2016-01-22 DIAGNOSIS — I4891 Unspecified atrial fibrillation: Secondary | ICD-10-CM | POA: Insufficient documentation

## 2016-01-22 DIAGNOSIS — R531 Weakness: Secondary | ICD-10-CM

## 2016-01-22 DIAGNOSIS — Z85828 Personal history of other malignant neoplasm of skin: Secondary | ICD-10-CM | POA: Diagnosis not present

## 2016-01-22 DIAGNOSIS — Z950 Presence of cardiac pacemaker: Secondary | ICD-10-CM | POA: Insufficient documentation

## 2016-01-22 DIAGNOSIS — Z79899 Other long term (current) drug therapy: Secondary | ICD-10-CM | POA: Insufficient documentation

## 2016-01-22 DIAGNOSIS — R079 Chest pain, unspecified: Secondary | ICD-10-CM | POA: Diagnosis not present

## 2016-01-22 DIAGNOSIS — M6281 Muscle weakness (generalized): Secondary | ICD-10-CM | POA: Diagnosis not present

## 2016-01-22 DIAGNOSIS — R930 Abnormal findings on diagnostic imaging of skull and head, not elsewhere classified: Secondary | ICD-10-CM | POA: Diagnosis not present

## 2016-01-22 DIAGNOSIS — Z7901 Long term (current) use of anticoagulants: Secondary | ICD-10-CM | POA: Diagnosis not present

## 2016-01-22 DIAGNOSIS — I110 Hypertensive heart disease with heart failure: Secondary | ICD-10-CM | POA: Diagnosis not present

## 2016-01-22 DIAGNOSIS — K5900 Constipation, unspecified: Secondary | ICD-10-CM | POA: Diagnosis not present

## 2016-01-22 LAB — TROPONIN I: Troponin I: <0.03 ng/mL (ref <0.031)

## 2016-01-22 LAB — CBC
HCT: 44.8 % (ref 35.0–47.0)
HEMOGLOBIN: 14.5 g/dL (ref 12.0–16.0)
MCH: 30.1 pg (ref 26.0–34.0)
MCHC: 32.5 g/dL (ref 32.0–36.0)
MCV: 92.7 fL (ref 80.0–100.0)
Platelets: 248 10*3/uL (ref 150–440)
RBC: 4.83 MIL/uL (ref 3.80–5.20)
RDW: 15.1 % — ABNORMAL HIGH (ref 11.5–14.5)
WBC: 16.2 10*3/uL — AB (ref 3.6–11.0)

## 2016-01-22 LAB — LACTIC ACID, PLASMA: LACTIC ACID, VENOUS: 1.8 mmol/L (ref 0.5–2.0)

## 2016-01-22 LAB — LIPASE, BLOOD: Lipase: 23 U/L (ref 11–51)

## 2016-01-22 LAB — PROTIME-INR
INR: 1.29
Prothrombin Time: 16.2 seconds — ABNORMAL HIGH (ref 11.4–15.0)

## 2016-01-22 MED ORDER — METOPROLOL TARTRATE 5 MG/5ML IV SOLN
5.0000 mg | Freq: Once | INTRAVENOUS | Status: AC
  Administered 2016-01-23: 5 mg via INTRAVENOUS
  Filled 2016-01-22: qty 5

## 2016-01-22 MED ORDER — SODIUM CHLORIDE 0.9 % IV BOLUS (SEPSIS)
500.0000 mL | Freq: Once | INTRAVENOUS | Status: AC
  Administered 2016-01-23: 500 mL via INTRAVENOUS

## 2016-01-22 NOTE — ED Provider Notes (Signed)
[facility] Emergency Department Provider Note  ____________________________________________  Time seen: Approximately 11:04 PM  I have reviewed the triage vital signs and the nursing notes.   HISTORY  Chief Complaint Weakness and Nausea    HPI IONNA SESSOM is a 82 y.o. female presents for evaluation of generalized weakness, also a strange pressure sensation felt across her chest throughout the day today. Patient reports feeling generally weak, also had difficulty with bowel movement and felt constipated the last couple days but had a large stool today with relief.  First generally feeling tired, feels like she is under "stress" from keeping up with her family and her birthday which is today.  Denies any nausea or vomiting. No chest pain or shortness of breath but does report a mild substernal pressure.  Had abdominal discomfort described as constipation which is now been relieved.  No leg swelling or edema. No fever. Trouble with urination. No falls or injuries.   Past Medical History  Diagnosis Date  . Tachy-brady syndrome ([place])     with medtronic dual chambe rPCM  . Atrial fibrillation ([place])   . Parkinson disease [facility])     Patient Active Problem List   Diagnosis Date Noted  . Allergic rhinitis 06/15/2015  . Arthritis 06/15/2015  . History of colonic polyps 06/15/2015  . DDD (degenerative disc disease), lumbar 06/15/2015  . Fatigue 06/15/2015  . Irritable bladder 06/15/2015  . Left knee pain 06/15/2015  . Nevus 06/15/2015  . Parkinson's disease ([place]) 06/15/2015  . Peptic ulcer disease 06/15/2015  . Skin cancer of nose 06/15/2015  . Vertigo 06/15/2015  . Dyspnea on exertion 08/08/2013  . Chronic diastolic heart failure ([place]) 08/07/2012  . VENOUS HYPERTENSION, CHRONIC W/ INFLAMMATION 07/21/2010  . Essential hypertension 07/08/2010  . ATRIAL FIBRILLATION 07/08/2010  . PACEMAKER-Medtronic 07/08/2010  . Senile osteoporosis 10/18/2009  .  Pure hypercholesterolemia 10/05/2009    Past Surgical History  Procedure Laterality Date  . Gallbladder surgery    . Cervical disc surgery    . Wrist surgery  1996    Traumatic B  . Pacemaker placement  06/29/2010  . Breast biopsy  2010    left benign repeat ultrasound recommneded in 2011 [facility]/ [place]  . Hip surgery Left     ORIF x 2; post op infection Left hip  . Cholecystectomy  1969  . Abdominal hysterectomy  1966    fibroids ovaries removed secondary to ovarian cyst  . Appendectomy  1944    Current Outpatient Rx  Name  Route  Sig  Dispense  Refill  . acetaminophen (TYLENOL 8 HOUR) 650 MG CR tablet   Oral   Take 650 mg by mouth as needed.           . Cholecalciferol (HM VITAMIN D3) 4000 UNITS CAPS   Oral   Take 2,000 Units by mouth daily.          . Cyanocobalamin (B-12 PO)   Oral   Take by mouth daily.         . dabigatran (PRADAXA) 75 MG CAPS capsule   Oral   Take 1 capsule (75 mg total) by mouth 2 (two) times daily.   60 capsule   3   . furosemide (LASIX) 20 MG tablet      Take one 20 mg tablet three times a week as needed   30 tablet   6   . losartan (COZAAR) 25 MG tablet   Oral   Take 1 tablet (25  mg total) by mouth daily.   30 tablet   11   . lovastatin (MEVACOR) 20 MG tablet   Oral   Take 1 tablet (20 mg total) by mouth at bedtime.   30 tablet   12   . meclizine (ANTIVERT) 25 MG tablet   Oral   Take 25 mg by mouth 3 (three) times daily as needed.           . metoprolol (LOPRESSOR) 50 MG tablet   Oral   Take 1 tablet (50 mg total) by mouth 2 (two) times daily.   60 tablet   11   . oxybutynin (DITROPAN-XL) 5 MG 24 hr tablet   Oral   Take 1 tablet (5 mg total) by mouth daily.   30 tablet   12   . raloxifene (EVISTA) 60 MG tablet      TAKE 1 TABLET BY MOUTH ONCE DAILY   30 tablet   5     Allergies Aspirin; Codeine; Diazepam; Meperidine hcl; Morphine; and Propoxyphene  Family History  Problem Relation Age of Onset   . Diabetes Mother   . Coronary artery disease Father   . Heart attack Father   . Alzheimer's disease Father     Social History Social History  Substance Use Topics  . Smoking status: Never Smoker   . Smokeless tobacco: None     Comment: tobacco use- no   . Alcohol Use: No    Review of Systems Constitutional: No fever/chills Eyes: No visual changes. ENT: No sore throat. Cardiovascular: Denies chest pain. See history of present illness Respiratory: Denies shortness of breath. Gastrointestinal: No abdominal pain now but did feel "constipated" earlier which was relieved after a large bowel movement.  No nausea, no vomiting.  No diarrhea.  She reports she takes MiraLAX daily for same. Genitourinary: Negative for dysuria. Musculoskeletal: Negative for back pain. Skin: Negative for rash. Neurological: Negative for headaches, focal weakness or numbness. He reports she speaking normally, not noticing any weakness on one side or the other.  10-point ROS otherwise negative.  ____________________________________________   PHYSICAL EXAM:  VITAL SIGNS: ED Triage Vitals  Enc Vitals Group     BP 01/22/16 2239 126/91 mmHg     Pulse --      Resp --      Temp 01/22/16 2239 98 F (36.7 C)     Temp src --      SpO2 --      Weight 01/22/16 2239 150 lb (68.04 kg)     Height 01/22/16 2239 5\' 3"  (1.6 m)     Head Cir --      Peak Flow --      Pain Score --      Pain Loc --      Pain Edu? --      Excl. in [place]? --    Constitutional: Alert and oriented. Well appearing and in no acute distress. Eyes: Conjunctivae are normal. PERRL. EOMI. Head: Atraumatic. Nose: No congestion/rhinnorhea. Mouth/Throat: Mucous membranes are Slightly dry.  Oropharynx non-erythematous. Neck: No stridor.   Cardiovascular: Tachycardic and irregular, heart rate from 120 to 150s during my exam. Grossly normal heart sounds.  Good peripheral circulation. Respiratory: Normal respiratory effort.  No retractions.  Lungs CTAB. Gastrointestinal: Soft and nontender. No distention.  No CVA tenderness. Musculoskeletal: No lower extremity tenderness nor edema.  No joint effusions. Neurologic:  Normal speech and language. No gross focal neurologic deficits are appreciated. 4 out of 5 strength in  the lower extremities bilateral, 5 out of 5 upper. Patient reports this is normal. She has to usually uses a cane to walk. Skin:  Skin is warm, dry and intact. No rash noted. Psychiatric: Mood and affect are normal. Speech and behavior are normal. She and family at the bedside are all very pleasant.  ____________________________________________   LABS (all labs ordered are listed, but only abnormal results are displayed)  Labs Reviewed  COMPREHENSIVE METABOLIC PANEL - Abnormal; Notable for the following:    Glucose, Bld 139 (*)    Calcium 8.8 (*)    GFR calc non Af Amer 58 (*)    All other components within normal limits  CBC - Abnormal; Notable for the following:    WBC 16.2 (*)    RDW 15.1 (*)    All other components within normal limits  PROTIME-INR - Abnormal; Notable for the following:    Prothrombin Time 16.2 (*)    All other components within normal limits  URINALYSIS COMPLETEWITH MICROSCOPIC ([facility] ONLY) - Abnormal; Notable for the following:    Color, Urine YELLOW (*)    APPearance CLEAR (*)    All other components within normal limits  LACTIC ACID, PLASMA  LIPASE, BLOOD  TROPONIN I  LACTIC ACID, PLASMA   ____________________________________________  EKG  Reviewed and interpreted by me at 11:05 AM Heart rate 140 QTc 400 QRS 70 Irregular, ventricular rate 140, nonspecific T-wave abnormality seen in multiple leads, no ST elevation though some suspicion for possible mild ischemic change in multiple lateral and inferior leads, may potentially also be rate related. ____________________________________________  RADIOLOGY     DG Chest 2 View (Final result) Result time: 01/23/16 00:21:54    Final result by Rad Results In Interface (01/23/16 00:21:54)   Narrative:   CLINICAL DATA: Chest pressure  EXAM: CHEST 2 VIEW  COMPARISON: 09/21/2014  FINDINGS: Chronic cardiomegaly. Stable positioning of dual-chamber pacer leads from the left. Negative aortic and hilar contours.  There is no edema, consolidation, effusion, or pneumothorax.  IMPRESSION: Stable. No evidence of acute disease.   Electronically Signed By: Monte Fantasia M.D. On: 01/23/2016 00:21          DG Abd 2 Views (Final result) Result time: 01/23/16 00:23:13   Final result by Rad Results In Interface (01/23/16 00:23:13)   Narrative:   CLINICAL DATA: Nausea. Constipation  EXAM: ABDOMEN - 2 VIEW  COMPARISON: None.  FINDINGS: No abnormal stool retention or obstruction. No pneumoperitoneum. No concerning intra-abdominal mass effect or calcification. Phleboliths noted over the pelvis and left flank. Lumbar levoscoliosis and previous proximal left femur repair.  IMPRESSION: No signs of obstruction or constipation.   Electronically Signed By: Monte Fantasia M.D. On: 01/23/2016 00:23          CT Head Wo Contrast (Final result) Result time: 01/22/16 23:57:36   Final result by Rad Results In Interface (01/22/16 23:57:36)   Narrative:   CLINICAL DATA: Acute onset of nausea and generalized weakness. Dry heaves. Initial encounter.  EXAM: CT HEAD WITHOUT CONTRAST  TECHNIQUE: Contiguous axial images were obtained from the base of the skull through the vertex without intravenous contrast.  COMPARISON: None.  FINDINGS: There is no evidence of acute infarction, mass lesion, or intra- or extra-axial hemorrhage on CT.  Prominence of the ventricles and sulci reflects mild cortical volume loss. Cerebellar atrophy is noted. Scattered periventricular and subcortical white matter change likely reflects small vessel ischemic microangiopathy. Chronic lacunar infarcts  are seen at the basal ganglia bilaterally.  The  brainstem and fourth ventricle are within normal limits. The basal ganglia are unremarkable in appearance. The cerebral hemispheres demonstrate grossly normal gray-white differentiation. No mass effect or midline shift is seen.  There is no evidence of fracture; visualized osseous structures are unremarkable in appearance. The orbits are within normal limits. The paranasal sinuses and mastoid air cells are well-aerated. No significant soft tissue abnormalities are seen.  IMPRESSION: 1. No acute intracranial pathology seen on CT. 2. Mild cortical volume loss and scattered small vessel ischemic microangiopathy. 3. Chronic lacunar infarcts at the basal ganglia bilaterally.   Electronically Signed By: Garald Balding M.D. On: 01/22/2016 23:57       ____________________________________________   PROCEDURES  Procedure(s) performed: None  Critical Care performed: No  ____________________________________________   INITIAL IMPRESSION / ASSESSMENT AND PLAN / ED COURSE  Pertinent labs & imaging results that were available during my care of the patient were reviewed by me and considered in my medical decision making (see chart for details).  Patient just for evaluation of generalized weakness today. She reports she feels like she noted some stress and anxiety due to her 89th birthday. She is overall well appearing, but is notably tachycardic. She is a production with a history of known A. fib, pacemaker, and tachybradycardia syndrome. She had a light chest pressure earlier, but this is now resolved and her troponin is negative.   ----------------------------------------- 3:09 AM on 01/23/2016 -----------------------------------------  Patient's heart rate, bounces about from regular rate to mild tachycardia. She is currently awake, asymptomatic and reports she feels good and wants to go home. I discussed results including a  white blood cell count is somewhat elevated, though I have no sign of infection. She reports all abdominal symptoms resolve, not have any chest pain or pressure. She is awake alert, and I discussed with both her and her son who are both extremely pleasant options including observation, further testing, versus discharge and close follow-up and return precautions. The patient and her son both indicate their very comfortable going home, I will give him a dose of oral metoprolol here prior to discharge for rate control. We discuss careful return precautions, and did offer admission to the hospital however with shared medical decision making I believe we have reached very reasonable conclusion to monitor her symptoms at home. She feels much better after light hydration, her vital signs are stable aside from occasional tachycardia which she has a history of.  CT reveals no obvious acute abnormality. Some small ischemic change and lacunar infarcts are noted, but no obvious or frank acute abnormality. She is oriented to anticoagulation and allergic to aspirin, and there is no evidence of acute neurologic deficit. CT was performed due to generalized weakness and being on anticoagulant to rule out intracranial hemorrhage which there is not.  She is an afebrile, no distress and well-appearing. We trial ambulation, she did well and reports with her normal gait which requires a cane. Discharged home. She'll follow up closely with her regular doctor. ____________________________________________   FINAL CLINICAL IMPRESSION(S) / ED DIAGNOSES  Final diagnoses:  General weakness  Atrial fibrillation with tachycardic ventricular rate (HCC)      Delman Kitten, MD 01/23/16 [phone]

## 2016-01-22 NOTE — ED Notes (Signed)
Per [facility], patient comes from home with c/o nausea and generalized weakness. Patient states, "I just feel sick". Patient has been dry heaving today. Hx of pace maker placement, parkinsons and a fib. EMS gave 4mg  of Zofran. Patient denies pain. A&O x4.

## 2016-01-23 DIAGNOSIS — R079 Chest pain, unspecified: Secondary | ICD-10-CM | POA: Diagnosis not present

## 2016-01-23 DIAGNOSIS — K5900 Constipation, unspecified: Secondary | ICD-10-CM | POA: Diagnosis not present

## 2016-01-23 DIAGNOSIS — I4891 Unspecified atrial fibrillation: Secondary | ICD-10-CM | POA: Diagnosis not present

## 2016-01-23 LAB — URINALYSIS COMPLETE WITH MICROSCOPIC (ARMC ONLY)
BACTERIA UA: NONE SEEN
BILIRUBIN URINE: NEGATIVE
GLUCOSE, UA: NEGATIVE mg/dL
Hgb urine dipstick: NEGATIVE
KETONES UR: NEGATIVE mg/dL
LEUKOCYTES UA: NEGATIVE
Nitrite: NEGATIVE
PROTEIN: NEGATIVE mg/dL
SPECIFIC GRAVITY, URINE: 1.012 (ref 1.005–1.030)
SQUAMOUS EPITHELIAL / LPF: NONE SEEN
pH: 7 (ref 5.0–8.0)

## 2016-01-23 LAB — COMPREHENSIVE METABOLIC PANEL
ALK PHOS: 53 U/L (ref 38–126)
ALT: 14 U/L (ref 14–54)
AST: 26 U/L (ref 15–41)
Albumin: 4.2 g/dL (ref 3.5–5.0)
Anion gap: 9 (ref 5–15)
BUN: 18 mg/dL (ref 6–20)
CALCIUM: 8.8 mg/dL — AB (ref 8.9–10.3)
CO2: 28 mmol/L (ref 22–32)
CREATININE: 0.86 mg/dL (ref 0.44–1.00)
Chloride: 101 mmol/L (ref 101–111)
GFR, EST NON AFRICAN AMERICAN: 58 mL/min — AB (ref >60)
Glucose, Bld: 139 mg/dL — ABNORMAL HIGH (ref 65–99)
Potassium: 3.8 mmol/L (ref 3.5–5.1)
SODIUM: 138 mmol/L (ref 135–145)
Total Bilirubin: 1.1 mg/dL (ref 0.3–1.2)
Total Protein: 6.8 g/dL (ref 6.5–8.1)

## 2016-01-23 MED ORDER — PREDNISONE 20 MG PO TABS: 40.0000 mg | ORAL_TABLET | Freq: Every day | ORAL | Status: DC

## 2016-01-23 MED ORDER — METOPROLOL TARTRATE 25 MG PO TABS
25.0000 mg | ORAL_TABLET | ORAL | Status: AC
  Administered 2016-01-23: 25 mg via ORAL
  Filled 2016-01-23: qty 1

## 2016-01-23 NOTE — ED Notes (Signed)
Pt discharged to home.  Family member driving.  Discharge instructions reviewed.  Verbalized understanding.  No questions or concerns at this time.  Teach back verified.  Pt in NAD.  No items left in ED.   

## 2016-01-23 NOTE — Discharge Instructions (Signed)
Atrial Fibrillation Atrial fibrillation is a type of irregular or rapid heartbeat (arrhythmia). In atrial fibrillation, the heart quivers continuously in a chaotic pattern. This occurs when parts of the heart receive disorganized signals that make the heart unable to pump blood normally. This can increase the risk for stroke, heart failure, and other heart-related conditions. There are different types of atrial fibrillation, including:  Paroxysmal atrial fibrillation. This type starts suddenly, and it usually stops on its own shortly after it starts.  Persistent atrial fibrillation. This type often lasts longer than a week. It may stop on its own or with treatment.  Long-lasting persistent atrial fibrillation. This type lasts longer than 12 months.  Permanent atrial fibrillation. This type does not go away. Talk with your health care provider to learn about the type of atrial fibrillation that you have. CAUSES This condition is caused by some heart-related conditions or procedures, including:  A heart attack.  Coronary artery disease.  Heart failure.  Heart valve conditions.  High blood pressure.  Inflammation of the sac that surrounds the heart (pericarditis).  Heart surgery.  Certain heart rhythm disorders, such as Wolf-Parkinson-White syndrome. Other causes include:  Pneumonia.  Obstructive sleep apnea.  Blockage of an artery in the lungs (pulmonary embolism, or PE).  Lung cancer.  Chronic lung disease.  Thyroid problems, especially if the thyroid is overactive (hyperthyroidism).  Caffeine.  Excessive alcohol use or illegal drug use.  Use of some medicines, including certain decongestants and diet pills. Sometimes, the cause cannot be found. RISK FACTORS This condition is more likely to develop in:  People who are older in age.  People who smoke.  People who have diabetes mellitus.  People who are overweight (obese).  Athletes who exercise  vigorously. SYMPTOMS Symptoms of this condition include:  A feeling that your heart is beating rapidly or irregularly.  A feeling of discomfort or pain in your chest.  Shortness of breath.  Sudden light-headedness or weakness.  Getting tired easily during exercise. In some cases, there are no symptoms. DIAGNOSIS Your health care provider may be able to detect atrial fibrillation when taking your pulse. If detected, this condition may be diagnosed with:  An electrocardiogram (ECG).  A Holter monitor test that records your heartbeat patterns over a 24-hour period.  Transthoracic echocardiogram (TTE) to evaluate how blood flows through your heart.  Transesophageal echocardiogram (TEE) to view more detailed images of your heart.  A stress test.  Imaging tests, such as a CT scan or chest X-ray.  Blood tests. TREATMENT The main goals of treatment are to prevent blood clots from forming and to keep your heart beating at a normal rate and rhythm. The type of treatment that you receive depends on many factors, such as your underlying medical conditions and how you feel when you are experiencing atrial fibrillation. This condition may be treated with:  Medicine to slow down the heart rate, bring the heart's rhythm back to normal, or prevent clots from forming.  Electrical cardioversion. This is a procedure that resets your heart's rhythm by delivering a controlled, low-energy shock to the heart through your skin.  Different types of ablation, such as catheter ablation, catheter ablation with pacemaker, or surgical ablation. These procedures destroy the heart tissues that send abnormal signals. When the pacemaker is used, it is placed under your skin to help your heart beat in a regular rhythm. HOME CARE INSTRUCTIONS  Take over-the counter and prescription medicines only as told by your health care provider.  If your health care provider prescribed a blood-thinning medicine  (anticoagulant), take it exactly as told. Taking too much blood-thinning medicine can cause bleeding. If you do not take enough blood-thinning medicine, you will not have the protection that you need against stroke and other problems.  Do not use tobacco products, including cigarettes, chewing tobacco, and e-cigarettes. If you need help quitting, ask your health care provider.  If you have obstructive sleep apnea, manage your condition as told by your health care provider.  Do not drink alcohol.  Do not drink beverages that contain caffeine, such as coffee, soda, and tea.  Maintain a healthy weight. Do not use diet pills unless your health care provider approves. Diet pills may make heart problems worse.  Follow diet instructions as told by your health care provider.  Exercise regularly as told by your health care provider.  Keep all follow-up visits as told by your health care provider. This is important. PREVENTION  Avoid drinking beverages that contain caffeine or alcohol.  Avoid certain medicines, especially medicines that are used for breathing problems.  Avoid certain herbs and herbal medicines, such as those that contain ephedra or ginseng.  Do not use illegal drugs, such as cocaine and amphetamines.  Do not smoke.  Manage your high blood pressure. SEEK MEDICAL CARE IF:  You notice a change in the rate, rhythm, or strength of your heartbeat.  You are taking an anticoagulant and you notice increased bruising.  You tire more easily when you exercise or exert yourself. SEEK IMMEDIATE MEDICAL CARE IF:  You have chest pain, abdominal pain, sweating, or weakness.  You feel nauseous.  You notice blood in your vomit, bowel movement, or urine.  You have shortness of breath.  You suddenly have swollen feet and ankles.  You feel dizzy.  You have sudden weakness or numbness of the face, arm, or leg, especially on one side of the body.  You have trouble speaking,  trouble understanding, or both (aphasia).  Your face or your eyelid droops on one side. These symptoms may represent a serious problem that is an emergency. Do not wait to see if the symptoms will go away. Get medical help right away. Call your local emergency services (911 in the [place] Do not drive yourself to the hospital.   This information is not intended to replace advice given to you by your health care provider. Make sure you discuss any questions you have with your health care provider.   Document Released: 08/08/2005 Document Revised: 04/29/2015 Document Reviewed: 12/03/2014 Elsevier Interactive Patient Education 2016 Elsevier Inc.  Weakness Weakness is a lack of strength. It may be felt all over the body (generalized) or in one specific part of the body (focal). Some causes of weakness can be serious. You may need further medical evaluation, especially if you are elderly or you have a history of immunosuppression (such as chemotherapy or HIV), kidney disease, heart disease, or diabetes. CAUSES  Weakness can be caused by many different things, including:  Infection.  Physical exhaustion.  Internal bleeding or other blood loss that results in a lack of red blood cells (anemia).  Dehydration. This cause is more common in elderly people.  Side effects or electrolyte abnormalities from medicines, such as pain medicines or sedatives.  Emotional distress, anxiety, or depression.  Circulation problems, especially severe peripheral arterial disease.  Heart disease, such as rapid atrial fibrillation, bradycardia, or heart failure.  Nervous system disorders, such as Guillain-Barr syndrome, multiple sclerosis, or stroke. DIAGNOSIS  To find the cause of your weakness, your caregiver will take your history and perform a physical exam. Lab tests or X-rays may also be ordered, if needed. TREATMENT  Treatment of weakness depends on the cause of your symptoms and can vary greatly. HOME  CARE INSTRUCTIONS   Rest as needed.  Eat a well-balanced diet.  Try to get some exercise every day.  Only take over-the-counter or prescription medicines as directed by your caregiver. SEEK MEDICAL CARE IF:   Your weakness seems to be getting worse or spreads to other parts of your body.  You develop new aches or pains. SEEK IMMEDIATE MEDICAL CARE IF:   You cannot perform your normal daily activities, such as getting dressed and feeding yourself.  You cannot walk up and down stairs, or you feel exhausted when you do so.  You have shortness of breath or chest pain.  You have difficulty moving parts of your body.  You have weakness in only one area of the body or on only one side of the body.  You have a fever.  You have trouble speaking or swallowing.  You cannot control your bladder or bowel movements.  You have black or bloody vomit or stools. MAKE SURE YOU:  Understand these instructions.  Will watch your condition.  Will get help right away if you are not doing well or get worse.   This information is not intended to replace advice given to you by your health care provider. Make sure you discuss any questions you have with your health care provider.   Document Released: 08/08/2005 Document Revised: 02/07/2012 Document Reviewed: 10/07/2011 Elsevier Interactive Patient Education [employer].

## 2016-01-25 ENCOUNTER — Ambulatory Visit (INDEPENDENT_AMBULATORY_CARE_PROVIDER_SITE_OTHER): Payer: Medicare Other | Admitting: *Deleted

## 2016-01-25 DIAGNOSIS — I495 Sick sinus syndrome: Secondary | ICD-10-CM

## 2016-01-25 LAB — CUP PACEART REMOTE DEVICE CHECK
Battery Impedance: 546 Ohm
Battery Voltage: 2.79 V
Brady Statistic AP VP Percent: 0 %
Brady Statistic AS VP Percent: 0 %
Implantable Lead Implant Date: 20111108
Implantable Lead Location: 753859
Implantable Lead Model: 5076
Implantable Lead Model: 5076
Lead Channel Impedance Value: 521 Ohm
Lead Channel Pacing Threshold Amplitude: 0.625 V
Lead Channel Pacing Threshold Pulse Width: 0.4 ms
Lead Channel Sensing Intrinsic Amplitude: 1 mV
Lead Channel Sensing Intrinsic Amplitude: 8 mV
MDC IDC LEAD IMPLANT DT: 20111108
MDC IDC LEAD LOCATION: 753860
MDC IDC MSMT BATTERY REMAINING LONGEVITY: 78 mo
MDC IDC MSMT LEADCHNL RA IMPEDANCE VALUE: 499 Ohm
MDC IDC MSMT LEADCHNL RV PACING THRESHOLD AMPLITUDE: 0.875 V
MDC IDC MSMT LEADCHNL RV PACING THRESHOLD PULSEWIDTH: 0.4 ms
MDC IDC SESS DTM: 20170604215033
MDC IDC SET LEADCHNL RA PACING AMPLITUDE: 2 V
MDC IDC SET LEADCHNL RV PACING AMPLITUDE: 2.5 V
MDC IDC SET LEADCHNL RV PACING PULSEWIDTH: 0.4 ms
MDC IDC SET LEADCHNL RV SENSING SENSITIVITY: 4 mV
MDC IDC STAT BRADY AP VS PERCENT: 0 %
MDC IDC STAT BRADY AS VS PERCENT: 99 %

## 2016-02-17 ENCOUNTER — Telehealth: Payer: Self-pay | Admitting: *Deleted

## 2016-02-17 NOTE — Telephone Encounter (Signed)
Patient's daughter called regarding her mother. She recently got out of the ED at [facility] with dehydration and afib. Please call daughter as she is worried about her mother. She is symtom free as of today??? (daughter was difficult to get information from on the phone). She does have a device clinic letter in her chart.

## 2016-02-18 ENCOUNTER — Encounter: Payer: Self-pay | Admitting: Cardiology

## 2016-02-18 NOTE — Progress Notes (Signed)
Remote pacemaker transmission.   

## 2016-03-02 ENCOUNTER — Ambulatory Visit (INDEPENDENT_AMBULATORY_CARE_PROVIDER_SITE_OTHER): Payer: Medicare Other | Admitting: Family Medicine

## 2016-03-02 ENCOUNTER — Encounter: Payer: Self-pay | Admitting: Family Medicine

## 2016-03-02 VITALS — BP 130/82 | HR 69 | Temp 98.5°F | Resp 16 | Wt 152.0 lb

## 2016-03-02 DIAGNOSIS — N3000 Acute cystitis without hematuria: Secondary | ICD-10-CM | POA: Diagnosis not present

## 2016-03-02 DIAGNOSIS — R300 Dysuria: Secondary | ICD-10-CM

## 2016-03-02 MED ORDER — CIPROFLOXACIN HCL 500 MG PO TABS: 500.0000 mg | ORAL_TABLET | Freq: Two times a day (BID) | ORAL | Status: AC

## 2016-03-02 NOTE — Progress Notes (Signed)
Patient: Whitney Hoffman Female    DOB: 02-27-1924   82 y.o.   MRN: [id number] Visit Date: 03/02/2016  Today's Provider: Lelon Huh, MD   Chief Complaint  Patient presents with  . Urinary Tract Infection   Subjective:    Urinary Tract Infection  This is a new problem. Episode onset: 3 weeks ago. The problem occurs every urination. The problem has been gradually worsening. The quality of the pain is described as burning (stinging ). There has been no fever. Associated symptoms include urgency. Pertinent negatives include no chills, discharge, flank pain, hematuria, hesitancy, nausea, sweats or vomiting. Treatments tried: took 1 AZO pill. The treatment provided no relief.  Patient states she used OTC test strips to check for UTI's and it showed that she may possibly have an infection.      Allergies  Allergen Reactions  . Aspirin     Sick on stomach  . Codeine     Feel dizzy  . Diazepam     Sick on stomach  . Meperidine Hcl     Sick on stomach   . Morphine     Nausea/sick on stomach  . Propoxyphene    Current Meds  Medication Sig  . acetaminophen (TYLENOL 8 HOUR) 650 MG CR tablet Take 650 mg by mouth as needed.    . Cholecalciferol (HM VITAMIN D3) 4000 UNITS CAPS Take 2,000 Units by mouth daily.   . Cyanocobalamin (B-12 PO) Take by mouth daily.  . dabigatran (PRADAXA) 75 MG CAPS capsule Take 1 capsule (75 mg total) by mouth 2 (two) times daily.  . furosemide (LASIX) 20 MG tablet Take one 20 mg tablet three times a week as needed  . losartan (COZAAR) 25 MG tablet Take 1 tablet (25 mg total) by mouth daily.  Marland Kitchen lovastatin (MEVACOR) 20 MG tablet Take 1 tablet (20 mg total) by mouth at bedtime.  . meclizine (ANTIVERT) 25 MG tablet Take 25 mg by mouth 3 (three) times daily as needed.    . metoprolol (LOPRESSOR) 50 MG tablet Take 1 tablet (50 mg total) by mouth 2 (two) times daily.  Marland Kitchen oxybutynin (DITROPAN-XL) 5 MG 24 hr tablet Take 1 tablet (5 mg total) by mouth daily.    . raloxifene (EVISTA) 60 MG tablet TAKE 1 TABLET BY MOUTH ONCE DAILY    Review of Systems  Constitutional: Negative for fever, chills, diaphoresis, appetite change and fatigue.  Respiratory: Negative for chest tightness and shortness of breath.   Cardiovascular: Negative for chest pain and palpitations.  Gastrointestinal: Negative for nausea, vomiting and abdominal pain.  Genitourinary: Positive for dysuria and urgency. Negative for hesitancy, hematuria and flank pain.  Musculoskeletal: Positive for back pain.  Neurological: Negative for dizziness and weakness.  Psychiatric/Behavioral: Negative for confusion.    Social History  Substance Use Topics  . Smoking status: Never Smoker   . Smokeless tobacco: Not on file     Comment: tobacco use- no   . Alcohol Use: No   Objective:   Temp(Src) 98.5 F (36.9 C) (Oral)  Resp 16  Wt 152 lb (68.947 kg)  Physical Exam  General Appearance:    Alert, cooperative, no distress  Eyes:    PERRL, conjunctiva/corneas clear, EOM's intact       Lungs:     Clear to auscultation bilaterally, respirations unlabored  Heart:    Regular rate and rhythm  Abdomen:   No CVA tenderness  Assessment & Plan:     1. Dysuria  - Urine culture  2. Acute cystitis without hematuria cipro 500mg  bid x 7 days - Urine culture       Lelon Huh, MD  [facility]

## 2016-03-03 LAB — URINE CULTURE: Organism ID, Bacteria: NO GROWTH

## 2016-04-01 ENCOUNTER — Other Ambulatory Visit: Payer: Self-pay

## 2016-04-01 MED ORDER — DABIGATRAN ETEXILATE MESYLATE 75 MG PO CAPS: 75.0000 mg | ORAL_CAPSULE | Freq: Two times a day (BID) | ORAL | 3 refills | Status: DC

## 2016-04-26 ENCOUNTER — Encounter: Payer: Self-pay | Admitting: Internal Medicine

## 2016-04-26 ENCOUNTER — Ambulatory Visit (INDEPENDENT_AMBULATORY_CARE_PROVIDER_SITE_OTHER): Payer: Medicare Other | Admitting: Internal Medicine

## 2016-04-26 VITALS — BP 138/64 | HR 104 | Ht 63.0 in | Wt 151.8 lb

## 2016-04-26 DIAGNOSIS — I495 Sick sinus syndrome: Secondary | ICD-10-CM | POA: Diagnosis not present

## 2016-04-26 DIAGNOSIS — I5030 Unspecified diastolic (congestive) heart failure: Secondary | ICD-10-CM

## 2016-04-26 DIAGNOSIS — Z950 Presence of cardiac pacemaker: Secondary | ICD-10-CM

## 2016-04-26 DIAGNOSIS — I481 Persistent atrial fibrillation: Secondary | ICD-10-CM | POA: Diagnosis not present

## 2016-04-26 DIAGNOSIS — I4819 Other persistent atrial fibrillation: Secondary | ICD-10-CM

## 2016-04-26 MED ORDER — APIXABAN 5 MG PO TABS: 5.0000 mg | ORAL_TABLET | Freq: Two times a day (BID) | ORAL | 11 refills | Status: DC

## 2016-04-26 MED ORDER — METOPROLOL TARTRATE 50 MG PO TABS: ORAL_TABLET | ORAL | Status: DC

## 2016-04-26 NOTE — Progress Notes (Signed)
Patient Care Team: Birdie Sons, MD as PCP - General (Family Medicine) Birdie Sons, MD as Referring Physician (Family Medicine) Deboraha Sprang, MD as Consulting Physician (Cardiology) Vladimir Crofts, MD (Neurology)   HPI  Whitney Hoffman is a 82 y.o. female Seen in followup for tachybradycardia syndrome with prior pacemaker insertion. Persistent AFib on dabigitran   She was seen in [facility] ER 6/17 with AFib and RVR  She has significant complaints of exercise intolerance. This is intermittent.  No recent problems with edema  Her other major complaint is GI distress on most mornings characterized by pain and nausea. She is on dabigitran. .  8/16 Rates remained rapid and BB increased and lisinopril decreased   We have no available assessment of LV function iN EPIC or CareEverywhere       Past Medical History:  Diagnosis Date  . Atrial fibrillation ([place])   . Parkinson disease ([place])   . Tachy-brady syndrome ([place])    with medtronic dual chambe rPCM    Past Surgical History:  Procedure Laterality Date  . ABDOMINAL HYSTERECTOMY  1966   fibroids ovaries removed secondary to ovarian cyst  . APPENDECTOMY  1944  . BREAST BIOPSY  2010   left benign repeat ultrasound recommneded in 2011 [place] [place]    . CHOLECYSTECTOMY  1969  . GALLBLADDER SURGERY    . HIP SURGERY Left    ORIF x 2; post op infection Left hip  . PACEMAKER PLACEMENT  06/29/2010  . WRIST SURGERY  1996   Traumatic B    Current Outpatient Prescriptions  Medication Sig Dispense Refill  . acetaminophen (TYLENOL 8 HOUR) 650 MG CR tablet Take 650 mg by mouth as needed.      . Cholecalciferol (HM VITAMIN D3) 4000 UNITS CAPS Take 2,000 Units by mouth daily.     . Cyanocobalamin (B-12 PO) Take by mouth daily.    . dabigatran (PRADAXA) 75 MG CAPS capsule Take 1 capsule (75 mg total) by mouth 2 (two) times daily. 60 capsule 3  . furosemide (LASIX) 20 MG tablet Take one 20 mg  tablet three times a week as needed 30 tablet 6  . losartan (COZAAR) 25 MG tablet Take 1 tablet (25 mg total) by mouth daily. 30 tablet 11  . lovastatin (MEVACOR) 20 MG tablet Take 1 tablet (20 mg total) by mouth at bedtime. 30 tablet 12  . meclizine (ANTIVERT) 25 MG tablet Take 25 mg by mouth 3 (three) times daily as needed.      . metoprolol (LOPRESSOR) 50 MG tablet Take 1 tablet (50 mg total) by mouth 2 (two) times daily. 60 tablet 11  . oxybutynin (DITROPAN-XL) 5 MG 24 hr tablet Take 1 tablet (5 mg total) by mouth daily. 30 tablet 12  . raloxifene (EVISTA) 60 MG tablet TAKE 1 TABLET BY MOUTH ONCE DAILY 30 tablet 5   No current facility-administered medications for this visit.     Allergies  Allergen Reactions  . Aspirin     Sick on stomach  . Codeine     Feel dizzy  . Diazepam     Sick on stomach  . Meperidine Hcl     Sick on stomach   . Morphine     Nausea/sick on stomach  . Propoxyphene     Review of Systems negative except from HPI and PMH  Physical Exam BP 138/64 (BP Location: Left Arm, Patient Position: Sitting, Cuff Size:  Normal)   Pulse (!) 104   Ht 5\' 3"  (1.6 m)   Wt 151 lb 12.8 oz (68.9 kg)   BMI 26.89 kg/m  Well developed and nourished in no acute distress HENT normal Neck supple   Device pocket well healed; without hematoma or erythema.  There is no tethering  Clear Regular rate and rhythm, no murmurs or gallops Abd-soft with active BS No Clubbing cyanosis edema Skin-warm and dry A & Oriented  Grossly normal sensory and motor function with tremor   ECG Atrial fibrillation at a rate of 104 Intervals-/07/39 Axis -53  Assessment and  Plan  HFpEF  Permanent atrial fib  Pacemaker Medtronic The patient's device was interrogated.  The information was reviewed. Device was reprogrammed [id number]  Orthostatic intolerance-a.m  GI pain and nausea.  Euvolemic continue current meds  She has persistent atrial fibrillation with relatively rapid rate.  Heart rates remain somewhat rapid. Blood pressure stable We will discontinue the losartan increase the metoprolol 50/50--100/50. We will have to be attentive and I have reviewed this with the family as to the issue will a.m. orthostatic intolerance previously a problem. Currently it is not an issue.  Her GI distress may be related to dabigitran; we will undertake a exclusion trial using ELIQUIS in its place. For age alone would dictate a lower dose, weight and renal function dictate 5 mg twice daily    Blood pressure stable as noted.

## 2016-04-26 NOTE — Patient Instructions (Signed)
Medication Instructions: - Your physician has recommended you make the following change in your medication:  1) Stop Pradaxa 2) Start Eliquis 5 mg one tablet by mouth twice daily 3) Stop Losartan (cozaar) 4) Increase Metoprolol to 100 mg in the morning and 50 mg in the evening  Labwork: - none  Procedures/Testing: - none  Follow-Up: - Remote monitoring is used to monitor your Pacemaker of ICD from home. This monitoring reduces the number of office visits required to check your device to one time per year. It allows [place] to keep an eye on the functioning of your device to ensure it is working properly. You are scheduled for a device check from home on 07/26/16. You may send your transmission at any time that day. If you have a wireless device, the transmission will be sent automatically. After your physician reviews your transmission, you will receive a postcard with your next transmission date.  - Your physician wants you to follow-up in: 6 months with Dr. Caryl Comes. You will receive a reminder letter in the mail two months in advance. If you don't receive a letter, please call our office to schedule the follow-up appointment.  Any Additional Special Instructions Will Be Listed Below (If Applicable).     If you need a refill on your cardiac medications before your next appointment, please call your pharmacy.

## 2016-04-28 ENCOUNTER — Other Ambulatory Visit: Payer: Self-pay | Admitting: *Deleted

## 2016-04-28 MED ORDER — METOPROLOL TARTRATE 50 MG PO TABS: ORAL_TABLET | ORAL | 3 refills | Status: DC

## 2016-05-05 LAB — CUP PACEART INCLINIC DEVICE CHECK
Brady Statistic AP VS Percent: 0.1 %
Brady Statistic AS VS Percent: 99.3 %
Implantable Lead Implant Date: 20111108
Implantable Lead Location: 753859
Implantable Lead Model: 5076
Lead Channel Setting Pacing Amplitude: 2.5 V
MDC IDC LEAD IMPLANT DT: 20111108
MDC IDC LEAD LOCATION: 753860
MDC IDC SESS DTM: 20170914114943
MDC IDC SET LEADCHNL RV PACING PULSEWIDTH: 0.4 ms
MDC IDC SET LEADCHNL RV SENSING SENSITIVITY: 4 mV
MDC IDC STAT BRADY AP VP PERCENT: 0.4 %
MDC IDC STAT BRADY AS VP PERCENT: 0.3 %

## 2016-05-10 DIAGNOSIS — L821 Other seborrheic keratosis: Secondary | ICD-10-CM | POA: Diagnosis not present

## 2016-05-10 DIAGNOSIS — D235 Other benign neoplasm of skin of trunk: Secondary | ICD-10-CM | POA: Diagnosis not present

## 2016-05-10 DIAGNOSIS — Z08 Encounter for follow-up examination after completed treatment for malignant neoplasm: Secondary | ICD-10-CM | POA: Diagnosis not present

## 2016-05-10 DIAGNOSIS — Z85828 Personal history of other malignant neoplasm of skin: Secondary | ICD-10-CM | POA: Diagnosis not present

## 2016-05-19 ENCOUNTER — Encounter: Payer: Self-pay | Admitting: Internal Medicine

## 2016-06-21 DIAGNOSIS — G3184 Mild cognitive impairment, so stated: Secondary | ICD-10-CM | POA: Diagnosis not present

## 2016-06-21 DIAGNOSIS — G20 Parkinson's disease: Secondary | ICD-10-CM | POA: Diagnosis not present

## 2016-07-26 ENCOUNTER — Ambulatory Visit: Payer: Medicare Other | Admitting: *Deleted

## 2016-07-26 ENCOUNTER — Telehealth: Payer: Self-pay | Admitting: Cardiology

## 2016-07-26 NOTE — Telephone Encounter (Signed)
Spoke with pt and reminded pt of remote transmission that is due today. Pt verbalized understanding and said she will send her transmission on Wednesday 07-27-16.

## 2016-07-28 ENCOUNTER — Ambulatory Visit (INDEPENDENT_AMBULATORY_CARE_PROVIDER_SITE_OTHER): Payer: Medicare Other | Admitting: *Deleted

## 2016-07-28 DIAGNOSIS — I495 Sick sinus syndrome: Secondary | ICD-10-CM

## 2016-08-08 ENCOUNTER — Telehealth: Payer: Self-pay | Admitting: Internal Medicine

## 2016-08-08 NOTE — Telephone Encounter (Signed)
Pt c/o medication issue:  1. Name of Medication: Eliquis   2. How are you currently taking this medication (dosage and times per day)? Pt daughter calling for patient and she is not sure.   3. Are you having a reaction (difficulty breathing--STAT)? No   4. What is your medication issue? Having nose bleeds

## 2016-08-09 NOTE — Progress Notes (Signed)
Remote pacemaker transmission.   

## 2016-08-09 NOTE — Telephone Encounter (Signed)
I called and spoke with Janie. She states the patient has had intermittent nose bleeds (about 5) over the last few weeks. This takes about 30 min-1 hour to stop.  She is having bouts of constipation and diarrhea.  This was going on prior to the initiation of Eliquis in early September.  She was previously on Pradaxa and this was changed due to GI distress. Per Narda Rutherford, the patient is more concerned about her nosebleeds at this time. She wants to come off eliquis and go back on pradaxa. I advised Narda Rutherford I will review with Dr. Caryl Comes and call her back . She is agreeable.

## 2016-08-10 ENCOUNTER — Encounter: Payer: Self-pay | Admitting: Cardiology

## 2016-08-10 NOTE — Telephone Encounter (Signed)
We can try Rivaroxaban also if she is willing She shold also think about seeing an ENT

## 2016-08-11 MED ORDER — DABIGATRAN ETEXILATE MESYLATE 75 MG PO CAPS: 75.0000 mg | ORAL_CAPSULE | Freq: Two times a day (BID) | ORAL | 11 refills | Status: DC

## 2016-08-11 NOTE — Telephone Encounter (Signed)
Pt daughter is calling to check status of previous phone call. Please call.

## 2016-08-11 NOTE — Telephone Encounter (Signed)
Reviewed again with Dr. Caryl Comes- I advised the patient would like to retry pradaxa. He is agreeable with this. I have spoken with Narda Rutherford and made her aware- she is requesting a RX be sent in for this as the patient only has about a weeks worth of her previous RX left.  The patient was previously on 75 mg BID of Pradaxa.

## 2016-08-24 LAB — CUP PACEART REMOTE DEVICE CHECK
Implantable Lead Implant Date: 20111108
Implantable Lead Model: 5076
Implantable Pulse Generator Implant Date: 20111108
Lead Channel Impedance Value: 593 Ohm
Lead Channel Impedance Value: 67 Ohm
Lead Channel Pacing Threshold Pulse Width: 0.4 ms
Lead Channel Setting Sensing Sensitivity: 5.6 mV
MDC IDC LEAD IMPLANT DT: 20111108
MDC IDC LEAD LOCATION: 753859
MDC IDC LEAD LOCATION: 753860
MDC IDC MSMT BATTERY IMPEDANCE: 647 Ohm
MDC IDC MSMT BATTERY REMAINING LONGEVITY: 83 mo
MDC IDC MSMT BATTERY VOLTAGE: 2.78 V
MDC IDC MSMT LEADCHNL RV PACING THRESHOLD AMPLITUDE: 0.875 V
MDC IDC SESS DTM: 20171208022232
MDC IDC SET LEADCHNL RV PACING AMPLITUDE: 2.5 V
MDC IDC SET LEADCHNL RV PACING PULSEWIDTH: 0.4 ms
MDC IDC STAT BRADY RV PERCENT PACED: 16 %

## 2016-09-02 ENCOUNTER — Other Ambulatory Visit: Payer: Self-pay | Admitting: *Deleted

## 2016-09-02 ENCOUNTER — Other Ambulatory Visit: Payer: Self-pay | Admitting: Family Medicine

## 2016-09-02 MED ORDER — METOPROLOL TARTRATE 50 MG PO TABS: ORAL_TABLET | ORAL | 3 refills | Status: DC

## 2016-09-03 ENCOUNTER — Other Ambulatory Visit: Payer: Self-pay | Admitting: Family Medicine

## 2016-09-15 ENCOUNTER — Telehealth: Payer: Self-pay

## 2016-09-15 NOTE — Telephone Encounter (Signed)
Whitney Hoffman calling, daughter of [name] she reports that the patient has Nausea, constipation, Muscle weakness. She wants Dr. Caryn Section to take her off the Lovastatin because she feels this is what is causing her symptoms and if he can prescribe something different. I offered an appointment and declined.She wants to know what Dr.Fisher advises first. Please advise.  Thanks,  -Avnoor Koury

## 2016-09-15 NOTE — Telephone Encounter (Signed)
Whitney Hoffman was notified. Expressed understanding.

## 2016-09-15 NOTE — Telephone Encounter (Signed)
Please advise 

## 2016-09-15 NOTE — Telephone Encounter (Signed)
Can put lovastatin on hold for now and schedule follow up in 2-3 weeks to see if she is doing better or if needs to change medications.

## 2016-10-20 ENCOUNTER — Telehealth: Payer: Self-pay | Admitting: Family Medicine

## 2016-10-25 ENCOUNTER — Encounter: Payer: Medicare Other | Admitting: Internal Medicine

## 2016-10-28 ENCOUNTER — Other Ambulatory Visit: Payer: Self-pay | Admitting: Family Medicine

## 2016-10-29 ENCOUNTER — Telehealth: Payer: Self-pay | Admitting: Cardiology

## 2016-10-29 NOTE — Telephone Encounter (Signed)
   Was notified by pt's pharmacy that her Pradaxa is no longer preferred. Unable to afford rx. She needs a/c for PAF. She was unable to tolerate Eliquis in the past. I've discussed with Dr. Caryl Comes, who is on call this weekend. He is ok changing her to Xarelto, 20 mg nightly.   Whitney Hoffman Rosita Fire 10/29/2016

## 2016-11-09 DIAGNOSIS — G20 Parkinson's disease: Secondary | ICD-10-CM | POA: Diagnosis not present

## 2016-11-12 ENCOUNTER — Other Ambulatory Visit: Payer: Self-pay | Admitting: Family Medicine

## 2016-11-22 ENCOUNTER — Ambulatory Visit (INDEPENDENT_AMBULATORY_CARE_PROVIDER_SITE_OTHER): Payer: Medicare Other | Admitting: Internal Medicine

## 2016-11-22 ENCOUNTER — Encounter: Payer: Self-pay | Admitting: Internal Medicine

## 2016-11-22 VITALS — BP 138/82 | HR 75 | Ht 61.0 in | Wt 145.0 lb

## 2016-11-22 DIAGNOSIS — I495 Sick sinus syndrome: Secondary | ICD-10-CM | POA: Diagnosis not present

## 2016-11-22 DIAGNOSIS — Z950 Presence of cardiac pacemaker: Secondary | ICD-10-CM | POA: Diagnosis not present

## 2016-11-22 DIAGNOSIS — I4821 Permanent atrial fibrillation: Secondary | ICD-10-CM

## 2016-11-22 DIAGNOSIS — I482 Chronic atrial fibrillation: Secondary | ICD-10-CM

## 2016-11-22 LAB — CUP PACEART INCLINIC DEVICE CHECK
Battery Impedance: 750 Ohm
Implantable Lead Implant Date: 20111108
Implantable Lead Location: 753860
Implantable Lead Model: 5076
Implantable Pulse Generator Implant Date: 20111108
Lead Channel Impedance Value: 577 Ohm
Lead Channel Impedance Value: 67 Ohm
Lead Channel Pacing Threshold Pulse Width: 0.4 ms
Lead Channel Sensing Intrinsic Amplitude: 15.67 mV
Lead Channel Setting Pacing Pulse Width: 0.4 ms
MDC IDC LEAD IMPLANT DT: 20111108
MDC IDC LEAD LOCATION: 753859
MDC IDC MSMT BATTERY REMAINING LONGEVITY: 77 mo
MDC IDC MSMT BATTERY VOLTAGE: 2.78 V
MDC IDC MSMT LEADCHNL RV PACING THRESHOLD AMPLITUDE: 0.75 V
MDC IDC SESS DTM: 20180403154547
MDC IDC SET LEADCHNL RV PACING AMPLITUDE: 2.5 V
MDC IDC SET LEADCHNL RV SENSING SENSITIVITY: 5.6 mV
MDC IDC STAT BRADY RV PERCENT PACED: 16 %

## 2016-11-22 MED ORDER — RIVAROXABAN 15 MG PO TABS: 15.0000 mg | ORAL_TABLET | Freq: Two times a day (BID) | ORAL | Status: DC

## 2016-11-22 NOTE — Patient Instructions (Signed)
Medication Instructions: - Your physician has recommended you make the following change in your medication: 1) increase Xarelto to 15 mg- take one tablet by mouth once daily  Labwork: - none ordered  Procedures/Testing: - none ordered  Follow-Up: - Remote monitoring is used to monitor your Pacemaker of ICD from home. This monitoring reduces the number of office visits required to check your device to one time per year. It allows [place] to keep an eye on the functioning of your device to ensure it is working properly. You are scheduled for a device check from home on 02/21/17. You may send your transmission at any time that day. If you have a wireless device, the transmission will be sent automatically. After your physician reviews your transmission, you will receive a postcard with your next transmission date.  - Your physician wants you to follow-up in: 1 year with Dr. Caryl Comes. You will receive a reminder letter in the mail two months in advance. If you don't receive a letter, please call our office to schedule the follow-up appointment.  Any Additional Special Instructions Will Be Listed Below (If Applicable).     If you need a refill on your cardiac medications before your next appointment, please call your pharmacy.

## 2016-11-22 NOTE — Progress Notes (Signed)
Patient Care Team: Birdie Sons, MD as PCP - General (Family Medicine) Birdie Sons, MD as Referring Physician (Family Medicine) Deboraha Sprang, MD as Consulting Physician (Cardiology) Vladimir Crofts, MD (Neurology)   HPI  Whitney Hoffman is a 83 y.o. female Seen in followup for tachybradycardia syndrome with prior pacemaker insertion. Persistent AFib on dabigitran   She has significant complaints of   exercise intolerance. She is able with improved rate control to walk a little bit further.  She ended up on Rivaroxaban at 10 mg a day?? and has had no bleeding issues.   We have no available assessment of LV function in EPIC or CareEverywhere       Past Medical History:  Diagnosis Date  . Atrial fibrillation ([place])   . Parkinson disease ([place])   . Tachy-brady syndrome ([place])    with medtronic dual chambe rPCM    Past Surgical History:  Procedure Laterality Date  . ABDOMINAL HYSTERECTOMY  1966   fibroids ovaries removed secondary to ovarian cyst  . APPENDECTOMY  1944  . BREAST BIOPSY  2010   left benign repeat ultrasound recommneded in 2011 [facility] [place]    . CHOLECYSTECTOMY  1969  . GALLBLADDER SURGERY    . HIP SURGERY Left    ORIF x 2; post op infection Left hip  . PACEMAKER PLACEMENT  06/29/2010  . WRIST SURGERY  1996   Traumatic B    Current Outpatient Prescriptions  Medication Sig Dispense Refill  . acetaminophen (TYLENOL 8 HOUR) 650 MG CR tablet Take 650 mg by mouth as needed.      . Cholecalciferol (HM VITAMIN D3) 4000 UNITS CAPS Take 2,000 Units by mouth daily.     . Cyanocobalamin (B-12 PO) Take by mouth daily.    Marland Kitchen lovastatin (MEVACOR) 20 MG tablet TAKE 1 TABLET BY MOUTH EVERY NIGHT AT BEDTIME 30 tablet 12  . meclizine (ANTIVERT) 25 MG tablet Take 25 mg by mouth 3 (three) times daily as needed.      . metoprolol (LOPRESSOR) 50 MG tablet Take two tablets (100 mg) by mouth every morning and one tablet (50 mg) by mouth  every evening 90 tablet 3  . oxybutynin (DITROPAN-XL) 5 MG 24 hr tablet TAKE 1 TABLET BY MOUTH ONCE DAILY. 30 tablet 12  . pyridoxine (B-6) 200 MG tablet Take 200 mg by mouth daily.    . rivaroxaban (XARELTO) 10 MG TABS tablet Take 10 mg by mouth daily.    . dabigatran (PRADAXA) 75 MG CAPS capsule Take 1 capsule (75 mg total) by mouth 2 (two) times daily. (Patient not taking: Reported on 11/22/2016) 60 capsule 11  . furosemide (LASIX) 20 MG tablet Take one 20 mg tablet three times a week as needed (Patient not taking: Reported on 11/22/2016) 30 tablet 6  . raloxifene (EVISTA) 60 MG tablet TAKE 1 TABLET BY MOUTH ONCE DAILY (Patient not taking: Reported on 11/22/2016) 30 tablet 5   No current facility-administered medications for this visit.     Allergies  Allergen Reactions  . Aspirin     Sick on stomach  . Codeine     Feel dizzy  . Diazepam     Sick on stomach  . Meperidine Hcl     Sick on stomach   . Morphine     Nausea/sick on stomach  . Propoxyphene     Review of Systems negative except from HPI and PMH  Physical  Exam BP 138/82 (BP Location: Left Arm, Patient Position: Sitting, Cuff Size: Normal)   Pulse 75   Ht 5\' 1"  (1.549 m)   Wt 145 lb (65.8 kg)   BMI 27.40 kg/m  Well developed and nourished in no acute distress HENT normal Neck supple   Device pocket well healed; without hematoma or erythema.  There is no tethering  Clear Regular rate and rhythm, no murmurs or gallops Abd-soft with active BS No Clubbing cyanosis edema Skin-warm and dry A & Oriented  Grossly normal sensory and motor function with tremor   ECG personally reviewed  atrial fibrillation with intermittent ventricular pacing -/07/37  Assessment and  Plan  HFpEF  Permanent atrial fib  Pacemaker Medtronic The patient's device was interrogated.  The information was reviewed. Device was reprogrammed [id number]  Orthostatic intolerance-a.m  parkinsons disease  Orthostasis continues to be an issue.  It may well be related to her hypertensive heart disease and vascular disease as well as her Parkinson's. She is very cautious.   She has not fallen.  Atrial fibrillation is permanent.  Anticoagulation dose is 10 mg?; We'll increase it to 15 mg.  On Anticoagulation;  No bleeding issues   Euvolemic continue current meds

## 2016-11-30 ENCOUNTER — Ambulatory Visit: Payer: Medicare Other

## 2016-11-30 ENCOUNTER — Ambulatory Visit (INDEPENDENT_AMBULATORY_CARE_PROVIDER_SITE_OTHER): Payer: Medicare Other | Admitting: Family Medicine

## 2016-11-30 ENCOUNTER — Encounter: Payer: Self-pay | Admitting: Family Medicine

## 2016-11-30 VITALS — BP 122/68 | HR 80 | Temp 98.9°F | Resp 18 | Ht 61.0 in | Wt 147.0 lb

## 2016-11-30 DIAGNOSIS — M545 Low back pain, unspecified: Secondary | ICD-10-CM

## 2016-11-30 DIAGNOSIS — E7800 Pure hypercholesterolemia, unspecified: Secondary | ICD-10-CM | POA: Diagnosis not present

## 2016-11-30 DIAGNOSIS — M810 Age-related osteoporosis without current pathological fracture: Secondary | ICD-10-CM | POA: Diagnosis not present

## 2016-11-30 DIAGNOSIS — I5032 Chronic diastolic (congestive) heart failure: Secondary | ICD-10-CM

## 2016-11-30 DIAGNOSIS — M549 Dorsalgia, unspecified: Secondary | ICD-10-CM | POA: Insufficient documentation

## 2016-11-30 DIAGNOSIS — I87329 Chronic venous hypertension (idiopathic) with inflammation of unspecified lower extremity: Secondary | ICD-10-CM

## 2016-11-30 DIAGNOSIS — I10 Essential (primary) hypertension: Secondary | ICD-10-CM

## 2016-11-30 DIAGNOSIS — G8929 Other chronic pain: Secondary | ICD-10-CM

## 2016-11-30 DIAGNOSIS — R0609 Other forms of dyspnea: Secondary | ICD-10-CM | POA: Diagnosis not present

## 2016-11-30 DIAGNOSIS — G20A1 Parkinson's disease without dyskinesia, without mention of fluctuations: Secondary | ICD-10-CM

## 2016-11-30 DIAGNOSIS — G20 Parkinson's disease: Secondary | ICD-10-CM | POA: Diagnosis not present

## 2016-11-30 DIAGNOSIS — Z0000 Encounter for general adult medical examination without abnormal findings: Secondary | ICD-10-CM | POA: Diagnosis not present

## 2016-11-30 MED ORDER — EZETIMIBE 10 MG PO TABS
10.0000 mg | ORAL_TABLET | Freq: Every day | ORAL | 5 refills | Status: AC
Start: 2016-11-30 — End: ?

## 2016-11-30 NOTE — Progress Notes (Signed)
Patient: Whitney Hoffman, Female    DOB: 31-Mar-1924, 83 y.o.   MRN: [id number] Visit Date: 11/30/2016  Today's Provider: Lelon Huh, MD   Chief Complaint  Patient presents with  . Medicare Wellness  . Hypertension    follow up  . Atrial Fibrillation    follow up  . Osteoporosis    follow up  . Hyperlipidemia    follow up   Subjective:    Annual wellness visit Whitney Hoffman is a 83 y.o. female. She feels fairly well. She reports exercising daily. She reports she is sleeping fairly well.  -----------------------------------------------------------  Hypertension, follow-up:  BP Readings from Last 3 Encounters:  11/22/16 138/82  04/26/16 138/64  03/02/16 130/82    She was last seen for hypertension 1 years ago.  BP at that visit was 122/70. Management since that visit includes no changes. She reports good compliance with treatment. She is not having side effects.  She is exercising. She is not adherent to low salt diet.   Outside blood pressures are 138/ 70. She is experiencing none.  Patient denies chest pain, chest pressure/discomfort, claudication, dyspnea, exertional chest pressure/discomfort, fatigue, irregular heart beat, lower extremity edema, near-syncope, orthopnea, palpitations, paroxysmal nocturnal dyspnea, syncope and tachypnea.   Cardiovascular risk factors include advanced age (older than 53 for men, 27 for women) and hypertension.  Use of agents associated with hypertension: none.     Weight trend: stable Wt Readings from Last 3 Encounters:  11/22/16 145 lb (65.8 kg)  04/26/16 151 lb 12.8 oz (68.9 kg)  03/02/16 152 lb (68.9 kg)    Current diet: in general, a "healthy" diet    ------------------------------------------------------------------------ Follow up of Atrial Fibrillation:  Patient was last seen for this problem 1 year ago and no changes were made.   Follow up of Parkinson's Disease:  Patient was last seen for this problem  1 yea ago and no changes were made. Patient was to continue routine follow ups with Dr. Manuella Ghazi. Patient comes in today reporting that she feels that her tremors have worsened.   Follow up of Osteoporosis:  Patient was last seen for this problem 1 year ago and no changes were made.    Lipid/Cholesterol, Follow-up:   Last seen for this1 years ago.  Management changes since that visit include stopping Lovastatin due to it causing nausea, constipation and muscle weakness. Patient later restarted back taking Lovastatin. . Last Lipid Panel:    Component Value Date/Time   CHOL 160 12/02/2015 0841   TRIG 110 12/02/2015 0841   HDL 58 12/02/2015 0841   CHOLHDL 2.8 12/02/2015 0841   LDLCALC 80 12/02/2015 0841    Risk factors for vascular disease include hypertension  She reports good compliance with treatment. She is having side effects.  Current symptoms include none and have been stable. Weight trend: stable Prior visit with dietician: no Current diet: in general, a "healthy" diet   Current exercise: leg lifts  Wt Readings from Last 3 Encounters:  11/22/16 145 lb (65.8 kg)  04/26/16 151 lb 12.8 oz (68.9 kg)  03/02/16 152 lb (68.9 kg)   Wt Readings from Last 3 Encounters:  11/30/16 147 lb (66.7 kg)  11/22/16 145 lb (65.8 kg)  04/26/16 151 lb 12.8 oz (68.9 kg)   Patient daughter is with her today who also used to take lovastatin. Daughter reports that when she changed from lovastatin to Zetia she felt much better, with resolution of joint and muscle pains,  and improvements in her digestive system. She states that patient has some similar symptoms and would like to try changing her to Zetia as well.    Patient is also having daily back pain which is mild, but limits her activity.  -------------------------------------------------------------------  Review of Systems  Constitutional: Negative for chills, fatigue and fever.  HENT: Positive for hearing loss, mouth sores, postnasal  drip, tinnitus and trouble swallowing. Negative for congestion, ear pain, rhinorrhea, sneezing and sore throat.   Eyes: Positive for photophobia and visual disturbance. Negative for pain and redness.  Respiratory: Positive for shortness of breath. Negative for cough and wheezing.   Cardiovascular: Negative for chest pain and leg swelling.  Gastrointestinal: Positive for constipation and diarrhea. Negative for abdominal pain, blood in stool and nausea.  Endocrine: Positive for cold intolerance and polydipsia. Negative for polyphagia.  Genitourinary: Positive for enuresis and frequency. Negative for dysuria, flank pain, hematuria, pelvic pain, vaginal bleeding and vaginal discharge.  Musculoskeletal: Positive for arthralgias, back pain and myalgias. Negative for gait problem and joint swelling.  Skin: Negative for rash.  Neurological: Positive for dizziness, tremors, weakness and light-headedness. Negative for seizures, numbness and headaches.  Hematological: Negative for adenopathy.  Psychiatric/Behavioral: Negative.  Negative for behavioral problems, confusion and dysphoric mood. The patient is not nervous/anxious and is not hyperactive.     Social History   Social History  . Marital status: Widowed    Spouse name: N/A  . Number of children: 4  . Years of education: N/A   Occupational History  . 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 Retired at age 78   Social History Main Topics  . Smoking status: Never Smoker  . Smokeless tobacco: Never Used     Comment: tobacco use- no   . Alcohol use No  . Drug use: No  . Sexual activity: Not on file   Other Topics Concern  . Not on file   Social History Narrative   Lives in [place]. Widowed. Retired. Lives alone     Past Medical History:  Diagnosis Date  . Atrial fibrillation ([place])   . Parkinson disease ([place])   . Tachy-brady syndrome ([place])    with medtronic dual chambe  rPCM     Patient Active Problem List   Diagnosis Date Noted  . Allergic rhinitis 06/15/2015  . Arthritis 06/15/2015  . History of colonic polyps 06/15/2015  . DDD (degenerative disc disease), lumbar 06/15/2015  . Fatigue 06/15/2015  . Irritable bladder 06/15/2015  . Left knee pain 06/15/2015  . Nevus 06/15/2015  . Parkinson's disease ([place]) 06/15/2015  . Peptic ulcer disease 06/15/2015  . Skin cancer of nose 06/15/2015  . Vertigo 06/15/2015  . Dyspnea on exertion 08/08/2013  . Chronic diastolic heart failure ([place]) 08/07/2012  . VENOUS HYPERTENSION, CHRONIC W/ INFLAMMATION 07/21/2010  . Essential hypertension 07/08/2010  . ATRIAL FIBRILLATION 07/08/2010  . PACEMAKER-Medtronic 07/08/2010  . Senile osteoporosis 10/18/2009  . Pure hypercholesterolemia 10/05/2009    Past Surgical History:  Procedure Laterality Date  . ABDOMINAL HYSTERECTOMY  1966   fibroids ovaries removed secondary to ovarian cyst  . APPENDECTOMY  1944  . BREAST BIOPSY  2010   left benign repeat ultrasound recommneded in 2011 [facility] [place]    . CHOLECYSTECTOMY  1969  . GALLBLADDER SURGERY    . HIP SURGERY Left    ORIF x 2; post  op infection Left hip  . PACEMAKER PLACEMENT  06/29/2010  . WRIST SURGERY  1996   Traumatic B    Her family history includes Alzheimer's disease in her father; Coronary artery disease in her father; Diabetes in her mother; Heart attack in her father.      Current Outpatient Prescriptions:  .  acetaminophen (TYLENOL 8 HOUR) 650 MG CR tablet, Take 650 mg by mouth as needed.  , Disp: , Rfl:  .  Cholecalciferol (HM VITAMIN D3) 4000 UNITS CAPS, Take 2,000 Units by mouth daily. , Disp: , Rfl:  .  Cyanocobalamin (B-12 PO), Take by mouth daily., Disp: , Rfl:  .  lovastatin (MEVACOR) 20 MG tablet, TAKE 1 TABLET BY MOUTH EVERY NIGHT AT BEDTIME, Disp: 30 tablet, Rfl: 12 .  meclizine (ANTIVERT) 25 MG tablet, Take 25 mg by mouth 3 (three) times daily as needed.  ,  Disp: , Rfl:  .  metoprolol (LOPRESSOR) 50 MG tablet, Take two tablets (100 mg) by mouth every morning and one tablet (50 mg) by mouth every evening, Disp: 90 tablet, Rfl: 3 .  oxybutynin (DITROPAN-XL) 5 MG 24 hr tablet, TAKE 1 TABLET BY MOUTH ONCE DAILY., Disp: 30 tablet, Rfl: 12 .  pyridoxine (B-6) 200 MG tablet, Take 200 mg by mouth daily., Disp: , Rfl:  .  Rivaroxaban (XARELTO) 15 MG TABS tablet, Take 1 tablet (15 mg total) by mouth 2 (two) times daily with a meal., Disp: , Rfl:   Patient Care Team: Birdie Sons, MD as PCP - General (Family Medicine) Birdie Sons, MD as Referring Physician (Family Medicine) Deboraha Sprang, MD as Consulting Physician (Cardiology) Vladimir Crofts, MD (Neurology)     Objective:   Vitals: BP 122/68 (BP Location: Left Arm, Patient Position: Sitting, Cuff Size: Large)   Pulse 80   Temp 98.9 F (37.2 C) (Oral)   Resp 18   Ht 5\' 1"  (1.549 m)   Wt 147 lb (66.7 kg)   SpO2 97% Comment: room air  BMI 27.78 kg/m   Physical Exam   General Appearance:    Alert, cooperative, no distress  Eyes:    PERRL, conjunctiva/corneas clear, EOM's intact       Lungs:     Clear to auscultation bilaterally, respirations unlabored  Heart:     Irregularly irregular rhythm. Normal rate   Neurologic:   Awake, alert, oriented x 3. No apparent focal neurological           defect.        Activities of Daily Living In your present state of health, do you have any difficulty performing the following activities: 11/30/2016  Hearing? Y  Vision? Y  Difficulty concentrating or making decisions? Y  Walking or climbing stairs? Y  Dressing or bathing? Y  Doing errands, shopping? Y  Some recent data might be hidden    Fall Risk Assessment Fall Risk  11/30/2016 11/24/2015 06/15/2015  Falls in the past year? No No Yes  Number falls in past yr: - - 1  Injury with Fall? - - Yes  Follow up - - Falls evaluation completed     Depression Screen PHQ 2/9 Scores 11/30/2016  11/24/2015 06/15/2015  PHQ - 2 Score 2 0 0  PHQ- 9 Score 13 - -    Cognitive Testing - 6-CIT  Correct? Score   What year is it? yes 0 0 or 4  What month is it? yes 0 0 or 3  Memorize:  Pia Mau,  31,  [place],      What time is it? (within 1 hour) yes 0 0 or 3  Count backwards from 20 yes 0 0, 2, or 4  Name the months of the year yes 0 0, 2, or 4  Repeat name & address above no 6 0, 2, 4, 6, 8, or 10       TOTAL SCORE  6/28   Interpretation:  Normal  Normal (0-7) Abnormal (8-28)     Current Exercise Habits: Home exercise routine, Type of exercise: Other - see comments (leg lifts), Time (Minutes): 10, Frequency (Times/Week): 5, Weekly Exercise (Minutes/Week): 50, Intensity: Mild Exercise limited by: Other - see comments   Audit-C Alcohol Use Screening  Question Answer Points  How often do you have alcoholic drink? never 0  On days you do drink alcohol, how many drinks do you typically consume? n/a 0  How oftey will you drink 6 or more in a total? never 0  Total Score:  0   A score of 3 or more in women, and 4 or more in men indicates increased risk for alcohol abuse, EXCEPT if all of the points are from question 1.   Assessment & Plan:     Annual Wellness Visit  Reviewed patient's Family Medical History Reviewed and updated list of patient's medical providers Assessment of cognitive impairment was done Assessed patient's functional ability Established a written schedule for health screening [place] Completed and Reviewed  Exercise Activities and Dietary recommendations Goals    None      Immunization History  Administered Date(s) Administered  . Pneumococcal Conjugate-13 05/13/2014  . Pneumococcal Polysaccharide-23 11/24/2015  . Tdap 09/30/2010  . Zoster 05/22/2012    Health Maintenance  Topic Date Due  . INFLUENZA VACCINE  03/22/2017  . TETANUS/TDAP  09/30/2020  . DEXA SCAN  Completed  . PNA vac Low Risk Adult   Completed     Discussed health benefits of physical activity, and encouraged her to engage in regular exercise appropriate for her age and condition.    ------------------------------------------------------------------------------------------------------------ 1. Medicare annual wellness visit, subsequent   2. Chronic venous hypertension with inflammation, unspecified laterality Stable. Continue current medications.     3. Essential hypertension Well controlled.  Continue current medications.    4. Chronic diastolic heart failure (HCC) Stable, continue routine follow up Dr. Caryl Comes  5. Parkinson's disease ([place]) Intolerant to multiple Parkinsons medications and not currently taking any. She is to continue regular follow up with Dr. Manuella Ghazi, although daughter states she may decide to have her see another neurologist for second opinion.   6. Pure hypercholesterolemia Try change from lovastatin to Zetia to see if she feels better. Check labs at follow up in 3-4 months.   7. Dyspnea on exertion Due to chronic cardiac conditions. Continue current medications.    8. Osteoporosis, unspecified osteoporosis type, unspecified pathological fracture presence Remotely on Boniva which she states she tolerating. I had prescribed her Evista for several year but apparently prescription ran out. She is not sure when she stopped taking it.  - DG Bone Density; Future  9. Low back pain Consider trial of muscle relaxer.   The entirety of the information documented in the History of Present Illness, Review of Systems and Physical Exam were personally obtained by me. Portions of this information were initially documented by Meyer Cory, CMA and reviewed by me for thoroughness and accuracy.   Lelon Huh, MD  [facility]

## 2016-12-01 ENCOUNTER — Telehealth: Payer: Self-pay | Admitting: Internal Medicine

## 2016-12-01 ENCOUNTER — Other Ambulatory Visit: Payer: Self-pay | Admitting: Internal Medicine

## 2016-12-01 MED ORDER — RIVAROXABAN 15 MG PO TABS: 15.0000 mg | ORAL_TABLET | Freq: Every day | ORAL | 6 refills | Status: AC

## 2016-12-01 NOTE — Telephone Encounter (Signed)
No previous call stating patient needed a refill to [facility]. RX being sent in now.

## 2016-12-01 NOTE — Telephone Encounter (Signed)
Pt daughter is calling stating she is at the pharmacy and is waiting on a refill that was to be send when patient was last seen here in office Please send in [place] She is currently at [facility].  She is a bit upset for she is standing waiting and needs this done asap

## 2016-12-06 ENCOUNTER — Telehealth: Payer: Self-pay | Admitting: Internal Medicine

## 2016-12-06 NOTE — Telephone Encounter (Signed)
Received prior authorization request from covermymeds for Pradaxa and this medication was discontinued. Archived request for this medication.

## 2016-12-21 ENCOUNTER — Ambulatory Visit: Payer: Medicare Other

## 2016-12-21 ENCOUNTER — Ambulatory Visit (INDEPENDENT_AMBULATORY_CARE_PROVIDER_SITE_OTHER): Payer: Medicare Other

## 2016-12-21 VITALS — BP 108/76 | HR 72 | Temp 99.4°F | Ht 61.0 in | Wt 145.6 lb

## 2016-12-21 DIAGNOSIS — Z0000 Encounter for general adult medical examination without abnormal findings: Secondary | ICD-10-CM | POA: Diagnosis not present

## 2016-12-21 NOTE — Progress Notes (Signed)
Subjective:   Whitney Hoffman is a 83 y.o. female who presents for Medicare Annual (Subsequent) preventive examination.  Review of Systems:  N/A  Cardiac Risk Factors include: advanced age (>73men, >49 women);dyslipidemia;hypertension     Objective:     Vitals: BP 108/76 (BP Location: Left Arm)   Pulse 72   Temp 99.4 F (37.4 C) (Oral)   Ht 5\' 1"  (1.549 m)   Wt 145 lb 9.6 oz (66 kg)   BMI 27.51 kg/m   Body mass index is 27.51 kg/m.   Tobacco History  Smoking Status  . Never Smoker  Smokeless Tobacco  . Never Used    Comment: tobacco use- no      Counseling given: Not Answered   Past Medical History:  Diagnosis Date  . Atrial fibrillation ([place])   . Parkinson disease ([place])   . Tachy-brady syndrome ([place])    with medtronic dual chambe rPCM   Past Surgical History:  Procedure Laterality Date  . ABDOMINAL HYSTERECTOMY  1966   fibroids ovaries removed secondary to ovarian cyst  . APPENDECTOMY  1944  . BREAST BIOPSY  2010   left benign repeat ultrasound recommneded in 2011 [facility] [place]    . CHOLECYSTECTOMY  1969  . GALLBLADDER SURGERY    . HIP SURGERY Left    ORIF x 2; post op infection Left hip  . PACEMAKER PLACEMENT  06/29/2010  . WRIST SURGERY  1996   Traumatic B   Family History  Problem Relation Age of Onset  . Diabetes Mother   . Coronary artery disease Father   . Heart attack Father   . Alzheimer's disease Father    History  Sexual Activity  . Sexual activity: Not on file    Outpatient Encounter Prescriptions as of 12/21/2016  Medication Sig  . acetaminophen (TYLENOL 8 HOUR) 650 MG CR tablet Take 650 mg by mouth as needed.    . Cholecalciferol (HM VITAMIN D3) 4000 UNITS CAPS Take 2,000 Units by mouth daily.   . Cyanocobalamin (B-12 PO) Take by mouth daily.  Marland Kitchen ezetimibe (ZETIA) 10 MG tablet Take 1 tablet (10 mg total) by mouth daily.  . meclizine (ANTIVERT) 25 MG tablet Take 25 mg by mouth 3 (three) times daily as  needed.    . metoprolol (LOPRESSOR) 50 MG tablet TAKE 2 TABLETS BY MOUTH ONCE EVERY MORNING AND TAKE 1 TABLET BY MOUTHEVERYEVENING  . oxybutynin (DITROPAN-XL) 5 MG 24 hr tablet TAKE 1 TABLET BY MOUTH ONCE DAILY.  Marland Kitchen pyridoxine (B-6) 200 MG tablet Take 200 mg by mouth daily.  . Rivaroxaban (XARELTO) 15 MG TABS tablet Take 1 tablet (15 mg total) by mouth daily with supper.  Alveda Reasons 20 MG TABS tablet TAKE 1 TABLET BY MOUTH ONCE DAILY   No facility-administered encounter medications on file as of 12/21/2016.     Activities of Daily Living In your present state of health, do you have any difficulty performing the following activities: 12/21/2016 11/30/2016  Hearing? Tempie Donning  Vision? Y Y  Difficulty concentrating or making decisions? Tempie Donning  Walking or climbing stairs? Y Y  Dressing or bathing? Y Y  Doing errands, shopping? Tempie Donning  Preparing Food and eating ? Y -  Using the Toilet? N -  In the past six months, have you accidently leaked urine? Y -  Do you have problems with loss of bowel control? N -  Managing your Medications? N -  Managing your Finances? Y -  Housekeeping or managing your Housekeeping? Y -  Some recent data might be hidden    Patient Care Team: Birdie Sons, MD as PCP - General (Family Medicine) Deboraha Sprang, MD as Consulting Physician (Cardiology) Vladimir Crofts, MD (Neurology)    Assessment:     Exercise Activities and Dietary recommendations Current Exercise Habits: Home exercise routine, Type of exercise: stretching, Time (Minutes): 10, Frequency (Times/Week): 4 (to 5 days a week), Weekly Exercise (Minutes/Week): 40, Intensity: Mild  Goals    . Increase water intake          Recommend increasing water intake to 4-6 glasses a day.      Fall Risk Fall Risk  12/21/2016 11/30/2016 11/24/2015 06/15/2015  Falls in the past year? No No No Yes  Number falls in past yr: - - - 1  Injury with Fall? - - - Yes  Risk for fall due to : Impaired balance/gait - - -  Follow up - -  - Falls evaluation completed   Depression Screen PHQ 2/9 Scores 12/21/2016 11/30/2016 11/24/2015 06/15/2015  PHQ - 2 Score 0 2 0 0  PHQ- 9 Score - 13 - -     Cognitive Function Completed on 11/30/16  Immunization History  Administered Date(s) Administered  . Pneumococcal Conjugate-13 05/13/2014  . Pneumococcal Polysaccharide-23 11/24/2015  . Tdap 09/30/2010  . Zoster 05/22/2012   Screening Tests Health Maintenance  Topic Date Due  . INFLUENZA VACCINE  03/22/2017  . TETANUS/TDAP  09/30/2020  . DEXA SCAN  Completed  . PNA vac Low Risk Adult  Completed      Plan:  I have personally reviewed and addressed the Medicare Annual Wellness questionnaire and have noted the following in the patient's chart:  A. Medical and social history B. Use of alcohol, tobacco or illicit drugs  C. Current medications and supplements D. Functional ability and status E.  Nutritional status F.  Physical activity G. Advance directives H. List of other physicians I.  Hospitalizations, surgeries, and ER visits in previous 12 months J.  [place] such as hearing and vision if needed, cognitive and depression L. Referrals and appointments - none  In addition, I have reviewed and discussed with patient certain preventive protocols, quality metrics, and best practice recommendations. A written personalized care plan for preventive services as well as general preventive health recommendations were provided to patient.  See attached scanned questionnaire for additional information.   Signed,  Fabio Neighbors, LPN Nurse Health Advisor   MD Recommendations: None.  I have reviewed the health advisor's note, was available for consultation, and agree with documentation and plan  Lelon Huh, MD

## 2016-12-21 NOTE — Patient Instructions (Signed)
Whitney Hoffman , Thank you for taking time to come for your Medicare Wellness Visit. I appreciate your ongoing commitment to your health goals. Please review the following plan we discussed and let me know if I can assist you in the future.   Screening recommendations/referrals: Colonoscopy: completed 04/22/10 Mammogram: completed 10/20/10 Bone Density: completed 10/16/10 Recommended yearly ophthalmology/optometry visit for glaucoma screening and checkup Recommended yearly dental visit for hygiene and checkup  Vaccinations: Influenza vaccine: due 04/2017 Pneumococcal vaccine: completed series Tdap vaccine: completed 09/30/10, due 09/2020 Shingles vaccine: completed 05/22/12    Advanced directives: Please bring a copy of your POA (Power of [place]) and/or Living Will to your next appointment.   Conditions/risks identified: Recommend increasing water intake to 4-6 glasses a day.  Next appointment: 03/03/17 @ 2:30 PM   Preventive Care 65 Years and Older, Female Preventive care refers to lifestyle choices and visits with your health care provider that can promote health and wellness. What does preventive care include?  A yearly physical exam. This is also called an annual well check.  Dental exams once or twice a year.  Routine eye exams. Ask your health care provider how often you should have your eyes checked.  Personal lifestyle choices, including:  Daily care of your teeth and gums.  Regular physical activity.  Eating a healthy diet.  Avoiding tobacco and drug use.  Limiting alcohol use.  Practicing safe sex.  Taking low-dose aspirin every day.  Taking vitamin and mineral supplements as recommended by your health care provider. What happens during an annual well check? The services and screenings done by your health care provider during your annual well check will depend on your age, overall health, lifestyle risk factors, and family history of disease. Counseling    Your health care provider may ask you questions about your:  Alcohol use.  Tobacco use.  Drug use.  Emotional well-being.  Home and relationship well-being.  Sexual activity.  Eating habits.  History of falls.  Memory and ability to understand (cognition).  Work and work Statistician.  Reproductive health. Screening  You may have the following tests or measurements:  Height, weight, and BMI.  Blood pressure.  Lipid and cholesterol levels. These may be checked every 5 years, or more frequently if you are over 102 years old.  Skin check.  Lung cancer screening. You may have this screening every year starting at age 28 if you have a 30-pack-year history of smoking and currently smoke or have quit within the past 15 years.  Fecal occult blood test (FOBT) of the stool. You may have this test every year starting at age 7.  Flexible sigmoidoscopy or colonoscopy. You may have a sigmoidoscopy every 5 years or a colonoscopy every 10 years starting at age 10.  Hepatitis C blood test.  Hepatitis B blood test.  Sexually transmitted disease (STD) testing.  Diabetes screening. This is done by checking your blood sugar (glucose) after you have not eaten for a while (fasting). You may have this done every 1-3 years.  Bone density scan. This is done to screen for osteoporosis. You may have this done starting at age 68.  Mammogram. This may be done every 1-2 years. Talk to your health care provider about how often you should have regular mammograms. Talk with your health care provider about your test results, treatment options, and if necessary, the need for more tests. Vaccines  Your health care provider may recommend certain vaccines, such as:  Influenza vaccine. This is  recommended every year.  Tetanus, diphtheria, and acellular pertussis (Tdap, Td) vaccine. You may need a Td booster every 10 years.  Zoster vaccine. You may need this after age 55.  Pneumococcal 13-valent  conjugate (PCV13) vaccine. One dose is recommended after age 58.  Pneumococcal polysaccharide (PPSV23) vaccine. One dose is recommended after age 59. Talk to your health care provider about which screenings and vaccines you need and how often you need them. This information is not intended to replace advice given to you by your health care provider. Make sure you discuss any questions you have with your health care provider. Document Released: 09/04/2015 Document Revised: 04/27/2016 Document Reviewed: 06/09/2015 Elsevier Interactive Patient Education  2017 [place] Prevention in the Home Falls can cause injuries. They can happen to people of all ages. There are many things you can do to make your home safe and to help prevent falls. What can I do on the outside of my home?  Regularly fix the edges of walkways and driveways and fix any cracks.  Remove anything that might make you trip as you walk through a door, such as a raised step or threshold.  Trim any bushes or trees on the path to your home.  Use bright outdoor lighting.  Clear any walking paths of anything that might make someone trip, such as rocks or tools.  Regularly check to see if handrails are loose or broken. Make sure that both sides of any steps have handrails.  Any raised decks and porches should have guardrails on the edges.  Have any leaves, snow, or ice cleared regularly.  Use sand or salt on walking paths during winter.  Clean up any spills in your garage right away. This includes oil or grease spills. What can I do in the bathroom?  Use night lights.  Install grab bars by the toilet and in the tub and shower. Do not use towel bars as grab bars.  Use non-skid mats or decals in the tub or shower.  If you need to sit down in the shower, use a plastic, non-slip stool.  Keep the floor dry. Clean up any water that spills on the floor as soon as it happens.  Remove soap buildup in the tub or  shower regularly.  Attach bath mats securely with double-sided non-slip rug tape.  Do not have throw rugs and other things on the floor that can make you trip. What can I do in the bedroom?  Use night lights.  Make sure that you have a light by your bed that is easy to reach.  Do not use any sheets or blankets that are too big for your bed. They should not hang down onto the floor.  Have a firm chair that has side arms. You can use this for support while you get dressed.  Do not have throw rugs and other things on the floor that can make you trip. What can I do in the kitchen?  Clean up any spills right away.  Avoid walking on wet floors.  Keep items that you use a lot in easy-to-reach places.  If you need to reach something above you, use a strong step stool that has a grab bar.  Keep electrical cords out of the way.  Do not use floor polish or wax that makes floors slippery. If you must use wax, use non-skid floor wax.  Do not have throw rugs and other things on the floor that can make you trip.  What can I do with my stairs?  Do not leave any items on the stairs.  Make sure that there are handrails on both sides of the stairs and use them. Fix handrails that are broken or loose. Make sure that handrails are as long as the stairways.  Check any carpeting to make sure that it is firmly attached to the stairs. Fix any carpet that is loose or worn.  Avoid having throw rugs at the top or bottom of the stairs. If you do have throw rugs, attach them to the floor with carpet tape.  Make sure that you have a light switch at the top of the stairs and the bottom of the stairs. If you do not have them, ask someone to add them for you. What else can I do to help prevent falls?  Wear shoes that:  Do not have high heels.  Have rubber bottoms.  Are comfortable and fit you well.  Are closed at the toe. Do not wear sandals.  If you use a stepladder:  Make sure that it is fully  opened. Do not climb a closed stepladder.  Make sure that both sides of the stepladder are locked into place.  Ask someone to hold it for you, if possible.  Clearly mark and make sure that you can see:  Any grab bars or handrails.  First and last steps.  Where the edge of each step is.  Use tools that help you move around (mobility aids) if they are needed. These include:  Canes.  Walkers.  Scooters.  Crutches.  Turn on the lights when you go into a dark area. Replace any light bulbs as soon as they burn out.  Set up your furniture so you have a clear path. Avoid moving your furniture around.  If any of your floors are uneven, fix them.  If there are any pets around you, be aware of where they are.  Review your medicines with your doctor. Some medicines can make you feel dizzy. This can increase your chance of falling. Ask your doctor what other things that you can do to help prevent falls. This information is not intended to replace advice given to you by your health care provider. Make sure you discuss any questions you have with your health care provider. Document Released: 06/04/2009 Document Revised: 01/14/2016 Document Reviewed: 09/12/2014 Elsevier Interactive Patient Education  2017 [employer].

## 2017-01-02 ENCOUNTER — Encounter: Payer: Self-pay | Admitting: Emergency Medicine

## 2017-01-02 ENCOUNTER — Emergency Department: Payer: Medicare Other

## 2017-01-02 ENCOUNTER — Emergency Department
Admission: EM | Admit: 2017-01-02 | Discharge: 2017-01-02 | Disposition: A | Discharge status: Home / Self Care | Payer: Medicare Other | Attending: Emergency Medicine | Admitting: Emergency Medicine

## 2017-01-02 DIAGNOSIS — Y92009 Unspecified place in unspecified non-institutional (private) residence as the place of occurrence of the external cause: Secondary | ICD-10-CM | POA: Insufficient documentation

## 2017-01-02 DIAGNOSIS — M25511 Pain in right shoulder: Secondary | ICD-10-CM | POA: Diagnosis not present

## 2017-01-02 DIAGNOSIS — S3210XA Unspecified fracture of sacrum, initial encounter for closed fracture: Secondary | ICD-10-CM

## 2017-01-02 DIAGNOSIS — Y9389 Activity, other specified: Secondary | ICD-10-CM | POA: Insufficient documentation

## 2017-01-02 DIAGNOSIS — S3219XA Other fracture of sacrum, initial encounter for closed fracture: Secondary | ICD-10-CM | POA: Diagnosis not present

## 2017-01-02 DIAGNOSIS — M545 Low back pain: Secondary | ICD-10-CM | POA: Diagnosis not present

## 2017-01-02 DIAGNOSIS — W19XXXA Unspecified fall, initial encounter: Secondary | ICD-10-CM | POA: Diagnosis not present

## 2017-01-02 DIAGNOSIS — I5032 Chronic diastolic (congestive) heart failure: Secondary | ICD-10-CM | POA: Diagnosis not present

## 2017-01-02 DIAGNOSIS — G20 Parkinson's disease: Secondary | ICD-10-CM | POA: Insufficient documentation

## 2017-01-02 DIAGNOSIS — Y999 Unspecified external cause status: Secondary | ICD-10-CM | POA: Insufficient documentation

## 2017-01-02 DIAGNOSIS — Z79899 Other long term (current) drug therapy: Secondary | ICD-10-CM | POA: Diagnosis not present

## 2017-01-02 DIAGNOSIS — S4991XA Unspecified injury of right shoulder and upper arm, initial encounter: Secondary | ICD-10-CM | POA: Diagnosis not present

## 2017-01-02 DIAGNOSIS — W1839XA Other fall on same level, initial encounter: Secondary | ICD-10-CM | POA: Insufficient documentation

## 2017-01-02 DIAGNOSIS — S299XXA Unspecified injury of thorax, initial encounter: Secondary | ICD-10-CM | POA: Diagnosis not present

## 2017-01-02 DIAGNOSIS — I110 Hypertensive heart disease with heart failure: Secondary | ICD-10-CM | POA: Insufficient documentation

## 2017-01-02 DIAGNOSIS — S3992XA Unspecified injury of lower back, initial encounter: Secondary | ICD-10-CM | POA: Diagnosis not present

## 2017-01-02 MED ORDER — ACETAMINOPHEN ER 650 MG PO TBCR: 650.0000 mg | EXTENDED_RELEASE_TABLET | Freq: Three times a day (TID) | ORAL | 0 refills | Status: DC | PRN

## 2017-01-02 NOTE — ED Provider Notes (Signed)
[facility] Emergency Department Provider Note   ____________________________________________    I have reviewed the triage vital signs and the nursing notes.   HISTORY  Chief Complaint Fall     HPI Whitney Hoffman is a 83 y.o. female who presents with complaints of a fall. Patient reports she lost her balance and fell backwards and struck her right shoulder against the wall and then fell onto her bottom. She denies a head injury. This occurred 2 days ago but she continues to have pain in her tailbone so she decided to come to the emergency department. No nausea or vomiting or abdominal pain. No chest pain. No shortness of breath   Past Medical History:  Diagnosis Date  . Atrial fibrillation ([place])   . Parkinson disease ([place])   . Tachy-brady syndrome ([place])    with medtronic dual chambe rPCM    Patient Active Problem List   Diagnosis Date Noted  . Osteoporosis 11/30/2016  . Low back pain 11/30/2016  . Allergic rhinitis 06/15/2015  . Arthritis 06/15/2015  . History of colonic polyps 06/15/2015  . DDD (degenerative disc disease), lumbar 06/15/2015  . Fatigue 06/15/2015  . Irritable bladder 06/15/2015  . Left knee pain 06/15/2015  . Nevus 06/15/2015  . Parkinson's disease ([place]) 06/15/2015  . Peptic ulcer disease 06/15/2015  . Skin cancer of nose 06/15/2015  . Vertigo 06/15/2015  . Dyspnea on exertion 08/08/2013  . Chronic diastolic heart failure ([place]) 08/07/2012  . VENOUS HYPERTENSION, CHRONIC W/ INFLAMMATION 07/21/2010  . Essential hypertension 07/08/2010  . ATRIAL FIBRILLATION 07/08/2010  . PACEMAKER-Medtronic 07/08/2010  . Senile osteoporosis 10/18/2009  . Pure hypercholesterolemia 10/05/2009    Past Surgical History:  Procedure Laterality Date  . ABDOMINAL HYSTERECTOMY  1966   fibroids ovaries removed secondary to ovarian cyst  . APPENDECTOMY  1944  . BREAST BIOPSY  2010   left benign repeat ultrasound recommneded in 2011  [place] [place]    . CHOLECYSTECTOMY  1969  . GALLBLADDER SURGERY    . HIP SURGERY Left    ORIF x 2; post op infection Left hip  . PACEMAKER PLACEMENT  06/29/2010  . WRIST SURGERY  1996   Traumatic B    Prior to Admission medications   Medication Sig Start Date End Date Taking? Authorizing Provider  acetaminophen (TYLENOL 8 HOUR) 650 MG CR tablet Take 1 tablet (650 mg total) by mouth every 8 (eight) hours as needed. 01/02/17   Lavonia Drafts, MD  Cholecalciferol (HM VITAMIN D3) 4000 UNITS CAPS Take 2,000 Units by mouth daily.     [provider]  Cyanocobalamin (B-12 PO) Take by mouth daily.    [provider]  ezetimibe (ZETIA) 10 MG tablet Take 1 tablet (10 mg total) by mouth daily. 11/30/16   Birdie Sons, MD  meclizine (ANTIVERT) 25 MG tablet Take 25 mg by mouth 3 (three) times daily as needed.      [provider]  metoprolol (LOPRESSOR) 50 MG tablet TAKE 2 TABLETS BY MOUTH ONCE EVERY MORNING AND TAKE 1 TABLET BY MOUTHEVERYEVENING 12/01/16   Deboraha Sprang, MD  oxybutynin (DITROPAN-XL) 5 MG 24 hr tablet TAKE 1 TABLET BY MOUTH ONCE DAILY. 09/03/16   Birdie Sons, MD  pyridoxine (B-6) 200 MG tablet Take 200 mg by mouth daily.    [provider]  Rivaroxaban (XARELTO) 15 MG TABS tablet Take 1 tablet (15 mg total) by mouth daily with supper. 12/01/16  Deboraha Sprang, MD  XARELTO 20 MG TABS tablet TAKE 1 TABLET BY MOUTH ONCE DAILY 12/01/16   Deboraha Sprang, MD     Allergies Aspirin; Codeine; Diazepam; Meperidine hcl; Morphine; and Propoxyphene  Family History  Problem Relation Age of Onset  . Diabetes Mother   . Coronary artery disease Father   . Heart attack Father   . Alzheimer's disease Father     Social History Social History  Substance Use Topics  . Smoking status: Never Smoker  . Smokeless tobacco: Never Used     Comment: tobacco use- no   . Alcohol use No    Review of Systems  Constitutional: No  fever/chills Eyes: No visual changes.  ENT: No Neck pain Cardiovascular: Denies chest pain. Respiratory: Denies shortness of breath. Gastrointestinal: No abdominal pain.  No nausea, no vomiting.   Genitourinary: Negative for dysuria. Musculoskeletal:As above Skin: Negative for rash. Neurological: Negative for headaches or weakness   ____________________________________________   PHYSICAL EXAM:  VITAL SIGNS: ED Triage Vitals  Enc Vitals Group     BP 01/02/17 1228 (!) 141/88     Pulse Rate 01/02/17 1228 (!) 110     Resp 01/02/17 1228 20     Temp 01/02/17 1228 98.2 F (36.8 C)     Temp Source 01/02/17 1228 Oral     SpO2 01/02/17 1225 97 %     Weight 01/02/17 1228 145 lb (65.8 kg)     Height 01/02/17 1228 5\' 1"  (1.549 m)     Head Circumference --      Peak Flow --      Pain Score 01/02/17 1227 10     Pain Loc --      Pain Edu? --      Excl. in [place]? --     Constitutional: Alert and oriented. No acute distress. Pleasant and interactive Eyes: Conjunctivae are normal.  Head: Atraumatic. Nose: No congestion/rhinnorhea. Mouth/Throat: Mucous membranes are moist.   Neck:  Painless ROM, No vertebral tenderness to palpation Cardiovascular: Normal rate, regular rhythm. Grossly normal heart sounds.  Good peripheral circulation. Respiratory: Normal respiratory effort.  No retractions. Lungs CTAB. Gastrointestinal: Soft and nontender. No distention.  No CVA tenderness. Genitourinary: deferred Musculoskeletal: Full range of motion of all extremities, no vertebral tenderness to palpation.  Warm and well perfused throughout. No pelvic tenderness to palpation Neurologic:  Normal speech and language. No gross focal neurologic deficits are appreciated.  Skin:  Skin is warm, dry and intact. No rash noted. Psychiatric: Mood and affect are normal. Speech and behavior are normal.  ____________________________________________   LABS (all labs ordered are listed, but only abnormal results are  displayed)  Labs Reviewed - No data to display ____________________________________________  EKG  None ____________________________________________  RADIOLOGY  Sacral fracture Shoulder x-ray negative Thoracic spine x-ray negative Lumbar spine x-ray negative ____________________________________________   PROCEDURES  Procedure(s) performed: No    Critical Care performed: No ____________________________________________   INITIAL IMPRESSION / ASSESSMENT AND PLAN / ED COURSE  Pertinent labs & imaging results that were available during my care of the patient were reviewed by me and considered in my medical decision making (see chart for details).  Patient has evidence of a sacral fracture, she is able to bear weight. Recommend pain control and outpatient follow-up with her orthopedist.    ____________________________________________   FINAL CLINICAL IMPRESSION(S) / ED DIAGNOSES  Final diagnoses:  Closed fracture of sacrum, unspecified fracture morphology, initial encounter ([place])      NEW  MEDICATIONS STARTED DURING THIS VISIT:  Discharge Medication List as of 01/02/2017  1:51 PM       Note:  This document was prepared using Dragon voice recognition software and may include unintentional dictation errors.    Lavonia Drafts, MD 01/02/17 [phone]

## 2017-01-02 NOTE — ED Triage Notes (Signed)
Pt arrived via EMS from home s/p at home.  Pt states she uses a walker at home and usually stands up to get her balance. Pt states when she stood up she got dizzy and fell back into the wall. Pt c/o right shoulder pain and low back pain. Per EMS and pt, report 10/10 pain when ambulating. Pt states the pain is minimal when she is at rest. Pt has metal rod in left leg from 1997. Pt has small abrasion to left lower leg, bandaid in place.

## 2017-01-04 ENCOUNTER — Telehealth: Payer: Self-pay | Admitting: Internal Medicine

## 2017-01-04 NOTE — Telephone Encounter (Signed)
Spoke with patients daughter per release form and she wanted to know if patient could take aleve for pain. Let her know that I was not aware of any interactions but she could also check with the pharmacist as well. She verbalized understanding with no further questions at this time.

## 2017-01-04 NOTE — Telephone Encounter (Signed)
Pt daughter called, states on Monday pt broker her sacrum and would like to know what she can take for pain. She states she cannot take narcotics. She asks if she can take Aleve. Please call and advise. Pt is in a lot of pain.

## 2017-01-08 NOTE — Telephone Encounter (Signed)
yes

## 2017-01-09 ENCOUNTER — Telehealth: Payer: Self-pay | Admitting: Family Medicine

## 2017-01-09 NOTE — Telephone Encounter (Signed)
Please advise. Emily Drozdowski, CMA  

## 2017-01-09 NOTE — Telephone Encounter (Signed)
S/w pt's daughter, Roselyn Reef, of Dr. Olin Pia recommendations. She verbalized understanding and is agreeable with plan.

## 2017-01-09 NOTE — Telephone Encounter (Signed)
Does she have prescription for furosemide? If not can send in prescription 20mg  one a day as needed, #30, rf x 0. Only need to take if swelling if is causing problems or bothering her.

## 2017-01-09 NOTE — Telephone Encounter (Signed)
Pt daughter, Narda Rutherford states both of her ankles are swelling and pt daughter is asking if her mom can get a Rx to help with this.  [facility].  KU#[phone]/OI

## 2017-01-10 MED ORDER — FUROSEMIDE 20 MG PO TABS: 20.0000 mg | ORAL_TABLET | Freq: Every day | ORAL | 0 refills | Status: DC

## 2017-01-10 NOTE — Telephone Encounter (Signed)
Spoke with daughter, Narda Rutherford, patient does not have Furosemide anymore. RX sent in and daughter advised on when patient should take it-aa

## 2017-01-11 ENCOUNTER — Emergency Department: Payer: Medicare Other

## 2017-01-11 ENCOUNTER — Emergency Department
Admission: EM | Admit: 2017-01-11 | Discharge: 2017-01-11 | Disposition: A | Discharge status: Home / Self Care | Payer: Medicare Other | Attending: Emergency Medicine | Admitting: Emergency Medicine

## 2017-01-11 ENCOUNTER — Encounter: Payer: Self-pay | Admitting: Emergency Medicine

## 2017-01-11 DIAGNOSIS — I110 Hypertensive heart disease with heart failure: Secondary | ICD-10-CM | POA: Insufficient documentation

## 2017-01-11 DIAGNOSIS — W19XXXA Unspecified fall, initial encounter: Secondary | ICD-10-CM | POA: Insufficient documentation

## 2017-01-11 DIAGNOSIS — Z79899 Other long term (current) drug therapy: Secondary | ICD-10-CM | POA: Insufficient documentation

## 2017-01-11 DIAGNOSIS — Y999 Unspecified external cause status: Secondary | ICD-10-CM | POA: Diagnosis not present

## 2017-01-11 DIAGNOSIS — I5032 Chronic diastolic (congestive) heart failure: Secondary | ICD-10-CM | POA: Insufficient documentation

## 2017-01-11 DIAGNOSIS — Z85828 Personal history of other malignant neoplasm of skin: Secondary | ICD-10-CM | POA: Insufficient documentation

## 2017-01-11 DIAGNOSIS — Y929 Unspecified place or not applicable: Secondary | ICD-10-CM | POA: Insufficient documentation

## 2017-01-11 DIAGNOSIS — Y939 Activity, unspecified: Secondary | ICD-10-CM | POA: Insufficient documentation

## 2017-01-11 DIAGNOSIS — L03116 Cellulitis of left lower limb: Secondary | ICD-10-CM | POA: Diagnosis not present

## 2017-01-11 DIAGNOSIS — M7989 Other specified soft tissue disorders: Secondary | ICD-10-CM | POA: Diagnosis not present

## 2017-01-11 DIAGNOSIS — S99912A Unspecified injury of left ankle, initial encounter: Secondary | ICD-10-CM | POA: Diagnosis present

## 2017-01-11 DIAGNOSIS — S93402A Sprain of unspecified ligament of left ankle, initial encounter: Secondary | ICD-10-CM

## 2017-01-11 DIAGNOSIS — M25572 Pain in left ankle and joints of left foot: Secondary | ICD-10-CM | POA: Diagnosis not present

## 2017-01-11 MED ORDER — CEPHALEXIN 500 MG PO CAPS: 500.0000 mg | ORAL_CAPSULE | Freq: Four times a day (QID) | ORAL | 0 refills | Status: AC

## 2017-01-11 NOTE — Discharge Instructions (Signed)
Please seek medical attention for any high fevers, chest pain, shortness of breath, change in behavior, persistent vomiting, bloody stool or any other new or concerning symptoms.  

## 2017-01-11 NOTE — ED Notes (Signed)
NAD noted at time of D/C. Pt taken to the lobby via wheelchair at this time. Pt assisted to the bathroom by Waunita Schooner, EDT-P and this RN prior to D/C. Tolerated well. Pt and family deny comments/concerns regarding D/C.

## 2017-01-11 NOTE — ED Triage Notes (Signed)
Pt presents to ED via [facility] with c/o L ankle bruising, swelling, and pain with ambulation. Per EMS pt was seen after a fall on 5/14 and was treated for back pain, today presents to ED with noted swelling and bruising to L ankle. Pt denies pain with laying down, states significant pain with walking.

## 2017-01-11 NOTE — ED Provider Notes (Signed)
[facility] Emergency Department Provider Note  ____________________________________________   I have reviewed the triage vital signs and the nursing notes.   HISTORY  Chief Complaint Left ankle pain  History limited by: Not Limited   HPI Whitney Hoffman is a 83 y.o. female who presents to the emergency department today via EMS because of concern for left ankle pain. The patient was seen in the emergency department 9 days ago for a fall and diagnosed with a sacral fracture. The patient states that initially she did not have any pain in her lower half of the body. She states that since the fall she has had increasing pain and associated swelling to her left ankle. She denies any further falls.    Past Medical History:  Diagnosis Date  . Atrial fibrillation ([place])   . Parkinson disease ([place])   . Tachy-brady syndrome ([place])    with medtronic dual chambe rPCM    Patient Active Problem List   Diagnosis Date Noted  . Osteoporosis 11/30/2016  . Low back pain 11/30/2016  . Allergic rhinitis 06/15/2015  . Arthritis 06/15/2015  . History of colonic polyps 06/15/2015  . DDD (degenerative disc disease), lumbar 06/15/2015  . Fatigue 06/15/2015  . Irritable bladder 06/15/2015  . Left knee pain 06/15/2015  . Nevus 06/15/2015  . Parkinson's disease ([place]) 06/15/2015  . Peptic ulcer disease 06/15/2015  . Skin cancer of nose 06/15/2015  . Vertigo 06/15/2015  . Dyspnea on exertion 08/08/2013  . Chronic diastolic heart failure ([place]) 08/07/2012  . VENOUS HYPERTENSION, CHRONIC W/ INFLAMMATION 07/21/2010  . Essential hypertension 07/08/2010  . ATRIAL FIBRILLATION 07/08/2010  . PACEMAKER-Medtronic 07/08/2010  . Senile osteoporosis 10/18/2009  . Pure hypercholesterolemia 10/05/2009    Past Surgical History:  Procedure Laterality Date  . ABDOMINAL HYSTERECTOMY  1966   fibroids ovaries removed secondary to ovarian cyst  . APPENDECTOMY  1944  . BREAST BIOPSY  2010    left benign repeat ultrasound recommneded in 2011 [facility] [place]    . CHOLECYSTECTOMY  1969  . GALLBLADDER SURGERY    . HIP SURGERY Left    ORIF x 2; post op infection Left hip  . PACEMAKER PLACEMENT  06/29/2010  . WRIST SURGERY  1996   Traumatic B    Prior to Admission medications   Medication Sig Start Date End Date Taking? Authorizing Provider  acetaminophen (TYLENOL 8 HOUR) 650 MG CR tablet Take 1 tablet (650 mg total) by mouth every 8 (eight) hours as needed. 01/02/17   Lavonia Drafts, MD  Cholecalciferol (HM VITAMIN D3) 4000 UNITS CAPS Take 2,000 Units by mouth daily.     [provider]  Cyanocobalamin (B-12 PO) Take by mouth daily.    [provider]  ezetimibe (ZETIA) 10 MG tablet Take 1 tablet (10 mg total) by mouth daily. 11/30/16   Birdie Sons, MD  furosemide (LASIX) 20 MG tablet Take 1 tablet (20 mg total) by mouth daily. 01/10/17   Birdie Sons, MD  meclizine (ANTIVERT) 25 MG tablet Take 25 mg by mouth 3 (three) times daily as needed.      [provider]  metoprolol (LOPRESSOR) 50 MG tablet TAKE 2 TABLETS BY MOUTH ONCE EVERY MORNING AND TAKE 1 TABLET BY MOUTHEVERYEVENING 12/01/16   Deboraha Sprang, MD  oxybutynin (DITROPAN-XL) 5 MG 24 hr tablet TAKE 1 TABLET BY MOUTH ONCE DAILY. 09/03/16   Birdie Sons, MD  pyridoxine (B-6) 200 MG tablet Take 200  mg by mouth daily.    [provider]  Rivaroxaban (XARELTO) 15 MG TABS tablet Take 1 tablet (15 mg total) by mouth daily with supper. 12/01/16   Deboraha Sprang, MD  XARELTO 20 MG TABS tablet TAKE 1 TABLET BY MOUTH ONCE DAILY 12/01/16   Deboraha Sprang, MD    Allergies Aspirin; Codeine; Diazepam; Meperidine hcl; Morphine; and Propoxyphene  Family History  Problem Relation Age of Onset  . Diabetes Mother   . Coronary artery disease Father   . Heart attack Father   . Alzheimer's disease Father     Social History Social History  Substance Use Topics  .  Smoking status: Never Smoker  . Smokeless tobacco: Never Used     Comment: tobacco use- no   . Alcohol use No    Review of Systems Constitutional: No fever/chills Eyes: No visual changes. ENT: No sore throat. Cardiovascular: Denies chest pain. Respiratory: Denies shortness of breath. Gastrointestinal: No abdominal pain.  No nausea, no vomiting.  No diarrhea.   Genitourinary: Negative for dysuria. Musculoskeletal: Positive for left ankle pain. Skin: Positive for bruising around the left ankle.  Neurological: Negative for headaches, focal weakness or numbness.  ____________________________________________   PHYSICAL EXAM:  VITAL SIGNS: ED Triage Vitals  Enc Vitals Group     BP 160/99     Pulse 83     Resp 16     Temp 98.9     Temp src      SpO2 97   Constitutional: Alert and oriented. Well appearing and in no distress. Eyes: Conjunctivae are normal.  ENT   Head: Normocephalic and atraumatic.   Nose: No congestion/rhinnorhea.   Mouth/Throat: Mucous membranes are moist.   Neck: No stridor. Hematological/Lymphatic/Immunilogical: No cervical lymphadenopathy. Cardiovascular: Normal rate, regular rhythm.  No murmurs, rubs, or gallops.  Respiratory: Normal respiratory effort without tachypnea nor retractions. Breath sounds are clear and equal bilaterally. No wheezes/rales/rhonchi. Gastrointestinal: Soft and non tender. No rebound. No guarding.  Genitourinary: Deferred Musculoskeletal: Left ankle with swelling. Some bruising noted laterally and medially. Tender to palpation laterally. No tenderness with dorsi or plantarflexion.  Neurologic:  Normal speech and language. No gross focal neurologic deficits are appreciated.  Skin:  Some scabs noted to left calf, with some surrounding erythema and warmth. Psychiatric: Mood and affect are normal. Speech and behavior are normal. Patient exhibits appropriate insight and  judgment.  ____________________________________________    LABS (pertinent positives/negatives)  None  ____________________________________________   EKG  None  ____________________________________________    RADIOLOGY  Left ankle IMPRESSION:  Diffuse soft tissue swelling. No acute bony or joint abnormality.  Mild diffuse degenerative change.     ____________________________________________   PROCEDURES  Procedures  ____________________________________________   INITIAL IMPRESSION / ASSESSMENT AND PLAN / ED COURSE  Pertinent labs & imaging results that were available during my care of the patient were reviewed by me and considered in my medical decision making (see chart for details).  Patient presented to the emergency department today because of concerns for left ankle pain and swelling resulting from a fall roughly 9 days ago. X-ray without any osseous injury. At this point I think patient with a sprained ankle. She did suffer some injuries to her left calf which are now scabbing over. There was however some surrounding erythema and warmth to one of them. To think this likely represents a cellulitis. This point I do not think the ankle secondary to a septic joint given lack of warmth, redness or  pain with dorsi or plantar flexion at that joint. Will place patient in an ankle splint. Will have patient follow-up with orthopedics.  ____________________________________________   FINAL CLINICAL IMPRESSION(S) / ED DIAGNOSES  Final diagnoses:  Sprain of left ankle, unspecified ligament, initial encounter  Cellulitis of left lower extremity     Note: This dictation was prepared with Dragon dictation. Any transcriptional errors that result from this process are unintentional     Nance Pear, MD 01/11/17 1749

## 2017-01-17 ENCOUNTER — Ambulatory Visit
Admission: RE | Admit: 2017-01-17 | Discharge: 2017-01-17 | Disposition: A | Discharge status: Home / Self Care | Payer: Medicare Other | Source: Ambulatory Visit | Attending: Family Medicine | Admitting: Family Medicine

## 2017-01-17 DIAGNOSIS — M810 Age-related osteoporosis without current pathological fracture: Secondary | ICD-10-CM | POA: Diagnosis not present

## 2017-01-19 DIAGNOSIS — S3210XA Unspecified fracture of sacrum, initial encounter for closed fracture: Secondary | ICD-10-CM | POA: Diagnosis not present

## 2017-01-27 ENCOUNTER — Telehealth: Payer: Self-pay | Admitting: Family Medicine

## 2017-01-27 NOTE — Telephone Encounter (Signed)
Pt daughter, Narda Rutherford is requesting a FL2 form and a TB skin test completed due to pt will be going to [facility] living. Please advise if pt will need an appointment? CB#[phone]-This is the # for pt daughter, Cyndi Lennert

## 2017-01-28 NOTE — Telephone Encounter (Signed)
Please advise 

## 2017-01-30 NOTE — Telephone Encounter (Signed)
Pt daughter Narda Rutherford called today and scheduled an appointment to see Dr Caryn Section 02/03/17@11 :53 to complete FL2 form and TB skin test/MW

## 2017-01-30 NOTE — Telephone Encounter (Signed)
FYI

## 2017-02-03 ENCOUNTER — Ambulatory Visit (INDEPENDENT_AMBULATORY_CARE_PROVIDER_SITE_OTHER): Payer: Medicare Other | Admitting: Family Medicine

## 2017-02-03 ENCOUNTER — Encounter: Payer: Self-pay | Admitting: Family Medicine

## 2017-02-03 VITALS — BP 98/62 | HR 89 | Temp 98.8°F | Resp 16 | Wt 146.0 lb

## 2017-02-03 DIAGNOSIS — E7800 Pure hypercholesterolemia, unspecified: Secondary | ICD-10-CM

## 2017-02-03 DIAGNOSIS — Z111 Encounter for screening for respiratory tuberculosis: Secondary | ICD-10-CM | POA: Diagnosis not present

## 2017-02-03 DIAGNOSIS — G20 Parkinson's disease: Secondary | ICD-10-CM

## 2017-02-03 DIAGNOSIS — I482 Chronic atrial fibrillation: Secondary | ICD-10-CM | POA: Diagnosis not present

## 2017-02-03 DIAGNOSIS — I10 Essential (primary) hypertension: Secondary | ICD-10-CM | POA: Diagnosis not present

## 2017-02-03 DIAGNOSIS — M810 Age-related osteoporosis without current pathological fracture: Secondary | ICD-10-CM | POA: Diagnosis not present

## 2017-02-03 DIAGNOSIS — I4821 Permanent atrial fibrillation: Secondary | ICD-10-CM

## 2017-02-03 NOTE — Progress Notes (Signed)
Patient: Whitney Hoffman Female    DOB: 04-29-1924   83 y.o.   MRN: [id number] Visit Date: 02/03/2017  Today's Provider: Lelon Huh, MD   Chief Complaint  Patient presents with  . Follow-up   Subjective:    HPI Follow up:  Patient is here today for follow up of multiple chronic conditions and an evaluation for placement at [facility] facility. She feels fairly well. She is having more trouble controlling bladder despite continued use of oxybutynin. She felt this worked well initially but doesn't seem to be helping anymore. She is now on Xarelto prescribed by Dr. Rockey Situ for atrial fibrillation. She is tolerating this well without adverse effects. She sprained her left ankle about a month ago and is still having trouble with it swelling as the day goes by. She was wearing an ankle wrap but stopped as it did not seem to help with the swelling.     Allergies  Allergen Reactions  . Aspirin     Sick on stomach  . Codeine     Feel dizzy  . Diazepam     Sick on stomach  . Meperidine Hcl     Sick on stomach   . Morphine     Nausea/sick on stomach  . Propoxyphene      Current Outpatient Prescriptions:  .  acetaminophen (TYLENOL 8 HOUR) 650 MG CR tablet, Take 1 tablet (650 mg total) by mouth every 8 (eight) hours as needed., Disp: 90 tablet, Rfl: 0 .  Cholecalciferol (HM VITAMIN D3) 4000 UNITS CAPS, Take 2,000 Units by mouth daily. , Disp: , Rfl:  .  Cyanocobalamin (B-12 PO), Take by mouth daily., Disp: , Rfl:  .  ezetimibe (ZETIA) 10 MG tablet, Take 1 tablet (10 mg total) by mouth daily., Disp: 30 tablet, Rfl: 5 .  furosemide (LASIX) 20 MG tablet, Take 1 tablet (20 mg total) by mouth daily., Disp: 30 tablet, Rfl: 0 .  meclizine (ANTIVERT) 25 MG tablet, Take 25 mg by mouth 3 (three) times daily as needed.  , Disp: , Rfl:  .  metoprolol (LOPRESSOR) 50 MG tablet, TAKE 2 TABLETS BY MOUTH ONCE EVERY MORNING AND TAKE 1 TABLET BY MOUTHEVERYEVENING, Disp: 90 tablet,  Rfl: 3 .  oxybutynin (DITROPAN-XL) 5 MG 24 hr tablet, TAKE 1 TABLET BY MOUTH ONCE DAILY., Disp: 30 tablet, Rfl: 12 .  pyridoxine (B-6) 200 MG tablet, Take 200 mg by mouth daily., Disp: , Rfl:  .  Rivaroxaban (XARELTO) 15 MG TABS tablet, Take 1 tablet (15 mg total) by mouth daily with supper., Disp: 30 tablet, Rfl: 6  Review of Systems  Constitutional: Negative for appetite change, chills, fatigue and fever.  Respiratory: Negative for chest tightness and shortness of breath.   Cardiovascular: Negative for chest pain and palpitations.  Gastrointestinal: Negative for abdominal pain, nausea and vomiting.  Neurological: Positive for weakness. Negative for dizziness.    Social History  Substance Use Topics  . Smoking status: Never Smoker  . Smokeless tobacco: Never Used     Comment: tobacco use- no   . Alcohol use No   Objective:   BP 98/62 (BP Location: Left Arm, Patient Position: Sitting, Cuff Size: Large)   Pulse 89   Temp 98.8 F (37.1 C) (Oral)   Resp 16   Wt 146 lb (66.2 kg)   SpO2 97% Comment: room air  BMI 27.59 kg/m  Vitals:   02/03/17 1131  Pulse: 89  Resp: 16  Temp: 98.8 F (37.1 C)  TempSrc: Oral  Weight: 146 lb (66.2 kg)     Physical Exam   General Appearance:    Alert, cooperative, no distress  Eyes:    PERRL, conjunctiva/corneas clear, EOM's intact       Lungs:     Clear to auscultation bilaterally, respirations unlabored  Heart:     Irregularly irregular rhythm. Normal rate   Neurologic:   Awake, alert, oriented x 3. No apparent focal neurological           defect.   MS:   Moderate swelling around left ankle. No erythema. Slightly tender to touch. FROM.        Assessment & Plan:     1. Screening-pulmonary TB  - Quantiferon tb gold assay (blood)  2. Essential hypertension Well controlled.  Continue current medications.    3. Permanent atrial fibrillation (HCC) Doing well on Xarelto, managed by Dr. Rockey Situ.   4. Parkinson's disease  (Whitney Hoffman) Stable. Continue routine neurology follow up .   5. Senile osteoporosis   6. Pure hypercholesterolemia Doing well with zetia. Check lipids at follow up since she is not fasting today and needs Tb screening done.  Reviewed patient history and medications and completed FL-2 form today.        Whitney Huh, MD  [facility]

## 2017-02-06 ENCOUNTER — Telehealth: Payer: Self-pay

## 2017-02-06 NOTE — Telephone Encounter (Signed)
Tramadol is one every six hours as needed for pain Milk thistle is one every day Metoprolol is metoprolol tartate

## 2017-02-06 NOTE — Telephone Encounter (Signed)
Called and advised receptionist as states below.

## 2017-02-06 NOTE — Telephone Encounter (Signed)
Levada Dy from [facility] called stating that patient is scheduled to move in tomorrow. She needs clarification of the following medications that are on FL2 forms:  Tramadol 50mg  - needs to know directions  (I believe it is 1 tablet every 6 hours prn pain per outside reconciled medications)  Metoprolol- needs to know if it is succinate or tartrate (I believe she takes Succinate per outside reconciled medications)  Milk thistle- needs directions (I was unable to find any prescribing information on this medication)   Per Levada Dy, patient will not be able to move in until she has this information. Call back is [phone]) [phone].

## 2017-02-08 LAB — QUANTIFERON IN TUBE
QFT TB AG MINUS NIL VALUE: <0 IU/mL
QUANTIFERON TB AG VALUE: 0.02 IU/mL
QUANTIFERON TB GOLD: NEGATIVE
Quantiferon Nil Value: 0.06 IU/mL

## 2017-02-08 LAB — QUANTIFERON TB GOLD ASSAY (BLOOD)

## 2017-02-17 ENCOUNTER — Telehealth: Payer: Self-pay | Admitting: Family Medicine

## 2017-02-17 NOTE — Telephone Encounter (Signed)
Tb test result faxed over-aa

## 2017-02-17 NOTE — Telephone Encounter (Signed)
Angie with [facility] called needing a copy of Mrs. Solan TB test, All the info available for the state.   Please fax to [phone]  Phone is [phone]  Thanks Con Memos

## 2017-02-21 ENCOUNTER — Ambulatory Visit (INDEPENDENT_AMBULATORY_CARE_PROVIDER_SITE_OTHER): Payer: Medicare Other | Admitting: *Deleted

## 2017-02-21 ENCOUNTER — Telehealth: Payer: Self-pay | Admitting: Cardiology

## 2017-02-21 DIAGNOSIS — I495 Sick sinus syndrome: Secondary | ICD-10-CM

## 2017-02-21 LAB — CUP PACEART REMOTE DEVICE CHECK
Battery Impedance: 828 Ohm
Battery Voltage: 2.78 V
Brady Statistic RV Percent Paced: 20 %
Date Time Interrogation Session: 20180703162907
Implantable Lead Implant Date: 20111108
Implantable Lead Location: 753860
Lead Channel Impedance Value: 532 Ohm
Lead Channel Impedance Value: 67 Ohm
Lead Channel Pacing Threshold Pulse Width: 0.4 ms
Lead Channel Setting Pacing Amplitude: 2.5 V
Lead Channel Setting Pacing Pulse Width: 0.4 ms
MDC IDC LEAD IMPLANT DT: 20111108
MDC IDC LEAD LOCATION: 753859
MDC IDC MSMT BATTERY REMAINING LONGEVITY: 73 mo
MDC IDC MSMT LEADCHNL RV PACING THRESHOLD AMPLITUDE: 0.875 V
MDC IDC MSMT LEADCHNL RV SENSING INTR AMPL: 11.2 mV
MDC IDC PG IMPLANT DT: 20111108
MDC IDC SET LEADCHNL RV SENSING SENSITIVITY: 4 mV

## 2017-02-21 NOTE — Telephone Encounter (Signed)
LMOVM reminding pt to send remote transmission.   

## 2017-02-23 ENCOUNTER — Telehealth: Payer: Self-pay | Admitting: Emergency Medicine

## 2017-02-23 NOTE — Telephone Encounter (Signed)
aa

## 2017-02-23 NOTE — Telephone Encounter (Signed)
Fax number- [phone]

## 2017-02-23 NOTE — Telephone Encounter (Signed)
Please review-aa 

## 2017-02-23 NOTE — Telephone Encounter (Signed)
Order printed and ready to fax.

## 2017-02-23 NOTE — Progress Notes (Signed)
Remote pacemaker transmission.   

## 2017-02-23 NOTE — Telephone Encounter (Signed)
Daughter call wanting [facility] needs an order for pt to be able to have imodium PRN because she gets bouts of diarrhea and constipation. She will need this faxed to [facility]. She is going to call back with the fax number when she gets to the facility. Please advise.

## 2017-02-24 ENCOUNTER — Encounter: Payer: Self-pay | Admitting: Cardiology

## 2017-03-02 DIAGNOSIS — S3210XA Unspecified fracture of sacrum, initial encounter for closed fracture: Secondary | ICD-10-CM | POA: Diagnosis not present

## 2017-03-03 ENCOUNTER — Ambulatory Visit (INDEPENDENT_AMBULATORY_CARE_PROVIDER_SITE_OTHER): Payer: Medicare Other | Admitting: Family Medicine

## 2017-03-03 VITALS — BP 110/88 | HR 80 | Temp 98.8°F | Resp 14 | Wt 147.0 lb

## 2017-03-03 DIAGNOSIS — E7800 Pure hypercholesterolemia, unspecified: Secondary | ICD-10-CM

## 2017-03-03 DIAGNOSIS — K5900 Constipation, unspecified: Secondary | ICD-10-CM

## 2017-03-03 DIAGNOSIS — I10 Essential (primary) hypertension: Secondary | ICD-10-CM

## 2017-03-03 MED ORDER — DOCUSATE SODIUM 100 MG PO CAPS: ORAL_CAPSULE | ORAL | 12 refills | Status: DC

## 2017-03-03 MED ORDER — LOPERAMIDE HCL 2 MG PO TABS: ORAL_TABLET | ORAL | 12 refills | Status: DC

## 2017-03-03 NOTE — Progress Notes (Signed)
Patient: Whitney Hoffman Female    DOB: [birth year]   83 y.o.   MRN: [id number] Visit Date: 03/03/2017  Today's Provider: Lelon Huh, MD   Chief Complaint  Patient presents with  . Hyperlipidemia   Subjective:    HPI  Patient is here for follow up on hyperlipidemia. Last lipid check was on 12/02/15. Patient is taking Zetia daily. Patient is tolerating Zetia much better then when she was taking statins.  Lab Results  Component Value Date   CHOL 160 12/02/2015   HDL 58 12/02/2015   LDLCALC 80 12/02/2015   TRIG 110 12/02/2015   CHOLHDL 2.8 12/02/2015   Patient would like to discuss having Imodium prescription on hand to use as needed. Patient has been having issues with diarrhea off and on and especially since she has been getting her stool softener from the nurse at [facility] where patient lives at 2 tablets in the morning or just 1 tablet in the morning daily instead of how she use to take it 1 tablet twice daily. Wt Readings from Last 3 Encounters:  03/03/17 147 lb (66.7 kg)  02/03/17 146 lb (66.2 kg)  01/11/17 145 lb (65.8 kg)   BP Readings from Last 3 Encounters:  03/03/17 110/88  02/03/17 98/62  01/11/17 (!) 148/92       Allergies  Allergen Reactions  . Aspirin     Sick on stomach  . Codeine     Feel dizzy  . Diazepam     Sick on stomach  . Meperidine Hcl     Sick on stomach   . Morphine     Nausea/sick on stomach  . Propoxyphene      Current Outpatient Prescriptions:  .  acetaminophen (TYLENOL 8 HOUR) 650 MG CR tablet, Take 1 tablet (650 mg total) by mouth every 8 (eight) hours as needed., Disp: 90 tablet, Rfl: 0 .  Cholecalciferol (HM VITAMIN D3) 4000 UNITS CAPS, Take 2,000 Units by mouth daily. , Disp: , Rfl:  .  Cyanocobalamin (B-12 PO), Take by mouth daily., Disp: , Rfl:  .  ezetimibe (ZETIA) 10 MG tablet, Take 1 tablet (10 mg total) by mouth daily., Disp: 30 tablet, Rfl: 5 .  furosemide (LASIX) 20 MG tablet, Take 1 tablet (20 mg total)  by mouth daily., Disp: 30 tablet, Rfl: 0 .  meclizine (ANTIVERT) 25 MG tablet, Take 25 mg by mouth 3 (three) times daily as needed.  , Disp: , Rfl:  .  metoprolol (LOPRESSOR) 50 MG tablet, TAKE 2 TABLETS BY MOUTH ONCE EVERY MORNING AND TAKE 1 TABLET BY MOUTHEVERYEVENING, Disp: 90 tablet, Rfl: 3 .  oxybutynin (DITROPAN-XL) 5 MG 24 hr tablet, TAKE 1 TABLET BY MOUTH ONCE DAILY., Disp: 30 tablet, Rfl: 12 .  pyridoxine (B-6) 200 MG tablet, Take 200 mg by mouth daily., Disp: , Rfl:  .  Rivaroxaban (XARELTO) 15 MG TABS tablet, Take 1 tablet (15 mg total) by mouth daily with supper., Disp: 30 tablet, Rfl: 6  Review of Systems  Constitutional: Positive for fatigue.  Respiratory: Negative.   Cardiovascular: Negative.   Gastrointestinal: Positive for abdominal pain and diarrhea.  Musculoskeletal: Positive for back pain and gait problem.  Neurological: Positive for dizziness, tremors and weakness.    Social History  Substance Use Topics  . Smoking status: Never Smoker  . Smokeless tobacco: Never Used     Comment: tobacco use- no   . Alcohol use No   Objective:   BP  110/88   Pulse 80   Temp 98.8 F (37.1 C)   Resp 14   Wt 147 lb (66.7 kg)   BMI 27.78 kg/m  Vitals:   03/03/17 1445  BP: 110/88  Pulse: 80  Resp: 14  Temp: 98.8 F (37.1 C)  Weight: 147 lb (66.7 kg)     Physical Exam   General Appearance:    Alert, cooperative, no distress  Eyes:    PERRL, conjunctiva/corneas clear, EOM's intact       Lungs:     Clear to auscultation bilaterally, respirations unlabored  Heart:    Regular rate and rhythm  Neurologic:   Awake, alert, oriented x 3. No apparent focal neurological           defect.           Assessment & Plan:     1. Pure hypercholesterolemia Tolerating Zetia much better than statins.  - Lipid panel - CBC - Comprehensive metabolic panel  2. Essential hypertension Well controlled.  Continue current medications.    3. Constipation, unspecified constipation  type Change stool softener to BID instead of 2 QD.        Lelon Huh, MD  [facility]

## 2017-03-06 DIAGNOSIS — E7800 Pure hypercholesterolemia, unspecified: Secondary | ICD-10-CM | POA: Diagnosis not present

## 2017-03-07 LAB — CBC
HEMOGLOBIN: 13.6 g/dL (ref 11.1–15.9)
Hematocrit: 43.7 % (ref 34.0–46.6)
MCH: 29.6 pg (ref 26.6–33.0)
MCHC: 31.1 g/dL — AB (ref 31.5–35.7)
MCV: 95 fL (ref 79–97)
Platelets: 281 10*3/uL (ref 150–379)
RBC: 4.6 x10E6/uL (ref 3.77–5.28)
RDW: 13.8 % (ref 12.3–15.4)
WBC: 6.5 10*3/uL (ref 3.4–10.8)

## 2017-03-07 LAB — COMPREHENSIVE METABOLIC PANEL
ALK PHOS: 86 IU/L (ref 39–117)
ALT: 10 IU/L (ref 0–32)
AST: 17 IU/L (ref 0–40)
Albumin/Globulin Ratio: 1.9 (ref 1.2–2.2)
Albumin: 4.2 g/dL (ref 3.2–4.6)
BILIRUBIN TOTAL: 0.6 mg/dL (ref 0.0–1.2)
BUN/Creatinine Ratio: 28 (ref 12–28)
BUN: 24 mg/dL (ref 10–36)
CHLORIDE: 99 mmol/L (ref 96–106)
CO2: 28 mmol/L (ref 20–29)
Calcium: 9 mg/dL (ref 8.7–10.3)
Creatinine, Ser: 0.87 mg/dL (ref 0.57–1.00)
GFR calc Af Amer: 67 mL/min/{1.73_m2} (ref >59)
GFR calc non Af Amer: 58 mL/min/{1.73_m2} — ABNORMAL LOW (ref >59)
GLUCOSE: 93 mg/dL (ref 65–99)
Globulin, Total: 2.2 g/dL (ref 1.5–4.5)
Potassium: 4.4 mmol/L (ref 3.5–5.2)
SODIUM: 141 mmol/L (ref 134–144)
Total Protein: 6.4 g/dL (ref 6.0–8.5)

## 2017-03-07 LAB — LIPID PANEL
CHOLESTEROL TOTAL: 151 mg/dL (ref 100–199)
Chol/HDL Ratio: 3 ratio (ref 0.0–4.4)
HDL: 51 mg/dL (ref >39)
LDL CALC: 79 mg/dL (ref 0–99)
TRIGLYCERIDES: 105 mg/dL (ref 0–149)
VLDL CHOLESTEROL CAL: 21 mg/dL (ref 5–40)

## 2017-03-09 ENCOUNTER — Telehealth: Payer: Self-pay | Admitting: Family Medicine

## 2017-03-09 NOTE — Telephone Encounter (Signed)
Please review

## 2017-03-09 NOTE — Telephone Encounter (Signed)
Pt daughter Whitney Hoffman is requesting a written order for pt to have Aleve as needed every 6 hours for pain.  Faxed to [facility] @ [phone].  TG#[phone]

## 2017-03-10 NOTE — Telephone Encounter (Signed)
Order faxed.

## 2017-03-10 NOTE — Telephone Encounter (Signed)
Order written and ready to be faxed.

## 2017-04-12 ENCOUNTER — Telehealth: Payer: Self-pay

## 2017-04-12 DIAGNOSIS — R32 Unspecified urinary incontinence: Secondary | ICD-10-CM

## 2017-04-12 NOTE — Telephone Encounter (Signed)
Patients daughter Narda Rutherford called requesting referral for Urology for patient. Narda Rutherford states that patient has always had bladder issues and has been wearing a pad to help protect from leakage but not has started to soak through the pads. Daughter states that patient has been experiencing more frequent episodes of urination and has been afraid to do certain activities because she fears having a accident. Daughter request a call back when available. KW

## 2017-04-25 DIAGNOSIS — H3554 Dystrophies primarily involving the retinal pigment epithelium: Secondary | ICD-10-CM | POA: Diagnosis not present

## 2017-04-26 NOTE — Progress Notes (Signed)
04/28/2017 11:01 AM   Whitney Hoffman 1923/08/29 [id number]  Referring provider: Birdie Sons, MD [address] [place],  [address]  Chief Complaint  Patient presents with  . New Patient (Initial Visit)    Urinary incontinence referred by Dr. Caryn Section    HPI: Patient is a 83 -year-old Caucasian female who is referred to [place] by, Dr. Lelon Huh, for urinary incontinence.  Patient states that she has had urinary incontinence for several years ago, then 6 months ago it has gotten more severe.  Patient has incontinence with standing, SUI and UI.   She is experiencing every two hours incontinent episodes during the day.   She is experiencing every two hours incontinent episodes during the night.  Her incontinence volume is large.   She is wearing 3 to 4 pads/depends daily.    She is having associated urinary frequency, urgency, dysuria, nocturia and intermittency.   Her PVR is 115 mL.  She does not have a history of urinary tract infections, STI's or injury to the bladder.   She denies dysuria, gross hematuria, suprapubic pain, back pain, abdominal pain or flank pain.  She has not had any recent fevers, chills, nausea or vomiting.    She had a bladder tack in 1967, but she does not have a history of nephrolithiasis or GU trauma.   She is post menopausal.   She admits to constipation and/or diarrhea.   She is having/ not having pain with bladder filling.    She has not had any recent imaging studies.    She is not drinking a lot of water daily.   She is drinking one caffeinated beverages daily.  She is not drinking alcoholic beverages daily.    Her risk factors for incontinence are vaginal delivery, a family history of incontinence, age, caffeine, depression, fecal incontinence, vaginal atrophy, pelvic surgery and/or neurological disorders.   She is taking antihistamines, opioids and OAB medication.  Reviewed referral notes - has had urinary incontinence  for several years, but has worsened recently and now patient is afraid to do some activities - currently on oxybutynin   PMH: Past Medical History:  Diagnosis Date  . Atrial fibrillation ([place])   . Parkinson disease ([place])   . Tachy-brady syndrome ([place])    with medtronic dual chambe rPCM    Surgical History: Past Surgical History:  Procedure Laterality Date  . ABDOMINAL HYSTERECTOMY  1966   fibroids ovaries removed secondary to ovarian cyst  . APPENDECTOMY  1944  . baldder tack     with her hysterectomy  . BREAST BIOPSY  2010   left benign repeat ultrasound recommneded in 2011 [facility] [place]    . CHOLECYSTECTOMY  1969  . GALLBLADDER SURGERY    . HIP SURGERY Left    ORIF x 2; post op infection Left hip  . PACEMAKER PLACEMENT  06/29/2010  . WRIST SURGERY  1996   Traumatic B    Home Medications:  Allergies as of 04/28/2017      Reactions   Aspirin    Sick on stomach   Codeine    Feel dizzy   Diazepam    Sick on stomach   Meperidine Hcl    Sick on stomach    Morphine    Nausea/sick on stomach   Propoxyphene       Medication List       Accurate as of 04/28/17 11:01 AM. Always use your most recent  med list.          acetaminophen 650 MG CR tablet Commonly known as:  TYLENOL 8 HOUR Take 1 tablet (650 mg total) by mouth every 8 (eight) hours as needed.   azithromycin 250 MG tablet Commonly known as:  ZITHROMAX azithromycin 250 mg tablet   B-12 PO Take by mouth daily.   cephALEXin 500 MG capsule Commonly known as:  KEFLEX cephalexin 500 mg capsule   cetirizine 10 MG chewable tablet Commonly known as:  ZYRTEC Chew 10 mg by mouth daily.   ciprofloxacin 500 MG tablet Commonly known as:  CIPRO ciprofloxacin 500 mg tablet   docusate sodium 100 MG capsule Commonly known as:  COLACE One capsule every morning and one capsule every evening   docusate sodium 100 MG capsule Commonly known as:  COLACE One capsule every morning and one  capsule every evening   ELIQUIS 5 MG Tabs tablet Generic drug:  apixaban Eliquis 5 mg tablet   ezetimibe 10 MG tablet Commonly known as:  ZETIA Take 1 tablet (10 mg total) by mouth daily.   fluorouracil 5 % cream Commonly known as:  EFUDEX fluorouracil 5 % topical cream   furosemide 20 MG tablet Commonly known as:  LASIX Take 1 tablet (20 mg total) by mouth daily.   HM VITAMIN D3 4000 units Caps Generic drug:  Cholecalciferol Take 2,000 Units by mouth daily.   loperamide 2 MG tablet Commonly known as:  IMODIUM A-D One tablet by mouth as needed for diarhea up to 3 tablets in 24 hours   losartan 100 MG tablet Commonly known as:  COZAAR losartan 100 mg tablet   magnesium hydroxide 400 MG/5ML suspension Commonly known as:  MILK OF MAGNESIA Take by mouth daily as needed for mild constipation.   meclizine 25 MG tablet Commonly known as:  ANTIVERT Take 25 mg by mouth 3 (three) times daily as needed.   metoprolol succinate 50 MG 24 hr tablet Commonly known as:  TOPROL-XL metoprolol succinate ER 50 mg tablet,extended release 24 hr   metoprolol tartrate 50 MG tablet Commonly known as:  LOPRESSOR TAKE 2 TABLETS BY MOUTH ONCE EVERY MORNING AND TAKE 1 TABLET BY MOUTHEVERYEVENING   Milk Thistle 175 MG Caps Take by mouth.   NEUPRO 8 MG/24HR Pt24 Generic drug:  Rotigotine Neupro 8 mg/24 hour transdermal 24 hour patch   NITROFURANTOIN PO nitrofurantoin monohydrate/macrocrystals 100 mg capsule   oxybutynin 5 MG 24 hr tablet Commonly known as:  DITROPAN-XL TAKE 1 TABLET BY MOUTH ONCE DAILY.   PRADAXA 75 MG Caps capsule Generic drug:  dabigatran Pradaxa 75 mg capsule   pyridoxine 200 MG tablet Commonly known as:  B-6 Take 200 mg by mouth daily.   Rivaroxaban 15 MG Tabs tablet Commonly known as:  XARELTO Take 1 tablet (15 mg total) by mouth daily with supper.   traMADol 50 MG tablet Commonly known as:  ULTRAM tramadol 50 mg tablet   Vitamin B-12 5000 MCG  Subl Place under the tongue.            Discharge Care Instructions        Start     Ordered   04/28/17 0000  BLADDER SCAN AMB NON-IMAGING     04/28/17 1017      Allergies:  Allergies  Allergen Reactions  . Aspirin     Sick on stomach  . Codeine     Feel dizzy  . Diazepam     Sick on stomach  . Meperidine  Hcl     Sick on stomach   . Morphine     Nausea/sick on stomach  . Propoxyphene     Family History: Family History  Problem Relation Age of Onset  . Diabetes Mother   . Coronary artery disease Father   . Heart attack Father   . Alzheimer's disease Father   . Kidney cancer Neg Hx   . Bladder Cancer Neg Hx     Social History:  reports that she has never smoked. She has never used smokeless tobacco. She reports that she does not drink alcohol or use drugs.  ROS: UROLOGY Frequent Urination?: Yes Hard to postpone urination?: Yes Burning/pain with urination?: Yes Get up at night to urinate?: Yes Leakage of urine?: Yes Urine stream starts and stops?: Yes Trouble starting stream?: No Do you have to strain to urinate?: No Blood in urine?: No Urinary tract infection?: No Sexually transmitted disease?: No Injury to kidneys or bladder?: No Painful intercourse?: No Weak stream?: No Currently pregnant?: No Vaginal bleeding?: No Last menstrual period?: n  Gastrointestinal Nausea?: No Vomiting?: No Indigestion/heartburn?: No Diarrhea?: Yes Constipation?: Yes  Constitutional Fever: No Night sweats?: No Weight loss?: No Fatigue?: No  Skin Skin rash/lesions?: No Itching?: No  Eyes Blurred vision?: Yes Double vision?: Yes  Ears/Nose/Throat Sore throat?: No Sinus problems?: Yes  Hematologic/Lymphatic Swollen glands?: No Easy bruising?: Yes  Cardiovascular Leg swelling?: Yes Chest pain?: No  Respiratory Cough?: No Shortness of breath?: No  Endocrine Excessive thirst?: No  Musculoskeletal Back pain?: Yes Joint pain?:  Yes  Neurological Headaches?: No Dizziness?: Yes  Psychologic Depression?: No Anxiety?: No  Physical Exam: BP (!) 141/77   Pulse 78   Ht 5\' 1"  (1.549 m)   Wt 147 lb (66.7 kg)   BMI 27.78 kg/m   Constitutional: Well nourished. Alert and oriented, No acute distress. HEENT: [place] AT, moist mucus membranes. Trachea midline, no masses. Cardiovascular: No clubbing, cyanosis, or edema. Respiratory: Normal respiratory effort, no increased work of breathing. GI: Abdomen is soft, non tender, non distended, no abdominal masses. Liver and spleen not palpable.  No hernias appreciated.  Stool sample for occult testing is not indicated.   GU: No CVA tenderness.  No bladder fullness or masses.  Atrophic external genitalia, normal pubic hair distribution, no lesions.  Normal urethral meatus, no lesions, no prolapse, no discharge.   No urethral masses, tenderness and/or tenderness. No bladder fullness, tenderness or masses. Pale vagina mucosa, poor estrogen effect, no discharge, no lesions, good pelvic support, no cystocele or rectocele noted.  Cervix, uterus and adnexa are surgically absent.  Anus and perineum are without rashes or lesions.   Skin: No rashes, bruises or suspicious lesions. Lymph: No cervical or inguinal adenopathy. Neurologic: Grossly intact, no focal deficits, moving all 4 extremities. Psychiatric: Normal mood and affect.  Laboratory Data: Lab Results  Component Value Date   WBC 6.5 03/06/2017   HGB 13.6 03/06/2017   HCT 43.7 03/06/2017   MCV 95 03/06/2017   PLT 281 03/06/2017    Lab Results  Component Value Date   CREATININE 0.87 03/06/2017       Component Value Date/Time   CHOL 151 03/06/2017 0922   HDL 51 03/06/2017 0922   CHOLHDL 3.0 03/06/2017 0922   LDLCALC 79 03/06/2017 0922    Lab Results  Component Value Date   AST 17 03/06/2017   Lab Results  Component Value Date   ALT 10 03/06/2017    I have reviewed the labs.  Pertinent Imaging: Results for  Whitney, Hoffman (MRN [id number]) as of 05/05/2017 11:03  Ref. Range 04/28/2017 10:32  Scan Result Unknown 115   I have independently reviewed the films.    Assessment & Plan:    1. Mixed Incontinence  - offered behavioral therapies, bladder training, bladder control strategies and pelvic floor muscle training - patient deferred  - fluid management - encouraged patient to increase her water intake  - offered medical therapy with beta-3 adrenergic receptor agonist and the potential side effects of each therapy - would like to try the beta-3 adrenergic receptor agonist (Myrbetriq).  Given Myrbetriq 25 mg samples, #28.  I have reviewed with the patient of the side effects of Myrbetriq, such as: elevation in BP, urinary retention and/or HA.    - hold oxybutynin  - RTC in 3 weeks for PVR and an OAB questionnaire  2. Vaginal atrophy  - Patient was given a sample of vaginal estrogen cream (Premarin) and instructed to apply 0.5mg  (pea-sized amount)  just inside the vaginal introitus with a finger-tip every night for two weeks and then Monday, Wednesday and Friday nights.  I explained to the patient that vaginally administered estrogen, which causes only a slight increase in the blood estrogen levels, have fewer contraindications and adverse systemic effects that oral HT.  - She will follow up in three months for an exam.     Return in about 3 weeks (around 05/19/2017) for PVR and OAB questionnaire.  These notes generated with voice recognition software. I apologize for typographical errors.  Zara Council, [facility] [address], [place], [place] [address] [phone]

## 2017-04-28 ENCOUNTER — Ambulatory Visit (INDEPENDENT_AMBULATORY_CARE_PROVIDER_SITE_OTHER): Payer: Medicare Other | Admitting: Urology

## 2017-04-28 ENCOUNTER — Encounter: Payer: Self-pay | Admitting: Urology

## 2017-04-28 VITALS — BP 141/77 | HR 78 | Ht 61.0 in | Wt 147.0 lb

## 2017-04-28 DIAGNOSIS — N3946 Mixed incontinence: Secondary | ICD-10-CM | POA: Diagnosis not present

## 2017-04-28 DIAGNOSIS — N952 Postmenopausal atrophic vaginitis: Secondary | ICD-10-CM

## 2017-04-28 LAB — BLADDER SCAN AMB NON-IMAGING: SCAN RESULT: 115

## 2017-05-10 NOTE — Progress Notes (Signed)
05/12/2017 11:30 AM   Whitney Hoffman 1923-10-01 [id number]  Referring provider: Birdie Sons, MD [address] [place], [place] [address]  Chief Complaint  Patient presents with  . Follow-up    3 weeks Mixed Incontinence    HPI: 83 yo WF with incontinence who presents today for a 3 week follow up after a trial of Myrbetriq 25 mg.  Background history Patient is a 70 -year-old Caucasian female who is referred to [place] by, Dr. Lelon Huh, for urinary incontinence.  Patient states that she has had urinary incontinence for several years ago, then 6 months ago it has gotten more severe.  Patient has incontinence with standing, SUI and UI.   She is experiencing every two hours incontinent episodes during the day.   She is experiencing every two hours incontinent episodes during the night.  Her incontinence volume is large.   She is wearing 3 to 4 pads/depends daily.  She is having associated urinary frequency, urgency, dysuria, nocturia and intermittency.   Her PVR is 115 mL.  She does not have a history of urinary tract infections, STI's or injury to the bladder.  She denies dysuria, gross hematuria, suprapubic pain, back pain, abdominal pain or flank pain.  She has not had any recent fevers, chills, nausea or vomiting.   She had a bladder tack in 1967, but she does not have a history of nephrolithiasis or GU trauma.  She is post menopausal.   She admits to constipation and/or diarrhea.   She is having/ not having pain with bladder filling.  She has not had any recent imaging studies.  She is not drinking a lot of water daily.   She is drinking one caffeinated beverages daily.  She is not drinking alcoholic beverages daily.  Her risk factors for incontinence are vaginal delivery, a family history of incontinence, age, caffeine, depression, fecal incontinence, vaginal atrophy, pelvic surgery and/or neurological disorders.   She is taking antihistamines, opioids and OAB medication.   Reviewed referral notes - has had urinary incontinence for several years, but has worsened recently and now patient is afraid to do some activities - currently on oxybutynin   Today, she is experiencing urgency x 8 or more, frequency x 8 or more, not restricting fluids to avoid visits to the restroom, is engaging in toilet mapping, incontinence x 8 or more and nocturia x 0-3.   Her PVR is 134 mL.   Her BP is 141/92.  She denies any dysuria, suprapubic pain or gross hematuria. She has not had any fevers, chills, nausea or vomiting.  She did feel the Myrbetriq 25 mg daily helped improve her urinary symptoms somewhat.    PMH: Past Medical History:  Diagnosis Date  . Atrial fibrillation ([place])   . Parkinson disease ([place])   . Tachy-brady syndrome ([place])    with medtronic dual chambe rPCM    Surgical History: Past Surgical History:  Procedure Laterality Date  . ABDOMINAL HYSTERECTOMY  1966   fibroids ovaries removed secondary to ovarian cyst  . APPENDECTOMY  1944  . baldder tack     with her hysterectomy  . BREAST BIOPSY  2010   left benign repeat ultrasound recommneded in 2011 [facility] [place]    . CHOLECYSTECTOMY  1969  . GALLBLADDER SURGERY    . HIP SURGERY Left    ORIF x 2; post op infection Left hip  . PACEMAKER PLACEMENT  06/29/2010  . WRIST SURGERY  1996   Traumatic B    Home Medications:  Allergies as of 05/12/2017      Reactions   Aspirin    Sick on stomach   Codeine    Feel dizzy   Diazepam    Sick on stomach   Meperidine Hcl    Sick on stomach    Morphine    Nausea/sick on stomach   Propoxyphene       Medication List       Accurate as of 05/12/17 11:30 AM. Always use your most recent med list.          acetaminophen 650 MG CR tablet Commonly known as:  TYLENOL 8 HOUR Take 1 tablet (650 mg total) by mouth every 8 (eight) hours as needed.   azithromycin 250 MG tablet Commonly known as:  ZITHROMAX azithromycin 250 mg tablet   B-12  PO Take by mouth daily.   cephALEXin 500 MG capsule Commonly known as:  KEFLEX cephalexin 500 mg capsule   cetirizine 10 MG chewable tablet Commonly known as:  ZYRTEC Chew 10 mg by mouth daily.   ciprofloxacin 500 MG tablet Commonly known as:  CIPRO ciprofloxacin 500 mg tablet   docusate sodium 100 MG capsule Commonly known as:  COLACE One capsule every morning and one capsule every evening   docusate sodium 100 MG capsule Commonly known as:  COLACE One capsule every morning and one capsule every evening   ELIQUIS 5 MG Tabs tablet Generic drug:  apixaban Eliquis 5 mg tablet   ezetimibe 10 MG tablet Commonly known as:  ZETIA Take 1 tablet (10 mg total) by mouth daily.   fluorouracil 5 % cream Commonly known as:  EFUDEX fluorouracil 5 % topical cream   furosemide 20 MG tablet Commonly known as:  LASIX Take 1 tablet (20 mg total) by mouth daily.   HM VITAMIN D3 4000 units Caps Generic drug:  Cholecalciferol Take 2,000 Units by mouth daily.   loperamide 2 MG tablet Commonly known as:  IMODIUM A-D One tablet by mouth as needed for diarhea up to 3 tablets in 24 hours   losartan 100 MG tablet Commonly known as:  COZAAR losartan 100 mg tablet   magnesium hydroxide 400 MG/5ML suspension Commonly known as:  MILK OF MAGNESIA Take by mouth daily as needed for mild constipation.   meclizine 25 MG tablet Commonly known as:  ANTIVERT Take 25 mg by mouth 3 (three) times daily as needed.   metoprolol succinate 50 MG 24 hr tablet Commonly known as:  TOPROL-XL metoprolol succinate ER 50 mg tablet,extended release 24 hr   metoprolol tartrate 50 MG tablet Commonly known as:  LOPRESSOR TAKE 2 TABLETS BY MOUTH ONCE EVERY MORNING AND TAKE 1 TABLET BY MOUTHEVERYEVENING   Milk Thistle 175 MG Caps Take by mouth.   mirabegron ER 25 MG Tb24 tablet Commonly known as:  MYRBETRIQ Take 1 tablet (25 mg total) by mouth daily.   NEUPRO 8 MG/24HR Pt24 Generic drug:   Rotigotine Neupro 8 mg/24 hour transdermal 24 hour patch   NITROFURANTOIN PO nitrofurantoin monohydrate/macrocrystals 100 mg capsule   oxybutynin 5 MG 24 hr tablet Commonly known as:  DITROPAN-XL TAKE 1 TABLET BY MOUTH ONCE DAILY.   PRADAXA 75 MG Caps capsule Generic drug:  dabigatran Pradaxa 75 mg capsule   pyridoxine 200 MG tablet Commonly known as:  B-6 Take 200 mg by mouth daily.   Rivaroxaban 15 MG Tabs tablet Commonly known as:  XARELTO Take 1 tablet (15 mg  total) by mouth daily with supper.   traMADol 50 MG tablet Commonly known as:  ULTRAM tramadol 50 mg tablet   Vitamin B-12 5000 MCG Subl Place under the tongue.            Discharge Care Instructions        Start     Ordered   05/12/17 0000  BLADDER SCAN AMB NON-IMAGING     05/12/17 1059   05/12/17 0000  mirabegron ER (MYRBETRIQ) 25 MG TB24 tablet  Daily    Question:  Supervising Provider  Answer:  Hollice Espy   05/12/17 1128      Allergies:  Allergies  Allergen Reactions  . Aspirin     Sick on stomach  . Codeine     Feel dizzy  . Diazepam     Sick on stomach  . Meperidine Hcl     Sick on stomach   . Morphine     Nausea/sick on stomach  . Propoxyphene     Family History: Family History  Problem Relation Age of Onset  . Diabetes Mother   . Coronary artery disease Father   . Heart attack Father   . Alzheimer's disease Father   . Kidney cancer Neg Hx   . Bladder Cancer Neg Hx     Social History:  reports that she has never smoked. She has never used smokeless tobacco. She reports that she does not drink alcohol or use drugs.  ROS: UROLOGY Frequent Urination?: Yes Hard to postpone urination?: Yes Burning/pain with urination?: Yes Get up at night to urinate?: Yes Leakage of urine?: Yes Urine stream starts and stops?: Yes Trouble starting stream?: No Do you have to strain to urinate?: No Blood in urine?: No Urinary tract infection?: No Sexually transmitted disease?:  No Injury to kidneys or bladder?: No Painful intercourse?: No Weak stream?: No Currently pregnant?: No Vaginal bleeding?: No Last menstrual period?: n  Gastrointestinal Nausea?: No Vomiting?: No Indigestion/heartburn?: No Diarrhea?: Yes Constipation?: Yes  Constitutional Fever: No Night sweats?: No Weight loss?: No Fatigue?: No  Skin Skin rash/lesions?: No Itching?: No  Eyes Blurred vision?: Yes Double vision?: Yes  Ears/Nose/Throat Sore throat?: No Sinus problems?: Yes  Hematologic/Lymphatic Swollen glands?: No Easy bruising?: Yes  Cardiovascular Leg swelling?: Yes Chest pain?: No  Respiratory Cough?: No Shortness of breath?: No  Endocrine Excessive thirst?: No  Musculoskeletal Back pain?: Yes Joint pain?: Yes  Neurological Headaches?: No Dizziness?: Yes  Psychologic Depression?: No Anxiety?: No  Physical Exam: BP (!) 141/92   Pulse 85   Ht 5\' 1"  (1.549 m)   Wt 147 lb (66.7 kg)   BMI 27.78 kg/m   Constitutional: Well nourished. Alert and oriented, No acute distress. HEENT: [place] AT, moist mucus membranes. Trachea midline, no masses. Cardiovascular: No clubbing, cyanosis, or edema. Respiratory: Normal respiratory effort, no increased work of breathing. Skin: No rashes, bruises or suspicious lesions. Lymph: No cervical or inguinal adenopathy. Neurologic: Grossly intact, no focal deficits, moving all 4 extremities. Psychiatric: Normal mood and affect.  Laboratory Data: Lab Results  Component Value Date   WBC 6.5 03/06/2017   HGB 13.6 03/06/2017   HCT 43.7 03/06/2017   MCV 95 03/06/2017   PLT 281 03/06/2017    Lab Results  Component Value Date   CREATININE 0.87 03/06/2017       Component Value Date/Time   CHOL 151 03/06/2017 0922   HDL 51 03/06/2017 0922   CHOLHDL 3.0 03/06/2017 0922   LDLCALC 79 03/06/2017 7412  Lab Results  Component Value Date   AST 17 03/06/2017   Lab Results  Component Value Date   ALT 10  03/06/2017    I have reviewed the labs.   Pertinent Imaging: Results for KELTY, SZAFRAN (MRN [id number]) as of 05/12/2017 11:58  Ref. Range 05/12/2017 11:18  Scan Result Unknown 134    I have independently reviewed the films.    Assessment & Plan:    1. Mixed Incontinence  - offered behavioral therapies, bladder training, bladder control strategies and pelvic floor muscle training - patient deferred  - fluid management - encouraged patient to increase her water intake  - continue the Myrbetriq for one more month  - hold oxybutynin  - RTC in one month for PVR and an OAB questionnaire  2. Vaginal atrophy  - continue to apply the cream three nights weekly  - She will follow up in three months for an exam.     Return in about 1 month (around 06/11/2017) for PVR and OAB questionnaire.  These notes generated with voice recognition software. I apologize for typographical errors.  Zara Council, [facility] [address], [place], [place] [address] [phone]

## 2017-05-12 ENCOUNTER — Ambulatory Visit (INDEPENDENT_AMBULATORY_CARE_PROVIDER_SITE_OTHER): Payer: Medicare Other | Admitting: Urology

## 2017-05-12 ENCOUNTER — Encounter: Payer: Self-pay | Admitting: Urology

## 2017-05-12 VITALS — BP 141/92 | HR 85 | Ht 61.0 in | Wt 147.0 lb

## 2017-05-12 DIAGNOSIS — N952 Postmenopausal atrophic vaginitis: Secondary | ICD-10-CM | POA: Diagnosis not present

## 2017-05-12 DIAGNOSIS — N3946 Mixed incontinence: Secondary | ICD-10-CM | POA: Diagnosis not present

## 2017-05-12 LAB — BLADDER SCAN AMB NON-IMAGING: SCAN RESULT: 134

## 2017-05-12 MED ORDER — MIRABEGRON ER 25 MG PO TB24: 25.0000 mg | ORAL_TABLET | Freq: Every day | ORAL | 11 refills | Status: DC

## 2017-05-17 ENCOUNTER — Telehealth: Payer: Self-pay | Admitting: Family Medicine

## 2017-05-18 ENCOUNTER — Telehealth: Payer: Self-pay | Admitting: Family Medicine

## 2017-05-18 ENCOUNTER — Other Ambulatory Visit: Payer: Self-pay | Admitting: Family Medicine

## 2017-05-18 NOTE — Telephone Encounter (Signed)
[place] with [facility] is requesting a written order for pt to have Miralax daily.  Fax [phone] and [phone]

## 2017-05-18 NOTE — Telephone Encounter (Signed)
Please review

## 2017-05-19 ENCOUNTER — Emergency Department: Payer: Medicare Other

## 2017-05-19 ENCOUNTER — Emergency Department
Admission: EM | Admit: 2017-05-19 | Discharge: 2017-05-19 | Disposition: A | Discharge status: Home / Self Care | Payer: Medicare Other | Attending: Emergency Medicine | Admitting: Emergency Medicine

## 2017-05-19 ENCOUNTER — Encounter: Payer: Self-pay | Admitting: Emergency Medicine

## 2017-05-19 ENCOUNTER — Telehealth: Payer: Self-pay

## 2017-05-19 DIAGNOSIS — Y939 Activity, unspecified: Secondary | ICD-10-CM | POA: Insufficient documentation

## 2017-05-19 DIAGNOSIS — G20 Parkinson's disease: Secondary | ICD-10-CM | POA: Diagnosis not present

## 2017-05-19 DIAGNOSIS — Z79899 Other long term (current) drug therapy: Secondary | ICD-10-CM | POA: Diagnosis not present

## 2017-05-19 DIAGNOSIS — X58XXXA Exposure to other specified factors, initial encounter: Secondary | ICD-10-CM | POA: Insufficient documentation

## 2017-05-19 DIAGNOSIS — S32030A Wedge compression fracture of third lumbar vertebra, initial encounter for closed fracture: Secondary | ICD-10-CM | POA: Diagnosis not present

## 2017-05-19 DIAGNOSIS — Y929 Unspecified place or not applicable: Secondary | ICD-10-CM | POA: Diagnosis not present

## 2017-05-19 DIAGNOSIS — Z950 Presence of cardiac pacemaker: Secondary | ICD-10-CM | POA: Diagnosis not present

## 2017-05-19 DIAGNOSIS — M4850XA Collapsed vertebra, not elsewhere classified, site unspecified, initial encounter for fracture: Secondary | ICD-10-CM | POA: Insufficient documentation

## 2017-05-19 DIAGNOSIS — I5032 Chronic diastolic (congestive) heart failure: Secondary | ICD-10-CM | POA: Insufficient documentation

## 2017-05-19 DIAGNOSIS — M545 Low back pain: Secondary | ICD-10-CM | POA: Diagnosis not present

## 2017-05-19 DIAGNOSIS — I110 Hypertensive heart disease with heart failure: Secondary | ICD-10-CM | POA: Diagnosis not present

## 2017-05-19 DIAGNOSIS — R109 Unspecified abdominal pain: Secondary | ICD-10-CM | POA: Diagnosis present

## 2017-05-19 DIAGNOSIS — R1030 Lower abdominal pain, unspecified: Secondary | ICD-10-CM | POA: Diagnosis not present

## 2017-05-19 DIAGNOSIS — K5641 Fecal impaction: Secondary | ICD-10-CM | POA: Insufficient documentation

## 2017-05-19 DIAGNOSIS — Y999 Unspecified external cause status: Secondary | ICD-10-CM | POA: Diagnosis not present

## 2017-05-19 DIAGNOSIS — Z85828 Personal history of other malignant neoplasm of skin: Secondary | ICD-10-CM | POA: Insufficient documentation

## 2017-05-19 DIAGNOSIS — K5900 Constipation, unspecified: Secondary | ICD-10-CM

## 2017-05-19 LAB — CBC
HCT: 41.4 % (ref 35.0–47.0)
Hemoglobin: 14 g/dL (ref 12.0–16.0)
MCH: 29.6 pg (ref 26.0–34.0)
MCHC: 33.8 g/dL (ref 32.0–36.0)
MCV: 87.3 fL (ref 80.0–100.0)
PLATELETS: 275 10*3/uL (ref 150–440)
RBC: 4.74 MIL/uL (ref 3.80–5.20)
RDW: 15.1 % — ABNORMAL HIGH (ref 11.5–14.5)
WBC: 8.9 10*3/uL (ref 3.6–11.0)

## 2017-05-19 LAB — BASIC METABOLIC PANEL
Anion gap: 9 (ref 5–15)
BUN: 22 mg/dL — AB (ref 6–20)
CO2: 29 mmol/L (ref 22–32)
CREATININE: 0.65 mg/dL (ref 0.44–1.00)
Calcium: 9.1 mg/dL (ref 8.9–10.3)
Chloride: 100 mmol/L — ABNORMAL LOW (ref 101–111)
Glucose, Bld: 123 mg/dL — ABNORMAL HIGH (ref 65–99)
POTASSIUM: 3.9 mmol/L (ref 3.5–5.1)
SODIUM: 138 mmol/L (ref 135–145)

## 2017-05-19 LAB — URINALYSIS, COMPLETE (UACMP) WITH MICROSCOPIC
BACTERIA UA: NONE SEEN
Bilirubin Urine: NEGATIVE
Glucose, UA: NEGATIVE mg/dL
KETONES UR: NEGATIVE mg/dL
Leukocytes, UA: NEGATIVE
Nitrite: NEGATIVE
PROTEIN: NEGATIVE mg/dL
SPECIFIC GRAVITY, URINE: 1.006 (ref 1.005–1.030)
pH: 7 (ref 5.0–8.0)

## 2017-05-19 MED ORDER — TRAMADOL HCL 50 MG PO TABS
100.0000 mg | ORAL_TABLET | Freq: Once | ORAL | Status: AC
  Administered 2017-05-19: 100 mg via ORAL
  Filled 2017-05-19: qty 2

## 2017-05-19 MED ORDER — IBUPROFEN 400 MG PO TABS
400.0000 mg | ORAL_TABLET | Freq: Once | ORAL | Status: AC
  Administered 2017-05-19: 400 mg via ORAL
  Filled 2017-05-19: qty 1

## 2017-05-19 MED ORDER — TRAMADOL HCL 50 MG PO TABS: 50.0000 mg | ORAL_TABLET | Freq: Four times a day (QID) | ORAL | 0 refills | Status: DC | PRN

## 2017-05-19 NOTE — ED Triage Notes (Signed)
Pt brought in by [facility] from [facility] for right lower back pain. Pt states that this started over a week ago. Pain is worse when she gets up and tris to move around. Pt states that she has also been having urinary frequency. EMS reports that pt has not had a bowel movement in about 6 days.

## 2017-05-19 NOTE — Telephone Encounter (Signed)
[place] from [facility] assisted living called to notify Dr. Caryn Section that they sent patient to the ER this morning due to low back pain on the right side, no bowel movement for the past 6 days. Patient is currently at the hospital.

## 2017-05-19 NOTE — ED Provider Notes (Signed)
[facility] Emergency Department Provider Note ____________________________________________   I have reviewed the triage vital signs and the triage nursing note.  HISTORY  Chief Complaint Flank Pain   Historian Patient and son and daughter in law  HPI Whitney Hoffman is a 83 y.o. female With history of A. fib and Parkinson's disease as well as degenerative disc disease, lives at assisted living, presents complaining of right-sided lower back pain that is worse upon standing for about a week. No known trauma or overuse injury. No history of kidney stones. States that she has urinary frequency and when she stands up she will sometimes just P herself. No bowel incontinence. She states she's actually been constipated and hasn't had a bowel movement in 6 days. She thinks she may be impacted.  Denies fever. Denies bloody urine.  Pain is 5 out of 10 at rest and severe. When standing.    Past Medical History:  Diagnosis Date  . Atrial fibrillation ([place])   . Parkinson disease ([place])   . Tachy-brady syndrome ([place])    with medtronic dual chambe rPCM    Patient Active Problem List   Diagnosis Date Noted  . Low back pain 11/30/2016  . Allergic rhinitis 06/15/2015  . Arthritis 06/15/2015  . History of colonic polyps 06/15/2015  . DDD (degenerative disc disease), lumbar 06/15/2015  . Fatigue 06/15/2015  . Irritable bladder 06/15/2015  . Left knee pain 06/15/2015  . Nevus 06/15/2015  . Parkinson's disease ([place]) 06/15/2015  . Peptic ulcer disease 06/15/2015  . Skin cancer of nose 06/15/2015  . Vertigo 06/15/2015  . Dyspnea on exertion 08/08/2013  . Chronic diastolic heart failure ([place]) 08/07/2012  . VENOUS HYPERTENSION, CHRONIC W/ INFLAMMATION 07/21/2010  . Essential hypertension 07/08/2010  . ATRIAL FIBRILLATION 07/08/2010  . PACEMAKER-Medtronic 07/08/2010  . Senile osteoporosis 10/18/2009  . Pure hypercholesterolemia 10/05/2009    Past Surgical History:   Procedure Laterality Date  . ABDOMINAL HYSTERECTOMY  1966   fibroids ovaries removed secondary to ovarian cyst  . APPENDECTOMY  1944  . baldder tack     with her hysterectomy  . BREAST BIOPSY  2010   left benign repeat ultrasound recommneded in 2011 [place] [place]    . CHOLECYSTECTOMY  1969  . GALLBLADDER SURGERY    . HIP SURGERY Left    ORIF x 2; post op infection Left hip  . PACEMAKER PLACEMENT  06/29/2010  . WRIST SURGERY  1996   Traumatic B    Prior to Admission medications   Medication Sig Start Date End Date Taking? Authorizing Provider  cetirizine (ZYRTEC) 10 MG chewable tablet Chew 10 mg by mouth daily.   Yes [provider]  Cholecalciferol (VITAMIN D-3) 5000 units TABS Take 1 tablet by mouth daily.   Yes [provider]  Cyanocobalamin (VITAMIN B-12) 5000 MCG SUBL Place under the tongue.   Yes [provider]  docusate sodium (COLACE) 100 MG capsule One capsule every morning and one capsule every evening 03/03/17  Yes Birdie Sons, MD  docusate sodium (COLACE) 100 MG capsule One capsule every morning and one capsule every evening 03/03/17  Yes Birdie Sons, MD  ezetimibe (ZETIA) 10 MG tablet Take 1 tablet (10 mg total) by mouth daily. 11/30/16  Yes Birdie Sons, MD  metoprolol (LOPRESSOR) 50 MG tablet TAKE 2 TABLETS BY MOUTH ONCE EVERY MORNING AND TAKE 1 TABLET BY MOUTHEVERYEVENING 12/01/16  Yes Deboraha Sprang, MD  Milk Thistle 175  MG CAPS Take by mouth.   Yes [provider]  mirabegron ER (MYRBETRIQ) 25 MG TB24 tablet Take 1 tablet (25 mg total) by mouth daily. 05/12/17  Yes McGowan, Hunt Oris, PA-C  Rivaroxaban (XARELTO) 15 MG TABS tablet Take 1 tablet (15 mg total) by mouth daily with supper. 12/01/16  Yes Deboraha Sprang, MD  acetaminophen (TYLENOL 8 HOUR) 650 MG CR tablet Take 1 tablet (650 mg total) by mouth every 8 (eight) hours as needed. Patient not taking: Reported on 05/12/2017 01/02/17   Lavonia Drafts, MD  furosemide (LASIX) 20 MG tablet Take 1 tablet (20 mg total) by mouth daily. Patient not taking: Reported on 04/28/2017 01/10/17   Birdie Sons, MD  loperamide (IMODIUM A-D) 2 MG tablet One tablet by mouth as needed for diarhea up to 3 tablets in 24 hours 03/03/17   Birdie Sons, MD  naproxen sodium (ANAPROX) 220 MG tablet Take 220 mg by mouth every 6 (six) hours as needed.    [provider]  oxybutynin (DITROPAN-XL) 5 MG 24 hr tablet TAKE 1 TABLET BY MOUTH ONCE DAILY. Patient not taking: Reported on 05/12/2017 09/03/16   Birdie Sons, MD  traMADol (ULTRAM) 50 MG tablet Take 1 tablet (50 mg total) by mouth every 6 (six) hours as needed. 05/19/17   Lisa Roca, MD  traMADol (ULTRAM) 50 MG tablet Take 50 mg by mouth every 6 (six) hours as needed.    [provider]    Allergies  Allergen Reactions  . Aspirin     Sick on stomach  . Codeine     Feel dizzy  . Diazepam     Sick on stomach  . Meperidine Hcl     Sick on stomach   . Morphine     Nausea/sick on stomach  . Propoxyphene     Family History  Problem Relation Age of Onset  . Diabetes Mother   . Coronary artery disease Father   . Heart attack Father   . Alzheimer's disease Father   . Kidney cancer Neg Hx   . Bladder Cancer Neg Hx     Social History Social History  Substance Use Topics  . Smoking status: Never Smoker  . Smokeless tobacco: Never Used     Comment: tobacco use- no   . Alcohol use No    Review of Systems  Constitutional: Negative for fever. Eyes: Negative for visual changes. ENT: Negative for sore throat. Cardiovascular: Negative for chest pain. Respiratory: Negative for shortness of breath. Gastrointestinal: Negative for abdominal pain, vomiting and diarrhea. Genitourinary: positivefor dysuria. Musculoskeletal: positivefor back pain. Skin: Negative for rash. Neurological: Negative for headache.  ____________________________________________   PHYSICAL  EXAM:  VITAL SIGNS: ED Triage Vitals  Enc Vitals Group     BP 05/19/17 1028 (!) 166/106     Pulse Rate 05/19/17 1028 96     Resp 05/19/17 1028 16     Temp 05/19/17 1028 98.5 F (36.9 C)     Temp Source 05/19/17 1028 Oral     SpO2 05/19/17 1028 96 %     Weight 05/19/17 1019 147 lb (66.7 kg)     Height --      Head Circumference --      [facility] --      Pain Score 05/19/17 1018 10     Pain Loc --      Pain Edu? --      Excl. in [place]? --  Constitutional: Alert and cooperative. Well appearing and in no distress. HEENT   Head: Normocephalic and atraumatic.      Eyes: Conjunctivae are normal. Pupils equal and round.       Ears:         Nose: No congestion/rhinnorhea.   Mouth/Throat: Mucous membranes are moist.   Neck: No stridor. Cardiovascular/Chest: Normal rate, regular rhythm.  No murmurs, rubs, or gallops. Respiratory: Normal respiratory effort without tachypnea nor retractions. Breath sounds are clear and equal bilaterally. No wheezes/rales/rhonchi. Gastrointestinal: Soft. No distention, no guarding, no rebound. Nontender.  Genitourinary/rectal:fecal impaction, brown stool. Musculoskeletal: Nontender with normal range of motion in all extremities. No joint effusions.  No lower extremity tenderness.  No edema. Neurologic:  Normal speech and language. No gross or focal neurologic deficits are appreciated. Skin:  Skin is warm, dry and intact. No rash noted. Psychiatric: Mood and affect are normal. Speech and behavior are normal. Patient exhibits appropriate insight and judgment.   ____________________________________________  LABS (pertinent positives/negatives) I, Lisa Roca, MD the attending physician have reviewed the labs noted below.  Labs Reviewed  URINALYSIS, COMPLETE (UACMP) WITH MICROSCOPIC - Abnormal; Notable for the following:       Result Value   Color, Urine YELLOW (*)    APPearance HAZY (*)    Hgb urine dipstick SMALL (*)    Squamous  Epithelial / LPF 0-5 (*)    All other components within normal limits  CBC - Abnormal; Notable for the following:    RDW 15.1 (*)    All other components within normal limits  BASIC METABOLIC PANEL - Abnormal; Notable for the following:    Chloride 100 (*)    Glucose, Bld 123 (*)    BUN 22 (*)    All other components within normal limits    ____________________________________________    EKG I, Lisa Roca, MD, the attending physician have personally viewed and interpreted all ECGs.  None ____________________________________________  RADIOLOGY All Xrays were viewed by me.  Imaging interpreted by Radiologist, and I, Lisa Roca, MD the attending physician have reviewed the radiologist interpretation noted below.  Lumbar spine xray :  IMPRESSION: L3 compression deformity. This is new from the prior exam of 25/95/6387 but of uncertain chronicity. Nonemergent MRI may be helpful for further evaluation. __________________________________________  PROCEDURES  Procedure(s) performed: fecal disimpaction. Rectal disimpaction performed by myself. Manual digital exam followed by disimpaction. Moderate amount of brown stool was removed.  Critical Care performed: None  ____________________________________________  No current facility-administered medications on file prior to encounter.    Current Outpatient Prescriptions on File Prior to Encounter  Medication Sig Dispense Refill  . cetirizine (ZYRTEC) 10 MG chewable tablet Chew 10 mg by mouth daily.    . Cyanocobalamin (VITAMIN B-12) 5000 MCG SUBL Place under the tongue.    . docusate sodium (COLACE) 100 MG capsule One capsule every morning and one capsule every evening 60 capsule 12  . docusate sodium (COLACE) 100 MG capsule One capsule every morning and one capsule every evening 60 capsule 12  . ezetimibe (ZETIA) 10 MG tablet Take 1 tablet (10 mg total) by mouth daily. 30 tablet 5  . metoprolol (LOPRESSOR) 50 MG tablet TAKE 2  TABLETS BY MOUTH ONCE EVERY MORNING AND TAKE 1 TABLET BY MOUTHEVERYEVENING 90 tablet 3  . Milk Thistle 175 MG CAPS Take by mouth.    . mirabegron ER (MYRBETRIQ) 25 MG TB24 tablet Take 1 tablet (25 mg total) by mouth daily. 30 tablet 11  .  Rivaroxaban (XARELTO) 15 MG TABS tablet Take 1 tablet (15 mg total) by mouth daily with supper. 30 tablet 6  . acetaminophen (TYLENOL 8 HOUR) 650 MG CR tablet Take 1 tablet (650 mg total) by mouth every 8 (eight) hours as needed. (Patient not taking: Reported on 05/12/2017) 90 tablet 0  . furosemide (LASIX) 20 MG tablet Take 1 tablet (20 mg total) by mouth daily. (Patient not taking: Reported on 04/28/2017) 30 tablet 0  . loperamide (IMODIUM A-D) 2 MG tablet One tablet by mouth as needed for diarhea up to 3 tablets in 24 hours 30 tablet 12  . oxybutynin (DITROPAN-XL) 5 MG 24 hr tablet TAKE 1 TABLET BY MOUTH ONCE DAILY. (Patient not taking: Reported on 05/12/2017) 30 tablet 12    ____________________________________________  ED COURSE / ASSESSMENT AND PLAN  Pertinent labs & imaging results that were available during my care of the patient were reviewed by me and considered in my medical decision making (see chart for details).    Ms. Murton was complaining of some right-sided low back pain, initially denied chronic back pain, but past medical history review of the chart shows degenerative disc disease as well as low back pain.  patient states she is tolerating tramadol the past and she was given Tylenol and IV prevent and states that her pain at rest went from 5 down to 0.  Urinalysis and laboratory studies are overall reassuring and I'm less suspicious for urinary or intra-abdominal emergency or source of her back discomfort.  Symptoms seem possibly related to sciatica. Patient does not seem to have symptoms consistent with cauda equina.  Lumbar spine was obtained and found to have age indeterminate L3 compression fracture which may be the source of her  discomfort.  I did perform a rectal exam patient does have rectal impaction and some fecal disimpaction was performed and patient was given an enema.  she had good results with bowel movement here in the emergency department. I did discuss with them whether or not she should go back to her assisted living versus consider higher level of care, and patient adamantly wants to go back to her assisted living and thinks that that tramadol will provide adequate pain relief. Her daughter is in agreement with this plan.    DIFFERENTIAL DIAGNOSIS: including but not limited to sciatica, radiculopathy, cauda equina, intra-abdominal sources such as appendicitis, diverticulitis, AAA, urinary tract infection, pyelonephritis, kidney stone, compression fracture  CONSULTATIONS:  None  Patient / Family / Caregiver informed of clinical course, medical decision-making process, and agree with plan.   I discussed return precautions, follow-up instructions, and discharge instructions with patient and/or family.  Discharge Instructions : you were evaluated for low back pain and found to have L3 compression fracture. You are being prescribed tramadol to help with pain. You may still try over-the-counter Tylenol and/or ibuprofen as needed for mild pain in treatment off for more moderate to severe pain.  You also had constipation and fecal impaction and were given enema with some successful result in the emergency department.  Return to the emergency room immediately for any worsening symptoms including abdominal pain, weakness, numbness, incontinence, worsening or uncontrolled back pain, or any other symptoms concerning to you.  ___________________________________________   FINAL CLINICAL IMPRESSION(S) / ED DIAGNOSES   Final diagnoses:  Fecal impaction in rectum (HCC)  Constipation, unspecified constipation type  Closed compression fracture of third lumbar vertebra, initial encounter ([place])  Note: This dictation was prepared with Dragon dictation. Any transcriptional errors that result from this process are unintentional    Lisa Roca, MD 05/19/17 1534

## 2017-05-19 NOTE — Telephone Encounter (Signed)
Patient is admitted. Deferred order until discharge.

## 2017-05-19 NOTE — Discharge Instructions (Signed)
you were evaluated for low back pain and found to have L3 compression fracture. You are being prescribed tramadol to help with pain. You may still try over-the-counter Tylenol and/or ibuprofen as needed for mild pain in treatment off for more moderate to severe pain.  You also had constipation and fecal impaction and were given enema with some successful result in the emergency department.  Return to the emergency room immediately for any worsening symptoms including abdominal pain, weakness, numbness, incontinence, worsening or uncontrolled back pain, or any other symptoms concerning to you.

## 2017-05-19 NOTE — ED Notes (Signed)
Patient remains on bedside commode.

## 2017-05-19 NOTE — ED Notes (Addendum)
Patient had large, brown, liquid BM. Patient had to be assisted to the wheelchair with two-person assist.

## 2017-05-19 NOTE — ED Notes (Signed)
Patient tolerating enema well. Instructed to call when need to have BM.

## 2017-05-22 ENCOUNTER — Telehealth: Payer: Self-pay | Admitting: Family Medicine

## 2017-05-22 DIAGNOSIS — G8929 Other chronic pain: Secondary | ICD-10-CM

## 2017-05-22 DIAGNOSIS — M545 Low back pain: Principal | ICD-10-CM

## 2017-05-22 NOTE — Telephone Encounter (Signed)
Busy, will try again later.

## 2017-05-22 NOTE — Telephone Encounter (Signed)
Whitney Hoffman as below. She reports that she also has naproxen. Could she take this along with Tramadol instead? If so, could you write an order? Please advise. Also, fax order to [phone]. Thanks!

## 2017-05-22 NOTE — Telephone Encounter (Signed)
Levada Dy A with [facility] called to request a verbal order for Tylenol 3 to help with back pain.  Please include PRN.  OR#[phone]

## 2017-05-22 NOTE — Telephone Encounter (Signed)
Please advise 

## 2017-05-22 NOTE — Telephone Encounter (Signed)
Yes, she can take naproxen in addition to tramadol.

## 2017-05-22 NOTE — Telephone Encounter (Signed)
She is allergic to codeine, so she cannot take Tylenol #3. She take take OTC extra strength tylenol every six hours in addition to tramadol that was prescribed last week.

## 2017-05-23 ENCOUNTER — Ambulatory Visit (INDEPENDENT_AMBULATORY_CARE_PROVIDER_SITE_OTHER): Payer: Medicare Other | Admitting: *Deleted

## 2017-05-23 ENCOUNTER — Telehealth: Payer: Self-pay | Admitting: Cardiology

## 2017-05-23 ENCOUNTER — Telehealth: Payer: Self-pay

## 2017-05-23 DIAGNOSIS — I495 Sick sinus syndrome: Secondary | ICD-10-CM | POA: Diagnosis not present

## 2017-05-23 MED ORDER — TRAMADOL HCL 50 MG PO TABS: 50.0000 mg | ORAL_TABLET | Freq: Four times a day (QID) | ORAL | 3 refills | Status: DC | PRN

## 2017-05-23 NOTE — Telephone Encounter (Signed)
[facility] supervisor advised and RX called in to [facility], RMA

## 2017-05-23 NOTE — Telephone Encounter (Signed)
Tamela from [facility] had called stating that patient was complaining of back pain, Tamela says x-ray from last week showed lumbar fracture and patient was prescribed Tramadol, they are requesting a refill for Tramadol PRN for patient. KW

## 2017-05-23 NOTE — Telephone Encounter (Signed)
Levada Dy was advised.

## 2017-05-23 NOTE — Telephone Encounter (Signed)
Confirmed remote transmission w/ pt daughter.   

## 2017-05-24 ENCOUNTER — Telehealth: Payer: Self-pay

## 2017-05-24 ENCOUNTER — Encounter: Payer: Self-pay | Admitting: Cardiology

## 2017-05-24 DIAGNOSIS — S3210XA Unspecified fracture of sacrum, initial encounter for closed fracture: Secondary | ICD-10-CM | POA: Diagnosis not present

## 2017-05-24 DIAGNOSIS — S32030A Wedge compression fracture of third lumbar vertebra, initial encounter for closed fracture: Secondary | ICD-10-CM | POA: Diagnosis not present

## 2017-05-24 MED ORDER — POLYETHYLENE GLYCOL 3350 17 GM/SCOOP PO POWD: 17.0000 g | Freq: Two times a day (BID) | ORAL | 5 refills | Status: DC | PRN

## 2017-05-24 NOTE — Progress Notes (Signed)
Remote pacemaker transmission.   

## 2017-05-24 NOTE — Telephone Encounter (Signed)
Whitney Hoffman requesting Miralax rx be called into [facility] ASAP. Pt lives at [facility] and needs a doctor's order for this.

## 2017-05-24 NOTE — Telephone Encounter (Signed)
Faxed over written Rx. To pharmacy at [facility]. Janie advised.

## 2017-05-24 NOTE — Telephone Encounter (Signed)
OK to call in Miralax.

## 2017-05-25 LAB — CUP PACEART REMOTE DEVICE CHECK
Battery Remaining Longevity: 69 mo
Battery Voltage: 2.78 V
Brady Statistic RV Percent Paced: 21 %
Date Time Interrogation Session: 20181003144806
Implantable Lead Implant Date: 20111108
Implantable Lead Location: 753860
Implantable Lead Model: 5076
Implantable Pulse Generator Implant Date: 20111108
Lead Channel Impedance Value: 561 Ohm
Lead Channel Impedance Value: 67 Ohm
Lead Channel Sensing Intrinsic Amplitude: 11.2 mV
Lead Channel Setting Pacing Pulse Width: 0.4 ms
MDC IDC LEAD IMPLANT DT: 20111108
MDC IDC LEAD LOCATION: 753859
MDC IDC MSMT BATTERY IMPEDANCE: 906 Ohm
MDC IDC MSMT LEADCHNL RV PACING THRESHOLD AMPLITUDE: 0.875 V
MDC IDC MSMT LEADCHNL RV PACING THRESHOLD PULSEWIDTH: 0.4 ms
MDC IDC SET LEADCHNL RV PACING AMPLITUDE: 2.5 V
MDC IDC SET LEADCHNL RV SENSING SENSITIVITY: 4 mV

## 2017-06-01 ENCOUNTER — Telehealth: Payer: Self-pay | Admitting: Family Medicine

## 2017-06-01 DIAGNOSIS — M810 Age-related osteoporosis without current pathological fracture: Secondary | ICD-10-CM

## 2017-06-01 DIAGNOSIS — M4850XA Collapsed vertebra, not elsewhere classified, site unspecified, initial encounter for fracture: Secondary | ICD-10-CM

## 2017-06-01 NOTE — Telephone Encounter (Signed)
What is causing the drowsiness? Dr. Caryn Section had the Tramadol prescription phoned in on 05-23-17.

## 2017-06-01 NOTE — Telephone Encounter (Signed)
[place] with [facility] is requesting a written Rx for traMADol (ULTRAM) 50 MG tablet something to help with lower back pain.  Is causing to much drowsiness and Aleve is not strong enough to help with the pain.  HS#[phone]/[id number]

## 2017-06-01 NOTE — Telephone Encounter (Signed)
Please advise 

## 2017-06-02 NOTE — Telephone Encounter (Signed)
Tanzania from [facility] states Tramadol is making pt drowsy and Aleve is not strong enough to help with low back pain. Requesting written order for a different medication for back pain. They are requesting that Aleve be written as a scheduled medication. Also requesting written order for an elevated toilet seat and thermacare-a patch for lower back. Please advise? [phone]

## 2017-06-05 NOTE — Telephone Encounter (Signed)
Called [place] to advise below. She reports that she will call back if any questions. She was also advised that our phones were down. I have the fax on my desk to re-fax when the phone line comes available.

## 2017-06-05 NOTE — Telephone Encounter (Signed)
   She should not take Aleve because it interacts with  Xarelto.   She can take 2 regular strength tylenol four times a day for pain.   OK to order Thermacare patch to back three times a day as needed  Need referral to endocrinology for osteoporosis and new vertebral compression fracture  The only stronger pain medication would be something like percocet or Vicodin, which would make constipation worse. If she wants to try something like that she will need a printed prescription that will need to be picked up, and she will need to take another medication for constipation.

## 2017-06-09 ENCOUNTER — Ambulatory Visit: Payer: Medicare Other | Admitting: Urology

## 2017-06-09 DIAGNOSIS — Z23 Encounter for immunization: Secondary | ICD-10-CM | POA: Diagnosis not present

## 2017-06-12 ENCOUNTER — Other Ambulatory Visit: Payer: Self-pay | Admitting: Family Medicine

## 2017-06-12 ENCOUNTER — Telehealth: Payer: Self-pay | Admitting: Family Medicine

## 2017-06-12 NOTE — Telephone Encounter (Signed)
Please see note from 06-01-2017. Aleve needs to be discontinued due to interaction with Xarelto.

## 2017-06-12 NOTE — Telephone Encounter (Signed)
Please advise 

## 2017-06-12 NOTE — Telephone Encounter (Signed)
T[place]ania with [facility] stated they have orders for pt to receive Aleve as needed but they would like verbal orders for pt to receive the medication twice a day. Please advise. Thanks TNP

## 2017-06-12 NOTE — Telephone Encounter (Signed)
Whitney Hoffman was notified to discontinue use of Aleve. They are requesting another rx be prescribed to help with pain? Please advise?

## 2017-06-13 MED ORDER — HYDROCODONE-ACETAMINOPHEN 5-325 MG PO TABS: 1.0000 | ORAL_TABLET | Freq: Three times a day (TID) | ORAL | 0 refills | Status: DC | PRN

## 2017-06-13 NOTE — Telephone Encounter (Signed)
Only other options besides tylenol and tramadol are opioids which require a printed prescription that they will have to pick up, and will make her more constipated. If that's what she wants then can print prescription for hydrocodone/apap 5/325, 1 every eight hours as needed for pain, #30.

## 2017-06-13 NOTE — Telephone Encounter (Signed)
[place] as below. Medication was printed. She reports that she will advise a family member to come and pick up the prescription.

## 2017-06-14 ENCOUNTER — Telehealth: Payer: Self-pay | Admitting: Family Medicine

## 2017-06-15 ENCOUNTER — Telehealth: Payer: Self-pay | Admitting: Internal Medicine

## 2017-06-15 NOTE — Telephone Encounter (Signed)
S/w patient's daughter, ok per DPR. She said patient's PCP and pharmacist recommended patient not take Aleve along with Xarelto. She was told in Jan 08, 2017 that Dr Caryl Comes advised ok to try Aleve for pain associated with broken sacrum. This is documented in telephone encounter on 01/04/17. However, at this time, daughter does not want patient to take the Aleve for pain. She was appreciative and had the aleve removed from her mother's medications where she resides.

## 2017-06-15 NOTE — Telephone Encounter (Signed)
Pt daughter is calling to ask if it is ok for pt to take Aleve with Xarelto. She states pt PCP is telling pt she cannot take these 2 together.  States pt cannot take Tramadol it makes her "loopy" or Hydrocodone makes her "sick as a dog"(

## 2017-06-16 ENCOUNTER — Other Ambulatory Visit: Payer: Self-pay | Admitting: Family Medicine

## 2017-06-16 NOTE — Telephone Encounter (Signed)
Pt contacted office for refill request on the following medications:  acetaminophen (TYLENOL 8 HOUR) 650 MG CR tablet.  As needed  Pt daughter is requesting this sent to [facility] Fax [phone]

## 2017-06-16 NOTE — Telephone Encounter (Signed)
To be faxed to # below.

## 2017-06-18 MED ORDER — ACETAMINOPHEN ER 650 MG PO TBCR: 650.0000 mg | EXTENDED_RELEASE_TABLET | Freq: Three times a day (TID) | ORAL | 3 refills | Status: DC | PRN

## 2017-06-20 DIAGNOSIS — S3210XA Unspecified fracture of sacrum, initial encounter for closed fracture: Secondary | ICD-10-CM | POA: Diagnosis not present

## 2017-06-20 DIAGNOSIS — S32030A Wedge compression fracture of third lumbar vertebra, initial encounter for closed fracture: Secondary | ICD-10-CM | POA: Diagnosis not present

## 2017-06-21 DIAGNOSIS — I10 Essential (primary) hypertension: Secondary | ICD-10-CM | POA: Diagnosis not present

## 2017-06-21 DIAGNOSIS — S3210XD Unspecified fracture of sacrum, subsequent encounter for fracture with routine healing: Secondary | ICD-10-CM | POA: Diagnosis not present

## 2017-06-21 DIAGNOSIS — M810 Age-related osteoporosis without current pathological fracture: Secondary | ICD-10-CM | POA: Diagnosis not present

## 2017-06-21 DIAGNOSIS — G20 Parkinson's disease: Secondary | ICD-10-CM | POA: Diagnosis not present

## 2017-06-21 DIAGNOSIS — S32030D Wedge compression fracture of third lumbar vertebra, subsequent encounter for fracture with routine healing: Secondary | ICD-10-CM | POA: Diagnosis not present

## 2017-06-21 DIAGNOSIS — H269 Unspecified cataract: Secondary | ICD-10-CM | POA: Diagnosis not present

## 2017-06-21 DIAGNOSIS — H3530 Unspecified macular degeneration: Secondary | ICD-10-CM | POA: Diagnosis not present

## 2017-06-22 DIAGNOSIS — I10 Essential (primary) hypertension: Secondary | ICD-10-CM | POA: Diagnosis not present

## 2017-06-22 DIAGNOSIS — G20 Parkinson's disease: Secondary | ICD-10-CM | POA: Diagnosis not present

## 2017-06-22 DIAGNOSIS — S32030D Wedge compression fracture of third lumbar vertebra, subsequent encounter for fracture with routine healing: Secondary | ICD-10-CM | POA: Diagnosis not present

## 2017-06-22 DIAGNOSIS — M810 Age-related osteoporosis without current pathological fracture: Secondary | ICD-10-CM | POA: Diagnosis not present

## 2017-06-22 DIAGNOSIS — H269 Unspecified cataract: Secondary | ICD-10-CM | POA: Diagnosis not present

## 2017-06-22 DIAGNOSIS — S3210XD Unspecified fracture of sacrum, subsequent encounter for fracture with routine healing: Secondary | ICD-10-CM | POA: Diagnosis not present

## 2017-06-25 ENCOUNTER — Encounter: Payer: Self-pay | Admitting: Intensive Care

## 2017-06-25 ENCOUNTER — Other Ambulatory Visit: Payer: Self-pay

## 2017-06-25 ENCOUNTER — Emergency Department
Admission: EM | Admit: 2017-06-25 | Discharge: 2017-06-25 | Disposition: A | Discharge status: Home / Self Care | Payer: Medicare Other | Attending: Emergency Medicine | Admitting: Emergency Medicine

## 2017-06-25 DIAGNOSIS — K5900 Constipation, unspecified: Secondary | ICD-10-CM | POA: Diagnosis not present

## 2017-06-25 DIAGNOSIS — I110 Hypertensive heart disease with heart failure: Secondary | ICD-10-CM | POA: Insufficient documentation

## 2017-06-25 DIAGNOSIS — Z7901 Long term (current) use of anticoagulants: Secondary | ICD-10-CM | POA: Insufficient documentation

## 2017-06-25 DIAGNOSIS — Z79899 Other long term (current) drug therapy: Secondary | ICD-10-CM | POA: Insufficient documentation

## 2017-06-25 DIAGNOSIS — Z950 Presence of cardiac pacemaker: Secondary | ICD-10-CM | POA: Diagnosis not present

## 2017-06-25 DIAGNOSIS — I5032 Chronic diastolic (congestive) heart failure: Secondary | ICD-10-CM | POA: Insufficient documentation

## 2017-06-25 DIAGNOSIS — G20 Parkinson's disease: Secondary | ICD-10-CM | POA: Diagnosis not present

## 2017-06-25 MED ORDER — POLYETHYLENE GLYCOL 3350 17 GM/SCOOP PO POWD: 17.0000 g | Freq: Two times a day (BID) | ORAL | 5 refills | Status: DC | PRN

## 2017-06-25 MED ORDER — MAGNESIUM CITRATE PO SOLN
1.0000 | Freq: Once | ORAL | Status: AC
  Administered 2017-06-25: 1 via ORAL
  Filled 2017-06-25: qty 296

## 2017-06-25 MED ORDER — DOCUSATE SODIUM 50 MG/5ML PO LIQD
100.0000 mg | Freq: Once | ORAL | Status: AC
  Administered 2017-06-25: 100 mg via ORAL
  Filled 2017-06-25: qty 10

## 2017-06-25 NOTE — ED Notes (Signed)
Patient does not appear to be in any acute distress at time of discharge. Patient wheeled to lobby in wheelchair. Patient denies any comments or concerns regarding discharge.  

## 2017-06-25 NOTE — ED Triage Notes (Addendum)
Patient arrives with c/o constipation. Reports last BM was 10/29. Denies N/V or pain. Also reports recent fractures to Lumbar and sacrum X1 month ago

## 2017-06-25 NOTE — Discharge Instructions (Signed)
You were seen in the emergency department today for constipation.  Drink plenty of water every day.  We also recommend that you use one or more of the following over-the-counter medications in the order described:   1)  Colace (or Dulcolax) 100 mg:  This is a stool softener, and you may take it once or twice a day as needed. 2)  Senna tablets:  This is a bowel stimulant that will help "push" out your stool. It is the next step to add after you have tried a stool softener. 3)  Miralax (powder):  This medication works by drawing additional fluid into your intestines and helps to flush out your stool.  Mix the powder with water or juice according to label instructions.  It may help if the Colace and Senna are not sufficient, but you must be sure to use the recommended amount of water or juice when you mix up the powder.  You may take this once or twice a day; start with once daily, and if it is insufficient, try twice daily. 4)  Look for magnesium citrate at the pharmacy (it is usually a small glass bottle).  Drink the bottle according to the label instructions.  Remember that narcotic pain medications are constipating, so avoid them or minimize their use.  Drink plenty of fluids.  Please return to the Emergency Department immediately if you develop new or worsening symptoms that concern you, such as (but not limited to) fever > 101 degrees, severe abdominal pain, or persistent vomiting.

## 2017-06-25 NOTE — ED Notes (Signed)
Patient had large BM, light brown, soft and well formed.Patient filled a little more than the bottom 1/4 of the pail bedside commode. Patient stated " I feel a whole lot better now than I did when I came in"

## 2017-06-25 NOTE — ED Provider Notes (Signed)
[facility] Emergency Department Provider Note  ____________________________________________   First MD Initiated Contact with Patient 06/25/17 1240     (approximate)  I have reviewed the triage vital signs and the nursing notes.   HISTORY  Chief Complaint Constipation    HPI Whitney Hoffman is a 83 y.o. female who presents for evaluation of constipation.  She and her daughter both report that the patient suffers from frequent episodes of constipation.  She previously took MiraLAX twice a day but was taken off of it because her doctor was afraid she would develop diarrhea.  She has not had a bowel movement for several days and is causing a sensation of abdominal distention and abdominal aching pain.  She states the symptoms are severe and nothing makes it better or worse.  She denies fever/chills, chest pain, shortness of breath, nausea, and vomiting.  She has not had any recent dysuria.  She has required enemas in the past and occasional has been disimpacted.   Past Medical History:  Diagnosis Date  . Atrial fibrillation ([place])   . Parkinson disease ([place])   . Tachy-brady syndrome (HCC)    with medtronic dual chambe rPCM  Chronic constipation  Patient Active Problem List   Diagnosis Date Noted  . Vertebral compression fracture ([place]) 05/19/2017  . Back pain 11/30/2016  . Allergic rhinitis 06/15/2015  . Arthritis 06/15/2015  . History of colonic polyps 06/15/2015  . DDD (degenerative disc disease), lumbar 06/15/2015  . Fatigue 06/15/2015  . Irritable bladder 06/15/2015  . Left knee pain 06/15/2015  . Nevus 06/15/2015  . Parkinson's disease ([place]) 06/15/2015  . Peptic ulcer disease 06/15/2015  . Skin cancer of nose 06/15/2015  . Vertigo 06/15/2015  . Dyspnea on exertion 08/08/2013  . Chronic diastolic heart failure ([place]) 08/07/2012  . VENOUS HYPERTENSION, CHRONIC W/ INFLAMMATION 07/21/2010  . Essential hypertension 07/08/2010  . ATRIAL  FIBRILLATION 07/08/2010  . PACEMAKER-Medtronic 07/08/2010  . Senile osteoporosis 10/18/2009  . Pure hypercholesterolemia 10/05/2009    Past Surgical History:  Procedure Laterality Date  . ABDOMINAL HYSTERECTOMY  1966   fibroids ovaries removed secondary to ovarian cyst  . APPENDECTOMY  1944  . baldder tack     with her hysterectomy  . BREAST BIOPSY  2010   left benign repeat ultrasound recommneded in 2011 [facility] [place]    . CHOLECYSTECTOMY  1969  . GALLBLADDER SURGERY    . HIP SURGERY Left    ORIF x 2; post op infection Left hip  . PACEMAKER PLACEMENT  06/29/2010  . WRIST SURGERY  1996   Traumatic B    Prior to Admission medications   Medication Sig Start Date End Date Taking? Authorizing Provider  acetaminophen (TYLENOL) 500 MG tablet Take 500 mg every 6 (six) hours as needed by mouth.   Yes [provider]  Calcium Carbonate-Vitamin D (CALCIUM 600+D HIGH POTENCY PO) Take 1 tablet 2 (two) times daily by mouth.   Yes [provider]  cetirizine (ZYRTEC) 10 MG tablet Chew 10 mg by mouth daily.   Yes [provider]  Cholecalciferol (VITAMIN D-3) 5000 units TABS Take 1 tablet by mouth daily.   Yes [provider]  conjugated estrogens (PREMARIN) vaginal cream Place 1 Applicatorful 3 (three) times a week vaginally. APPLY 1 APPLICATORFUL VAGINALLY THREE NIGHTS PER WEEK (MON, WED AND FRI)   Yes [provider]  Cyanocobalamin (VITAMIN B-12) 5000 MCG SUBL Place 1 tablet daily under the tongue.  Yes [provider]  docusate sodium (COLACE) 100 MG capsule One capsule every morning and one capsule every evening 03/03/17  Yes Fisher, Kirstie Peri, MD  ezetimibe (ZETIA) 10 MG tablet Take 1 tablet (10 mg total) by mouth daily. 11/30/16  Yes Birdie Sons, MD  metoprolol (LOPRESSOR) 50 MG tablet TAKE 2 TABLETS BY MOUTH ONCE EVERY MORNING AND TAKE 1 TABLET BY MOUTHEVERYEVENING 12/01/16  Yes Deboraha Sprang, MD  Milk  Thistle 175 MG CAPS Take by mouth.   Yes [provider]  mirabegron ER (MYRBETRIQ) 25 MG TB24 tablet Take 1 tablet (25 mg total) by mouth daily. 05/12/17  Yes McGowan, Larene Beach A, PA-C  ondansetron (ZOFRAN) 4 MG tablet Take 4 mg every 6 (six) hours as needed by mouth for nausea or vomiting.   Yes [provider]  Rivaroxaban (XARELTO) 15 MG TABS tablet Take 1 tablet (15 mg total) by mouth daily with supper. 12/01/16  Yes Deboraha Sprang, MD  furosemide (LASIX) 20 MG tablet Take 1 tablet (20 mg total) by mouth daily. Patient not taking: Reported on 04/28/2017 01/10/17   Birdie Sons, MD  HYDROcodone-acetaminophen (NORCO/VICODIN) 5-325 MG tablet Take 1 tablet by mouth every 8 (eight) hours as needed for moderate pain. Patient not taking: Reported on 06/25/2017 06/13/17   Birdie Sons, MD  loperamide (IMODIUM A-D) 2 MG tablet One tablet by mouth as needed for diarhea up to 3 tablets in 24 hours 03/03/17   Birdie Sons, MD  oxybutynin (DITROPAN-XL) 5 MG 24 hr tablet TAKE 1 TABLET BY MOUTH ONCE DAILY. Patient not taking: Reported on 05/12/2017 09/03/16   Birdie Sons, MD  polyethylene glycol powder (GLYCOLAX/MIRALAX) powder Take 17 g 2 (two) times daily as needed by mouth. 06/25/17   Hinda Kehr, MD  traMADol (ULTRAM) 50 MG tablet Take 1 tablet (50 mg total) by mouth every 6 (six) hours as needed. Patient not taking: Reported on 06/25/2017 05/23/17   Birdie Sons, MD    Allergies Aspirin; Codeine; Diazepam; Meperidine hcl; Morphine; and Propoxyphene  Family History  Problem Relation Age of Onset  . Diabetes Mother   . Coronary artery disease Father   . Heart attack Father   . Alzheimer's disease Father   . Kidney cancer Neg Hx   . Bladder Cancer Neg Hx     Social History Social History   Tobacco Use  . Smoking status: Never Smoker  . Smokeless tobacco: Never Used  . Tobacco comment: tobacco use- no   Substance Use Topics  . Alcohol use: No  . Drug use: No      Review of Systems Constitutional: No fever/chills Cardiovascular: Denies chest pain. Respiratory: Denies shortness of breath. Gastrointestinal: Lower abdominal aching pain thought to be secondary to constipation. Genitourinary: Negative for dysuria. Musculoskeletal: Negative for neck pain.  Negative for back pain. Integumentary: Negative for rash. Neurological: Negative for headaches, focal weakness or numbness.   ____________________________________________   PHYSICAL EXAM:  VITAL SIGNS: ED Triage Vitals [06/25/17 1004]  Enc Vitals Group     BP 128/69     Pulse Rate 85     Resp 16     Temp 97.7 F (36.5 C)     Temp Source Oral     SpO2 96 %     Weight 66.7 kg (147 lb)     Height 1.549 m (5\' 1" )     Head Circumference      [facility] Flow  Pain Score      Pain Loc      Pain Edu?      Excl. in [place]?     Constitutional: Alert and oriented. Well appearing for age Eyes: Conjunctivae are normal.  Head: Atraumatic. Cardiovascular: Normal rate, regular rhythm. Good peripheral circulation. Grossly normal heart sounds. Respiratory: Normal respiratory effort.  No retractions. Lungs CTAB. Gastrointestinal: Soft and nontender. No distention.  Musculoskeletal: No lower extremity tenderness nor edema. No gross deformities of extremities. Neurologic:  Normal speech and language. No gross focal neurologic deficits are appreciated.  Skin:  Skin is warm, dry and intact. No rash noted. Psychiatric: Mood and affect are normal. Speech and behavior are normal.  ____________________________________________   LABS (all labs ordered are listed, but only abnormal results are displayed)  Labs Reviewed - No data to display ____________________________________________  EKG  None - EKG not ordered by ED physician ____________________________________________  RADIOLOGY   No results found.  ____________________________________________   PROCEDURES  Critical Care performed:  No   Procedure(s) performed:   Procedures   ____________________________________________   INITIAL IMPRESSION / ASSESSMENT AND PLAN / ED COURSE  As part of my medical decision making, I reviewed the following data within the [place] History obtained from family, Nursing notes reviewed and incorporated, Old chart reviewed and Notes from prior ED visits    Differential diagnosis is broad and includes but is not limited to constipation, abdominal infection, SBO/ileus, other obstruction, infection, etc.  However the patient and family give a strong history of constipation and she has not been taking MiraLAX which helped previously.  We discussed digital disimpaction versus enema and agreed that we would first try an enema.  No indication for imaging at this time and vital signs are stable.   Clinical Course as of Jun 25 1641  Sun Jun 25, 2017  1527 Patient states she feels so much better and is ready to go home after having a "huge" bowel movement after the enema.  She has no residual discomfort, states that she is starving, and is very anxious to leave.  No abdominal tenderness/pain.  I gave my usual and customary management recommendations and return precautions.  Family agrees with plan.  [CF]    Clinical Course User Index [CF] Hinda Kehr, MD    ____________________________________________  FINAL CLINICAL IMPRESSION(S) / ED DIAGNOSES  Final diagnoses:  Constipation, unspecified constipation type     MEDICATIONS GIVEN DURING THIS VISIT:  Medications  docusate (COLACE) 50 MG/5ML liquid 100 mg (100 mg Oral Given 06/25/17 1356)  magnesium citrate solution 1 Bottle (1 Bottle Oral Given 06/25/17 1353)     NEW OUTPATIENT MEDICATIONS STARTED DURING THIS VISIT:  This SmartLink is deprecated. Use AVSMEDLIST instead to display the medication list for a patient.  This SmartLink is deprecated. Use AVSMEDLIST instead to display the medication list for a  patient.  This SmartLink is deprecated. Use AVSMEDLIST instead to display the medication list for a patient.   Note:  This document was prepared using Dragon voice recognition software and may include unintentional dictation errors.    Hinda Kehr, MD 06/25/17 [phone]

## 2017-06-26 DIAGNOSIS — S3210XD Unspecified fracture of sacrum, subsequent encounter for fracture with routine healing: Secondary | ICD-10-CM | POA: Diagnosis not present

## 2017-06-26 DIAGNOSIS — M810 Age-related osteoporosis without current pathological fracture: Secondary | ICD-10-CM | POA: Diagnosis not present

## 2017-06-26 DIAGNOSIS — S32030D Wedge compression fracture of third lumbar vertebra, subsequent encounter for fracture with routine healing: Secondary | ICD-10-CM | POA: Diagnosis not present

## 2017-06-26 DIAGNOSIS — I10 Essential (primary) hypertension: Secondary | ICD-10-CM | POA: Diagnosis not present

## 2017-06-26 DIAGNOSIS — H269 Unspecified cataract: Secondary | ICD-10-CM | POA: Diagnosis not present

## 2017-06-26 DIAGNOSIS — G20 Parkinson's disease: Secondary | ICD-10-CM | POA: Diagnosis not present

## 2017-06-29 DIAGNOSIS — I10 Essential (primary) hypertension: Secondary | ICD-10-CM | POA: Diagnosis not present

## 2017-06-29 DIAGNOSIS — S32030D Wedge compression fracture of third lumbar vertebra, subsequent encounter for fracture with routine healing: Secondary | ICD-10-CM | POA: Diagnosis not present

## 2017-06-29 DIAGNOSIS — H269 Unspecified cataract: Secondary | ICD-10-CM | POA: Diagnosis not present

## 2017-06-29 DIAGNOSIS — S3210XD Unspecified fracture of sacrum, subsequent encounter for fracture with routine healing: Secondary | ICD-10-CM | POA: Diagnosis not present

## 2017-06-29 DIAGNOSIS — G20 Parkinson's disease: Secondary | ICD-10-CM | POA: Diagnosis not present

## 2017-06-29 DIAGNOSIS — M810 Age-related osteoporosis without current pathological fracture: Secondary | ICD-10-CM | POA: Diagnosis not present

## 2017-07-03 DIAGNOSIS — S3210XD Unspecified fracture of sacrum, subsequent encounter for fracture with routine healing: Secondary | ICD-10-CM | POA: Diagnosis not present

## 2017-07-03 DIAGNOSIS — G20 Parkinson's disease: Secondary | ICD-10-CM | POA: Diagnosis not present

## 2017-07-03 DIAGNOSIS — M810 Age-related osteoporosis without current pathological fracture: Secondary | ICD-10-CM | POA: Diagnosis not present

## 2017-07-03 DIAGNOSIS — H269 Unspecified cataract: Secondary | ICD-10-CM | POA: Diagnosis not present

## 2017-07-03 DIAGNOSIS — I10 Essential (primary) hypertension: Secondary | ICD-10-CM | POA: Diagnosis not present

## 2017-07-03 DIAGNOSIS — S32030D Wedge compression fracture of third lumbar vertebra, subsequent encounter for fracture with routine healing: Secondary | ICD-10-CM | POA: Diagnosis not present

## 2017-07-06 DIAGNOSIS — S32030D Wedge compression fracture of third lumbar vertebra, subsequent encounter for fracture with routine healing: Secondary | ICD-10-CM | POA: Diagnosis not present

## 2017-07-06 DIAGNOSIS — M810 Age-related osteoporosis without current pathological fracture: Secondary | ICD-10-CM | POA: Diagnosis not present

## 2017-07-06 DIAGNOSIS — G20 Parkinson's disease: Secondary | ICD-10-CM | POA: Diagnosis not present

## 2017-07-06 DIAGNOSIS — I10 Essential (primary) hypertension: Secondary | ICD-10-CM | POA: Diagnosis not present

## 2017-07-06 DIAGNOSIS — H269 Unspecified cataract: Secondary | ICD-10-CM | POA: Diagnosis not present

## 2017-07-06 DIAGNOSIS — S3210XD Unspecified fracture of sacrum, subsequent encounter for fracture with routine healing: Secondary | ICD-10-CM | POA: Diagnosis not present

## 2017-07-10 DIAGNOSIS — S3210XD Unspecified fracture of sacrum, subsequent encounter for fracture with routine healing: Secondary | ICD-10-CM | POA: Diagnosis not present

## 2017-07-10 DIAGNOSIS — I10 Essential (primary) hypertension: Secondary | ICD-10-CM | POA: Diagnosis not present

## 2017-07-10 DIAGNOSIS — G20 Parkinson's disease: Secondary | ICD-10-CM | POA: Diagnosis not present

## 2017-07-10 DIAGNOSIS — H269 Unspecified cataract: Secondary | ICD-10-CM | POA: Diagnosis not present

## 2017-07-10 DIAGNOSIS — S32030D Wedge compression fracture of third lumbar vertebra, subsequent encounter for fracture with routine healing: Secondary | ICD-10-CM | POA: Diagnosis not present

## 2017-07-10 DIAGNOSIS — M810 Age-related osteoporosis without current pathological fracture: Secondary | ICD-10-CM | POA: Diagnosis not present

## 2017-07-10 NOTE — Progress Notes (Signed)
07/11/2017 3:28 PM   Whitney Hoffman July 06, 1924 [id number]  Referring provider: Birdie Sons, [address] [place], [place] [address]  Chief Complaint  Patient presents with  . Follow-up    HPI: 83 yo WF with mixed incontinence and vaginal atrophy who presents today for a one month follow up.    Background history Patient is a 66 -year-old Caucasian female who is referred to [place] by, Dr. Lelon Huh, for urinary incontinence.  Patient states that she has had urinary incontinence for several years ago, then 6 months ago it has gotten more severe.  Patient has incontinence with standing, SUI and UI.   She is experiencing every two hours incontinent episodes during the day.   She is experiencing every two hours incontinent episodes during the night.  Her incontinence volume is large.   She is wearing 3 to 4 pads/depends daily.  She is having associated urinary frequency, urgency, dysuria, nocturia and intermittency.   Her PVR is 115 mL.  She does not have a history of urinary tract infections, STI's or injury to the bladder.  She denies dysuria, gross hematuria, suprapubic pain, back pain, abdominal pain or flank pain.  She has not had any recent fevers, chills, nausea or vomiting.   She had a bladder tack in 1967, but she does not have a history of nephrolithiasis or GU trauma.  She is post menopausal.   She admits to constipation and/or diarrhea.   She is having/ not having pain with bladder filling.  She has not had any recent imaging studies.  She is not drinking a lot of water daily.   She is drinking one caffeinated beverages daily.  She is not drinking alcoholic beverages daily.  Her risk factors for incontinence are vaginal delivery, a family history of incontinence, age, caffeine, depression, fecal incontinence, vaginal atrophy, pelvic surgery and/or neurological disorders.   She is taking antihistamines, opioids and OAB medication.  Reviewed referral notes - has had  urinary incontinence for several years, but has worsened recently and now patient is afraid to do some activities - currently on oxybutynin   On 05/12/2017, she was experiencing urgency x 8 or more, frequency x 8 or more, not restricting fluids to avoid visits to the restroom, is engaging in toilet mapping, incontinence x 8 or more and nocturia x 0-3.   Her PVR was 134 mL.   Her BP was 141/92.  She denied any dysuria, suprapubic pain or gross hematuria.   She had not had any fevers, chills, nausea or vomiting.  She felt the Myrbetriq 25 mg daily helped improve her urinary symptoms somewhat.  She was to continue the Myrbetriq for one more month.  Today, she states that whenever she stands up the urine just gushes out of her and burns.  She says this has been occurring since her ED visit on 05/19/2017 for constipation.  She was also found to have a L3 compression fracture.  This compression fracture was not present on films in May 2018.  Her major complaints are frequency, urgency, dysuria, nocturia and leakage of urine.  Her PVR is 60 mL  Her BP is 154/89.  She has not had any fevers, chills, nausea or vomiting.  She is not having dysuria, gross hematuria or suprapubic pain.   She has visited the ED twice recently for constipation.  She was CATHED for an UA.  UA was negative.    PMH: Past Medical History:  Diagnosis Date  .  Atrial fibrillation ([place])   . Parkinson disease ([place])   . Tachy-brady syndrome ([place])    with medtronic dual chambe rPCM    Surgical History: Past Surgical History:  Procedure Laterality Date  . ABDOMINAL HYSTERECTOMY  1966   fibroids ovaries removed secondary to ovarian cyst  . APPENDECTOMY  1944  . baldder tack     with her hysterectomy  . BREAST BIOPSY  2010   left benign repeat ultrasound recommneded in 2011 [facility] [place]    . CHOLECYSTECTOMY  1969  . GALLBLADDER SURGERY    . HIP SURGERY Left    ORIF x 2; post op infection Left hip  .  PACEMAKER PLACEMENT  06/29/2010  . WRIST SURGERY  1996   Traumatic B    Home Medications:  Allergies as of 07/11/2017      Reactions   Aspirin    Sick on stomach   Codeine    Feel dizzy   Diazepam    Sick on stomach   Meperidine Hcl    Sick on stomach    Morphine    Nausea/sick on stomach   Propoxyphene       Medication List        Accurate as of 07/11/17  3:28 PM. Always use your most recent med list.          CALCIUM 600+D HIGH POTENCY PO Take 1 tablet 2 (two) times daily by mouth.   conjugated estrogens vaginal cream Commonly known as:  PREMARIN Place 1 Applicatorful 3 (three) times a week vaginally. APPLY 1 APPLICATORFUL VAGINALLY THREE NIGHTS PER WEEK (MON, WED AND FRI)   docusate sodium 100 MG capsule Commonly known as:  COLACE One capsule every morning and one capsule every evening   ezetimibe 10 MG tablet Commonly known as:  ZETIA Take 1 tablet (10 mg total) by mouth daily.   loperamide 2 MG tablet Commonly known as:  IMODIUM A-D One tablet by mouth as needed for diarhea up to 3 tablets in 24 hours   metoprolol tartrate 50 MG tablet Commonly known as:  LOPRESSOR TAKE 2 TABLETS BY MOUTH ONCE EVERY MORNING AND TAKE 1 TABLET BY MOUTHEVERYEVENING   Milk Thistle 175 MG Caps Take by mouth.   mirabegron ER 25 MG Tb24 tablet Commonly known as:  MYRBETRIQ Take 1 tablet (25 mg total) by mouth daily.   polyethylene glycol powder powder Commonly known as:  GLYCOLAX/MIRALAX Take 17 g 2 (two) times daily as needed by mouth.   Rivaroxaban 15 MG Tabs tablet Commonly known as:  XARELTO Take 1 tablet (15 mg total) by mouth daily with supper.   Vitamin B-12 5000 MCG Subl Place 1 tablet daily under the tongue.   Vitamin D-3 5000 units Tabs Take 1 tablet by mouth daily.       Allergies:  Allergies  Allergen Reactions  . Aspirin     Sick on stomach  . Codeine     Feel dizzy  . Diazepam     Sick on stomach  . Meperidine Hcl     Sick on stomach     . Morphine     Nausea/sick on stomach  . Propoxyphene     Family History: Family History  Problem Relation Age of Onset  . Diabetes Mother   . Coronary artery disease Father   . Heart attack Father   . Alzheimer's disease Father   . Kidney cancer Neg Hx   . Bladder Cancer Neg Hx  Social History:  reports that  has never smoked. she has never used smokeless tobacco. She reports that she does not drink alcohol or use drugs.  ROS: UROLOGY Frequent Urination?: Yes Hard to postpone urination?: Yes Burning/pain with urination?: Yes Get up at night to urinate?: Yes Leakage of urine?: Yes Urine stream starts and stops?: No Trouble starting stream?: No Do you have to strain to urinate?: No Blood in urine?: No Urinary tract infection?: No Sexually transmitted disease?: No Injury to kidneys or bladder?: No Painful intercourse?: No Weak stream?: No Currently pregnant?: No Vaginal bleeding?: No  Gastrointestinal Nausea?: No Vomiting?: No Indigestion/heartburn?: No Diarrhea?: No Constipation?: No  Constitutional Fever: No Night sweats?: No Weight loss?: No Fatigue?: No  Skin Skin rash/lesions?: No Itching?: No  Eyes Blurred vision?: No Double vision?: No  Ears/Nose/Throat Sore throat?: No Sinus problems?: No  Hematologic/Lymphatic Swollen glands?: No Easy bruising?: No  Cardiovascular Leg swelling?: No Chest pain?: No  Respiratory Cough?: No Shortness of breath?: No  Endocrine Excessive thirst?: No  Musculoskeletal Back pain?: No Joint pain?: No  Neurological Headaches?: No Dizziness?: No  Psychologic Depression?: No Anxiety?: No  Physical Exam: BP (!) 154/89   Pulse 84   Ht 5\' 1"  (1.549 m)   BMI 27.78 kg/m   Constitutional: Well nourished. Alert and oriented, No acute distress. HEENT: [place] AT, moist mucus membranes. Trachea midline, no masses. Cardiovascular: No clubbing, cyanosis, or edema. Respiratory: Normal respiratory  effort, no increased work of breathing. Skin: No rashes, bruises or suspicious lesions. Lymph: No cervical or inguinal adenopathy. Neurologic: Grossly intact, no focal deficits, moving all 4 extremities. Psychiatric: Normal mood and affect.  Laboratory Data: Lab Results  Component Value Date   WBC 8.9 05/19/2017   HGB 14.0 05/19/2017   HCT 41.4 05/19/2017   MCV 87.3 05/19/2017   PLT 275 05/19/2017    Lab Results  Component Value Date   CREATININE 0.65 05/19/2017       Component Value Date/Time   CHOL 151 03/06/2017 0922   HDL 51 03/06/2017 0922   CHOLHDL 3.0 03/06/2017 0922   LDLCALC 79 03/06/2017 0922    Lab Results  Component Value Date   AST 17 03/06/2017   Lab Results  Component Value Date   ALT 10 03/06/2017   I have reviewed the labs.  Procedure In and Out Catheterization  Patient is present today for a I & O catheterization due to symptoms of an infection. Patient was cleaned and prepped in a sterile fashion with betadine and Lidocaine 2% jelly was instilled into the urethra.  A 14 FR cath was inserted no complications were noted , 50 ml of urine return was noted, urine was yellow in color. A clean urine sample was collected for UA and culture. Bladder was drained  And catheter was removed with out difficulty.    Preformed by: Reece Packer, RN   Assessment & Plan:    1. Mixed Incontinence  - offered behavioral therapies, bladder training, bladder control strategies and pelvic floor muscle training - patient would like an order for PT at her facility  - fluid management - encouraged patient to increase her water intake  - failed Myrbetriq and oxybutynin  - explained the PTNS provides treatment by indirectly providing electrical stimulation to the nerves responsible for bladder and pelvic floor function - a needle electrode generates an adjustable electrical pulse that travels to the sacral plexus via the tibial nerve which is located in the ankle, among  other functions,  the sacral nerve plexus regulates bladder and pelvic floor function - treatment protocol requires once-a-week treatments for 12 weeks, 30 minutes per session and many patients begin to see improvements by the 6th treatment. Patients who respond to treatment may require occasional treatments (~ once every 3 weeks) to sustain improvements. PTNS is a low-risk procedure. The most common side-effects with PTNS treatment are temporary and minor, resulting from the placement of the needle electrode. They include minor bleeding, mild pain and skin inflammation and patients have seen up to an 80% success rate with this form of treatment  - RTC for PTNS   2. Vaginal atrophy  - continue to apply the cream three nights weekly  - She will follow up in three months for an exam.     Return for PTNS - Sharyn Lull or Lattie Haw need to get PA.  These notes generated with voice recognition software. I apologize for typographical errors.  Zara Council, [facility] [address], [place], [place] [address] [phone]

## 2017-07-11 ENCOUNTER — Ambulatory Visit (INDEPENDENT_AMBULATORY_CARE_PROVIDER_SITE_OTHER): Payer: Medicare Other | Admitting: Urology

## 2017-07-11 ENCOUNTER — Encounter: Payer: Self-pay | Admitting: Urology

## 2017-07-11 ENCOUNTER — Telehealth: Payer: Self-pay | Admitting: Urology

## 2017-07-11 VITALS — BP 154/89 | HR 84 | Ht 61.0 in

## 2017-07-11 DIAGNOSIS — N952 Postmenopausal atrophic vaginitis: Secondary | ICD-10-CM | POA: Diagnosis not present

## 2017-07-11 DIAGNOSIS — N3946 Mixed incontinence: Secondary | ICD-10-CM | POA: Diagnosis not present

## 2017-07-11 LAB — URINALYSIS, COMPLETE
Bilirubin, UA: NEGATIVE
Glucose, UA: NEGATIVE
Ketones, UA: NEGATIVE
LEUKOCYTES UA: NEGATIVE
Nitrite, UA: NEGATIVE
PH UA: 7 (ref 5.0–7.5)
PROTEIN UA: NEGATIVE
Specific Gravity, UA: 1.01 (ref 1.005–1.030)
Urobilinogen, Ur: 0.2 mg/dL (ref 0.2–1.0)

## 2017-07-11 LAB — MICROSCOPIC EXAMINATION
Epithelial Cells (non renal): NONE SEEN /hpf (ref 0–10)
RBC, UA: NONE SEEN /hpf (ref 0–?)
WBC, UA: NONE SEEN /hpf (ref 0–?)

## 2017-07-11 NOTE — Telephone Encounter (Signed)
Would you call [facility] and order pelvic floor physical therapy for this patient?

## 2017-07-12 DIAGNOSIS — M810 Age-related osteoporosis without current pathological fracture: Secondary | ICD-10-CM | POA: Diagnosis not present

## 2017-07-12 DIAGNOSIS — S3210XD Unspecified fracture of sacrum, subsequent encounter for fracture with routine healing: Secondary | ICD-10-CM | POA: Diagnosis not present

## 2017-07-12 DIAGNOSIS — H269 Unspecified cataract: Secondary | ICD-10-CM | POA: Diagnosis not present

## 2017-07-12 DIAGNOSIS — S32030D Wedge compression fracture of third lumbar vertebra, subsequent encounter for fracture with routine healing: Secondary | ICD-10-CM | POA: Diagnosis not present

## 2017-07-12 DIAGNOSIS — G20 Parkinson's disease: Secondary | ICD-10-CM | POA: Diagnosis not present

## 2017-07-12 DIAGNOSIS — I10 Essential (primary) hypertension: Secondary | ICD-10-CM | POA: Diagnosis not present

## 2017-07-14 LAB — CULTURE, URINE COMPREHENSIVE

## 2017-07-17 ENCOUNTER — Telehealth: Payer: Self-pay

## 2017-07-17 ENCOUNTER — Telehealth: Payer: Self-pay | Admitting: Family Medicine

## 2017-07-17 DIAGNOSIS — S32030D Wedge compression fracture of third lumbar vertebra, subsequent encounter for fracture with routine healing: Secondary | ICD-10-CM | POA: Diagnosis not present

## 2017-07-17 DIAGNOSIS — M810 Age-related osteoporosis without current pathological fracture: Secondary | ICD-10-CM | POA: Diagnosis not present

## 2017-07-17 DIAGNOSIS — G20 Parkinson's disease: Secondary | ICD-10-CM | POA: Diagnosis not present

## 2017-07-17 DIAGNOSIS — S3210XD Unspecified fracture of sacrum, subsequent encounter for fracture with routine healing: Secondary | ICD-10-CM | POA: Diagnosis not present

## 2017-07-17 DIAGNOSIS — I10 Essential (primary) hypertension: Secondary | ICD-10-CM | POA: Diagnosis not present

## 2017-07-17 DIAGNOSIS — H269 Unspecified cataract: Secondary | ICD-10-CM | POA: Diagnosis not present

## 2017-07-17 MED ORDER — CETIRIZINE HCL 10 MG PO TABS: 10.0000 mg | ORAL_TABLET | Freq: Every day | ORAL | 11 refills | Status: DC

## 2017-07-17 NOTE — Telephone Encounter (Signed)
Spoke with Levada Dy at [facility] who stated an order would need to be faxed.

## 2017-07-17 NOTE — Telephone Encounter (Signed)
Rx sent to [facility]. Written order sent to [facility].

## 2017-07-17 NOTE — Telephone Encounter (Signed)
Patient's daughter wanted to get an order sent to [facility] in [place] for Zyrtec qd.  She was on this before entering [facility] for her nose running.

## 2017-07-17 NOTE — Telephone Encounter (Signed)
-----   Message from Nori Riis, PA-C sent at 07/14/2017  4:26 PM EST ----- Please let Mrs. Strahm's daughter know that her urine culture is negative.

## 2017-07-17 NOTE — Telephone Encounter (Signed)
Please advise 

## 2017-07-17 NOTE — Telephone Encounter (Signed)
OK to order cetirizine 10mg  once a day. I don't know how they want this order... If it needs to be faxed to [facility] or sent to pharmacy or called in.

## 2017-07-17 NOTE — Telephone Encounter (Signed)
Spoke with pt daughter in reference to ucx results. Daughter voiced understanding.

## 2017-07-19 DIAGNOSIS — M810 Age-related osteoporosis without current pathological fracture: Secondary | ICD-10-CM | POA: Diagnosis not present

## 2017-07-19 DIAGNOSIS — G20 Parkinson's disease: Secondary | ICD-10-CM | POA: Diagnosis not present

## 2017-07-19 DIAGNOSIS — S3210XD Unspecified fracture of sacrum, subsequent encounter for fracture with routine healing: Secondary | ICD-10-CM | POA: Diagnosis not present

## 2017-07-19 DIAGNOSIS — S32030D Wedge compression fracture of third lumbar vertebra, subsequent encounter for fracture with routine healing: Secondary | ICD-10-CM | POA: Diagnosis not present

## 2017-07-19 DIAGNOSIS — H269 Unspecified cataract: Secondary | ICD-10-CM | POA: Diagnosis not present

## 2017-07-19 DIAGNOSIS — I10 Essential (primary) hypertension: Secondary | ICD-10-CM | POA: Diagnosis not present

## 2017-07-20 ENCOUNTER — Telehealth: Payer: Self-pay | Admitting: Urology

## 2017-07-20 NOTE — Telephone Encounter (Signed)
Pt is interested in pelvic floor therapy from somewhere besides the facility.  They thought they would be able to help here there, but it's more in depth than the facility offers, so they would like a referral somewhere else.  Please call Levada Dy at facility [phone]

## 2017-07-23 ENCOUNTER — Telehealth: Payer: Self-pay | Admitting: Urology

## 2017-07-23 NOTE — Telephone Encounter (Signed)
Has patient insurance been cleared for PTNS?  She will also need cardiac clearance prior to starting as she has a pacemaker.

## 2017-07-24 ENCOUNTER — Telehealth: Payer: Self-pay | Admitting: Urology

## 2017-07-24 DIAGNOSIS — M810 Age-related osteoporosis without current pathological fracture: Secondary | ICD-10-CM | POA: Diagnosis not present

## 2017-07-24 DIAGNOSIS — I10 Essential (primary) hypertension: Secondary | ICD-10-CM | POA: Diagnosis not present

## 2017-07-24 DIAGNOSIS — S32030D Wedge compression fracture of third lumbar vertebra, subsequent encounter for fracture with routine healing: Secondary | ICD-10-CM | POA: Diagnosis not present

## 2017-07-24 DIAGNOSIS — G20 Parkinson's disease: Secondary | ICD-10-CM | POA: Diagnosis not present

## 2017-07-24 DIAGNOSIS — H269 Unspecified cataract: Secondary | ICD-10-CM | POA: Diagnosis not present

## 2017-07-24 DIAGNOSIS — S3210XD Unspecified fracture of sacrum, subsequent encounter for fracture with routine healing: Secondary | ICD-10-CM | POA: Diagnosis not present

## 2017-07-24 NOTE — Telephone Encounter (Signed)
I tried to call Whitney Hoffman regarding Whitney Hoffman's PT.  The voicemail answered, but I was not able to leave a message.  I recommended we refer her to are pelvic floor PT.

## 2017-07-24 NOTE — Telephone Encounter (Signed)
Spoke with facility, not Levada Dy, and made aware of pelvic floor therapy here at [facility]. Facility stated they would make family aware and call back with their decision.

## 2017-07-26 NOTE — Telephone Encounter (Signed)
Patient does not need a prior Auth for PTNS with her insurance but idk who will need to get clearance for this I will forward this to [facility] for help with that.  Sharyn Lull

## 2017-07-26 NOTE — Telephone Encounter (Signed)
Whitney Hoffman, The patient does not need prior Auth for PTNS but can you get clearance from her cardiologists for this per Larene Beach it needs to be done prior to her having it. Once that has been done forward back to me and I can make her appointments for PTNS  Thanks, Sharyn Lull

## 2017-07-26 NOTE — Telephone Encounter (Signed)
See previous note

## 2017-07-27 DIAGNOSIS — S3210XD Unspecified fracture of sacrum, subsequent encounter for fracture with routine healing: Secondary | ICD-10-CM | POA: Diagnosis not present

## 2017-07-27 DIAGNOSIS — I10 Essential (primary) hypertension: Secondary | ICD-10-CM | POA: Diagnosis not present

## 2017-07-27 DIAGNOSIS — S32030D Wedge compression fracture of third lumbar vertebra, subsequent encounter for fracture with routine healing: Secondary | ICD-10-CM | POA: Diagnosis not present

## 2017-07-27 DIAGNOSIS — M810 Age-related osteoporosis without current pathological fracture: Secondary | ICD-10-CM | POA: Diagnosis not present

## 2017-07-27 DIAGNOSIS — G20 Parkinson's disease: Secondary | ICD-10-CM | POA: Diagnosis not present

## 2017-07-27 DIAGNOSIS — H269 Unspecified cataract: Secondary | ICD-10-CM | POA: Diagnosis not present

## 2017-07-28 NOTE — Telephone Encounter (Signed)
Clearance faxed

## 2017-08-01 DIAGNOSIS — G20 Parkinson's disease: Secondary | ICD-10-CM | POA: Diagnosis not present

## 2017-08-01 DIAGNOSIS — M810 Age-related osteoporosis without current pathological fracture: Secondary | ICD-10-CM | POA: Diagnosis not present

## 2017-08-01 DIAGNOSIS — S3210XD Unspecified fracture of sacrum, subsequent encounter for fracture with routine healing: Secondary | ICD-10-CM | POA: Diagnosis not present

## 2017-08-01 DIAGNOSIS — H269 Unspecified cataract: Secondary | ICD-10-CM | POA: Diagnosis not present

## 2017-08-01 DIAGNOSIS — I10 Essential (primary) hypertension: Secondary | ICD-10-CM | POA: Diagnosis not present

## 2017-08-01 DIAGNOSIS — S32030D Wedge compression fracture of third lumbar vertebra, subsequent encounter for fracture with routine healing: Secondary | ICD-10-CM | POA: Diagnosis not present

## 2017-08-02 ENCOUNTER — Telehealth: Payer: Self-pay | Admitting: Internal Medicine

## 2017-08-02 NOTE — Telephone Encounter (Signed)
I left a message for staff at [facility] to call- need to make sure the only concern they have is with the patient's pacemaker.

## 2017-08-02 NOTE — Telephone Encounter (Signed)
Call back from the PA at [facility]- she confirms the only thing is to make sure there is no issue with the patient having Percutaneous Nerve Stimulation with her Pacemaker.  I advised her I will review with Dr. Caryl Comes and call them back. She is agreeable.

## 2017-08-02 NOTE — Telephone Encounter (Signed)
° °  [facility] Pre-operative Risk Assessment    Request for surgical clearance:  1. What type of surgery is being performed? PTNS : Percutaneous Tibial Nerve Stimulation   2. When is this surgery scheduled? TBD  3. Are there any medications that need to be held prior to surgery and how long? unknown  4. Practice name and name of physician performing surgery? [facility]  MD unknown   5. What is your office phone and fax number? Tel  [phone] fax [phone]  6. Anesthesia type (None, local, MAC, general) ? Unknown    ___________________________________________   (provider comments below)

## 2017-08-02 NOTE — Telephone Encounter (Signed)
Remind and we can call them   In general, TENS is not recommended inpacemakerdependent patients. It is conceivable that an exception can be made when (1) the TENS is an exceptionally important therapy for that particular patient, (2) robust testing has been performed and safety is confirmed and the therapy is used intermittently. The initial testing required includes live monitoring with TENS activated followed by intermittent Holter monitoring while the patient is using the TENS, to look for pacemaker inhibition. If a TENS unit is to be used, the pacemaker should be programmed as follows: sensing polarity set to bipolar; impedance-based sensors such as minute ventilation should be off. The TENS unit should not use the burst mode. The electrodes should be further away from the CIED but close to each other and in a horizontal (rather than vertical) orientation. High-frequency stimulation (more than 30 Hz) should be maintained at all times. TENS units should be avoided in the thoracic cervical shoulder, upper lumbar, and chest areas due to the proximity of the ICD or PM and lead system.

## 2017-08-03 DIAGNOSIS — M810 Age-related osteoporosis without current pathological fracture: Secondary | ICD-10-CM | POA: Diagnosis not present

## 2017-08-03 DIAGNOSIS — G20 Parkinson's disease: Secondary | ICD-10-CM | POA: Diagnosis not present

## 2017-08-03 DIAGNOSIS — S3210XD Unspecified fracture of sacrum, subsequent encounter for fracture with routine healing: Secondary | ICD-10-CM | POA: Diagnosis not present

## 2017-08-03 DIAGNOSIS — I10 Essential (primary) hypertension: Secondary | ICD-10-CM | POA: Diagnosis not present

## 2017-08-03 DIAGNOSIS — H269 Unspecified cataract: Secondary | ICD-10-CM | POA: Diagnosis not present

## 2017-08-03 DIAGNOSIS — S32030D Wedge compression fracture of third lumbar vertebra, subsequent encounter for fracture with routine healing: Secondary | ICD-10-CM | POA: Diagnosis not present

## 2017-08-03 NOTE — Telephone Encounter (Signed)
Fax received from [place]- patient is scheduled for percutaneous tibial nerve stimulation.  Reviewed further with Dr. Caryl Comes- clearance signed by MD that patient is ok to proceed with this procedure from a pacemaker point of view.  Clearance faxed to [phone]. Confirmation received.

## 2017-08-07 DIAGNOSIS — S32030D Wedge compression fracture of third lumbar vertebra, subsequent encounter for fracture with routine healing: Secondary | ICD-10-CM | POA: Diagnosis not present

## 2017-08-07 DIAGNOSIS — I10 Essential (primary) hypertension: Secondary | ICD-10-CM | POA: Diagnosis not present

## 2017-08-07 DIAGNOSIS — H269 Unspecified cataract: Secondary | ICD-10-CM | POA: Diagnosis not present

## 2017-08-07 DIAGNOSIS — S3210XD Unspecified fracture of sacrum, subsequent encounter for fracture with routine healing: Secondary | ICD-10-CM | POA: Diagnosis not present

## 2017-08-07 DIAGNOSIS — M810 Age-related osteoporosis without current pathological fracture: Secondary | ICD-10-CM | POA: Diagnosis not present

## 2017-08-07 DIAGNOSIS — G20 Parkinson's disease: Secondary | ICD-10-CM | POA: Diagnosis not present

## 2017-08-10 DIAGNOSIS — G20 Parkinson's disease: Secondary | ICD-10-CM | POA: Diagnosis not present

## 2017-08-10 DIAGNOSIS — M810 Age-related osteoporosis without current pathological fracture: Secondary | ICD-10-CM | POA: Diagnosis not present

## 2017-08-10 DIAGNOSIS — S32030D Wedge compression fracture of third lumbar vertebra, subsequent encounter for fracture with routine healing: Secondary | ICD-10-CM | POA: Diagnosis not present

## 2017-08-10 DIAGNOSIS — S3210XD Unspecified fracture of sacrum, subsequent encounter for fracture with routine healing: Secondary | ICD-10-CM | POA: Diagnosis not present

## 2017-08-10 DIAGNOSIS — I10 Essential (primary) hypertension: Secondary | ICD-10-CM | POA: Diagnosis not present

## 2017-08-10 DIAGNOSIS — H269 Unspecified cataract: Secondary | ICD-10-CM | POA: Diagnosis not present

## 2017-08-11 NOTE — Telephone Encounter (Signed)
Please schedule Whitney Hoffman for PTNS.

## 2017-08-14 DIAGNOSIS — S3210XD Unspecified fracture of sacrum, subsequent encounter for fracture with routine healing: Secondary | ICD-10-CM | POA: Diagnosis not present

## 2017-08-14 DIAGNOSIS — S32030D Wedge compression fracture of third lumbar vertebra, subsequent encounter for fracture with routine healing: Secondary | ICD-10-CM | POA: Diagnosis not present

## 2017-08-14 DIAGNOSIS — M810 Age-related osteoporosis without current pathological fracture: Secondary | ICD-10-CM | POA: Diagnosis not present

## 2017-08-14 DIAGNOSIS — G20 Parkinson's disease: Secondary | ICD-10-CM | POA: Diagnosis not present

## 2017-08-14 DIAGNOSIS — H269 Unspecified cataract: Secondary | ICD-10-CM | POA: Diagnosis not present

## 2017-08-14 DIAGNOSIS — I10 Essential (primary) hypertension: Secondary | ICD-10-CM | POA: Diagnosis not present

## 2017-08-17 ENCOUNTER — Telehealth: Payer: Self-pay | Admitting: *Deleted

## 2017-08-17 ENCOUNTER — Telehealth: Payer: Self-pay | Admitting: Urology

## 2017-08-17 DIAGNOSIS — I10 Essential (primary) hypertension: Secondary | ICD-10-CM | POA: Diagnosis not present

## 2017-08-17 DIAGNOSIS — H269 Unspecified cataract: Secondary | ICD-10-CM | POA: Diagnosis not present

## 2017-08-17 DIAGNOSIS — S32030D Wedge compression fracture of third lumbar vertebra, subsequent encounter for fracture with routine healing: Secondary | ICD-10-CM | POA: Diagnosis not present

## 2017-08-17 DIAGNOSIS — S3210XD Unspecified fracture of sacrum, subsequent encounter for fracture with routine healing: Secondary | ICD-10-CM | POA: Diagnosis not present

## 2017-08-17 DIAGNOSIS — M810 Age-related osteoporosis without current pathological fracture: Secondary | ICD-10-CM | POA: Diagnosis not present

## 2017-08-17 DIAGNOSIS — G20 Parkinson's disease: Secondary | ICD-10-CM | POA: Diagnosis not present

## 2017-08-17 NOTE — Telephone Encounter (Signed)
Left message for pt's daughter Narda Rutherford to [facility] to schedule  Sharyn Lull

## 2017-08-17 NOTE — Telephone Encounter (Signed)
Patient's daughter Narda Rutherford called office requesting a written order be sent to [facility] for pt. Patient is having trouble cutting her meats. Narda Rutherford states that if we send written order, pt can have meat cut during food preparation. Order should say "Regular chopped meats". Fax to [facility] [phone] attn: Angie B. Please advise?

## 2017-08-18 NOTE — Telephone Encounter (Signed)
Order was faxed.

## 2017-08-18 NOTE — Telephone Encounter (Signed)
Order written, please fax

## 2017-08-20 DIAGNOSIS — M810 Age-related osteoporosis without current pathological fracture: Secondary | ICD-10-CM | POA: Diagnosis not present

## 2017-08-20 DIAGNOSIS — S32030D Wedge compression fracture of third lumbar vertebra, subsequent encounter for fracture with routine healing: Secondary | ICD-10-CM | POA: Diagnosis not present

## 2017-08-20 DIAGNOSIS — S3210XD Unspecified fracture of sacrum, subsequent encounter for fracture with routine healing: Secondary | ICD-10-CM | POA: Diagnosis not present

## 2017-08-20 DIAGNOSIS — H269 Unspecified cataract: Secondary | ICD-10-CM | POA: Diagnosis not present

## 2017-08-20 DIAGNOSIS — H3530 Unspecified macular degeneration: Secondary | ICD-10-CM | POA: Diagnosis not present

## 2017-08-20 DIAGNOSIS — G20 Parkinson's disease: Secondary | ICD-10-CM | POA: Diagnosis not present

## 2017-08-20 DIAGNOSIS — I10 Essential (primary) hypertension: Secondary | ICD-10-CM | POA: Diagnosis not present

## 2017-08-23 ENCOUNTER — Ambulatory Visit (INDEPENDENT_AMBULATORY_CARE_PROVIDER_SITE_OTHER): Payer: Medicare Other | Admitting: *Deleted

## 2017-08-23 ENCOUNTER — Telehealth: Payer: Self-pay | Admitting: Cardiology

## 2017-08-23 DIAGNOSIS — I495 Sick sinus syndrome: Secondary | ICD-10-CM

## 2017-08-23 NOTE — Telephone Encounter (Signed)
LMOVM reminding pt to send remote transmission.   

## 2017-08-24 DIAGNOSIS — G20 Parkinson's disease: Secondary | ICD-10-CM | POA: Diagnosis not present

## 2017-08-24 DIAGNOSIS — S3210XD Unspecified fracture of sacrum, subsequent encounter for fracture with routine healing: Secondary | ICD-10-CM | POA: Diagnosis not present

## 2017-08-24 DIAGNOSIS — H269 Unspecified cataract: Secondary | ICD-10-CM | POA: Diagnosis not present

## 2017-08-24 DIAGNOSIS — S32030D Wedge compression fracture of third lumbar vertebra, subsequent encounter for fracture with routine healing: Secondary | ICD-10-CM | POA: Diagnosis not present

## 2017-08-24 DIAGNOSIS — M810 Age-related osteoporosis without current pathological fracture: Secondary | ICD-10-CM | POA: Diagnosis not present

## 2017-08-24 DIAGNOSIS — I10 Essential (primary) hypertension: Secondary | ICD-10-CM | POA: Diagnosis not present

## 2017-08-24 NOTE — Progress Notes (Signed)
Remote pacemaker transmission.   

## 2017-08-25 ENCOUNTER — Encounter: Payer: Self-pay | Admitting: Cardiology

## 2017-08-25 DIAGNOSIS — M810 Age-related osteoporosis without current pathological fracture: Secondary | ICD-10-CM | POA: Diagnosis not present

## 2017-08-25 DIAGNOSIS — S32030D Wedge compression fracture of third lumbar vertebra, subsequent encounter for fracture with routine healing: Secondary | ICD-10-CM | POA: Diagnosis not present

## 2017-08-25 DIAGNOSIS — S3210XD Unspecified fracture of sacrum, subsequent encounter for fracture with routine healing: Secondary | ICD-10-CM | POA: Diagnosis not present

## 2017-08-25 DIAGNOSIS — G20 Parkinson's disease: Secondary | ICD-10-CM | POA: Diagnosis not present

## 2017-08-25 DIAGNOSIS — H269 Unspecified cataract: Secondary | ICD-10-CM | POA: Diagnosis not present

## 2017-08-25 DIAGNOSIS — I10 Essential (primary) hypertension: Secondary | ICD-10-CM | POA: Diagnosis not present

## 2017-08-28 DIAGNOSIS — I10 Essential (primary) hypertension: Secondary | ICD-10-CM | POA: Diagnosis not present

## 2017-08-28 DIAGNOSIS — S3210XD Unspecified fracture of sacrum, subsequent encounter for fracture with routine healing: Secondary | ICD-10-CM | POA: Diagnosis not present

## 2017-08-28 DIAGNOSIS — G20 Parkinson's disease: Secondary | ICD-10-CM | POA: Diagnosis not present

## 2017-08-28 DIAGNOSIS — H269 Unspecified cataract: Secondary | ICD-10-CM | POA: Diagnosis not present

## 2017-08-28 DIAGNOSIS — S32030D Wedge compression fracture of third lumbar vertebra, subsequent encounter for fracture with routine healing: Secondary | ICD-10-CM | POA: Diagnosis not present

## 2017-08-28 DIAGNOSIS — M810 Age-related osteoporosis without current pathological fracture: Secondary | ICD-10-CM | POA: Diagnosis not present

## 2017-08-28 NOTE — Telephone Encounter (Signed)
Spoke with Whitney Hoffman and she said she would call back later to schedule app for PTNS for her mom.  Sharyn Lull

## 2017-08-29 NOTE — Telephone Encounter (Signed)
Patient has been scheduled ° ° °Whitney Hoffman °

## 2017-08-31 DIAGNOSIS — I10 Essential (primary) hypertension: Secondary | ICD-10-CM | POA: Diagnosis not present

## 2017-08-31 DIAGNOSIS — S32030D Wedge compression fracture of third lumbar vertebra, subsequent encounter for fracture with routine healing: Secondary | ICD-10-CM | POA: Diagnosis not present

## 2017-08-31 DIAGNOSIS — G20 Parkinson's disease: Secondary | ICD-10-CM | POA: Diagnosis not present

## 2017-08-31 DIAGNOSIS — S3210XD Unspecified fracture of sacrum, subsequent encounter for fracture with routine healing: Secondary | ICD-10-CM | POA: Diagnosis not present

## 2017-08-31 DIAGNOSIS — H269 Unspecified cataract: Secondary | ICD-10-CM | POA: Diagnosis not present

## 2017-08-31 DIAGNOSIS — M810 Age-related osteoporosis without current pathological fracture: Secondary | ICD-10-CM | POA: Diagnosis not present

## 2017-08-31 LAB — CUP PACEART REMOTE DEVICE CHECK
Battery Impedance: 960 Ohm
Battery Remaining Longevity: 67 mo
Battery Voltage: 2.78 V
Brady Statistic RV Percent Paced: 19 %
Date Time Interrogation Session: 20190102215309
Implantable Lead Implant Date: 20111108
Implantable Lead Location: 753860
Implantable Lead Model: 5076
Lead Channel Setting Pacing Amplitude: 2.5 V
Lead Channel Setting Pacing Pulse Width: 0.4 ms
Lead Channel Setting Sensing Sensitivity: 4 mV
MDC IDC LEAD IMPLANT DT: 20111108
MDC IDC LEAD LOCATION: 753859
MDC IDC MSMT LEADCHNL RA IMPEDANCE VALUE: 67 Ohm
MDC IDC MSMT LEADCHNL RV IMPEDANCE VALUE: 540 Ohm
MDC IDC MSMT LEADCHNL RV PACING THRESHOLD AMPLITUDE: 0.875 V
MDC IDC MSMT LEADCHNL RV PACING THRESHOLD PULSEWIDTH: 0.4 ms
MDC IDC PG IMPLANT DT: 20111108

## 2017-09-04 DIAGNOSIS — S32030D Wedge compression fracture of third lumbar vertebra, subsequent encounter for fracture with routine healing: Secondary | ICD-10-CM | POA: Diagnosis not present

## 2017-09-04 DIAGNOSIS — H269 Unspecified cataract: Secondary | ICD-10-CM | POA: Diagnosis not present

## 2017-09-04 DIAGNOSIS — G20 Parkinson's disease: Secondary | ICD-10-CM | POA: Diagnosis not present

## 2017-09-04 DIAGNOSIS — I10 Essential (primary) hypertension: Secondary | ICD-10-CM | POA: Diagnosis not present

## 2017-09-04 DIAGNOSIS — S3210XD Unspecified fracture of sacrum, subsequent encounter for fracture with routine healing: Secondary | ICD-10-CM | POA: Diagnosis not present

## 2017-09-04 DIAGNOSIS — M810 Age-related osteoporosis without current pathological fracture: Secondary | ICD-10-CM | POA: Diagnosis not present

## 2017-09-07 DIAGNOSIS — I10 Essential (primary) hypertension: Secondary | ICD-10-CM | POA: Diagnosis not present

## 2017-09-07 DIAGNOSIS — S3210XD Unspecified fracture of sacrum, subsequent encounter for fracture with routine healing: Secondary | ICD-10-CM | POA: Diagnosis not present

## 2017-09-07 DIAGNOSIS — G20 Parkinson's disease: Secondary | ICD-10-CM | POA: Diagnosis not present

## 2017-09-07 DIAGNOSIS — S32030D Wedge compression fracture of third lumbar vertebra, subsequent encounter for fracture with routine healing: Secondary | ICD-10-CM | POA: Diagnosis not present

## 2017-09-07 DIAGNOSIS — M810 Age-related osteoporosis without current pathological fracture: Secondary | ICD-10-CM | POA: Diagnosis not present

## 2017-09-07 DIAGNOSIS — H269 Unspecified cataract: Secondary | ICD-10-CM | POA: Diagnosis not present

## 2017-09-11 DIAGNOSIS — I10 Essential (primary) hypertension: Secondary | ICD-10-CM | POA: Diagnosis not present

## 2017-09-11 DIAGNOSIS — H269 Unspecified cataract: Secondary | ICD-10-CM | POA: Diagnosis not present

## 2017-09-11 DIAGNOSIS — S32030D Wedge compression fracture of third lumbar vertebra, subsequent encounter for fracture with routine healing: Secondary | ICD-10-CM | POA: Diagnosis not present

## 2017-09-11 DIAGNOSIS — G20 Parkinson's disease: Secondary | ICD-10-CM | POA: Diagnosis not present

## 2017-09-11 DIAGNOSIS — S3210XD Unspecified fracture of sacrum, subsequent encounter for fracture with routine healing: Secondary | ICD-10-CM | POA: Diagnosis not present

## 2017-09-11 DIAGNOSIS — M810 Age-related osteoporosis without current pathological fracture: Secondary | ICD-10-CM | POA: Diagnosis not present

## 2017-09-14 DIAGNOSIS — H269 Unspecified cataract: Secondary | ICD-10-CM | POA: Diagnosis not present

## 2017-09-14 DIAGNOSIS — S32030D Wedge compression fracture of third lumbar vertebra, subsequent encounter for fracture with routine healing: Secondary | ICD-10-CM | POA: Diagnosis not present

## 2017-09-14 DIAGNOSIS — I10 Essential (primary) hypertension: Secondary | ICD-10-CM | POA: Diagnosis not present

## 2017-09-14 DIAGNOSIS — S3210XD Unspecified fracture of sacrum, subsequent encounter for fracture with routine healing: Secondary | ICD-10-CM | POA: Diagnosis not present

## 2017-09-14 DIAGNOSIS — M810 Age-related osteoporosis without current pathological fracture: Secondary | ICD-10-CM | POA: Diagnosis not present

## 2017-09-14 DIAGNOSIS — G20 Parkinson's disease: Secondary | ICD-10-CM | POA: Diagnosis not present

## 2017-09-18 ENCOUNTER — Ambulatory Visit (INDEPENDENT_AMBULATORY_CARE_PROVIDER_SITE_OTHER): Payer: Medicare Other | Admitting: Urology

## 2017-09-18 DIAGNOSIS — S3210XD Unspecified fracture of sacrum, subsequent encounter for fracture with routine healing: Secondary | ICD-10-CM | POA: Diagnosis not present

## 2017-09-18 DIAGNOSIS — N3946 Mixed incontinence: Secondary | ICD-10-CM

## 2017-09-18 DIAGNOSIS — S32030D Wedge compression fracture of third lumbar vertebra, subsequent encounter for fracture with routine healing: Secondary | ICD-10-CM | POA: Diagnosis not present

## 2017-09-18 DIAGNOSIS — H269 Unspecified cataract: Secondary | ICD-10-CM | POA: Diagnosis not present

## 2017-09-18 DIAGNOSIS — M810 Age-related osteoporosis without current pathological fracture: Secondary | ICD-10-CM | POA: Diagnosis not present

## 2017-09-18 DIAGNOSIS — G20 Parkinson's disease: Secondary | ICD-10-CM | POA: Diagnosis not present

## 2017-09-18 DIAGNOSIS — I10 Essential (primary) hypertension: Secondary | ICD-10-CM | POA: Diagnosis not present

## 2017-09-18 NOTE — Progress Notes (Signed)
PTNS  Session # 1  Health & Social Factors: no change Caffeine: 2 Alcohol: 0 Daytime voids #per day: 8-10 Night-time voids #per night: 2 Urgency: strong Incontinence Episodes #per day: 3-4 Ankle used: left Treatment Setting: 9 Feeling/ Response: both Comments: patient tolerated well  Preformed By: Elberta Leatherwood, CMA   Follow Up: 1 week #2

## 2017-09-19 DIAGNOSIS — S3210XD Unspecified fracture of sacrum, subsequent encounter for fracture with routine healing: Secondary | ICD-10-CM | POA: Diagnosis not present

## 2017-09-19 DIAGNOSIS — H269 Unspecified cataract: Secondary | ICD-10-CM | POA: Diagnosis not present

## 2017-09-19 DIAGNOSIS — G20 Parkinson's disease: Secondary | ICD-10-CM | POA: Diagnosis not present

## 2017-09-19 DIAGNOSIS — S32030D Wedge compression fracture of third lumbar vertebra, subsequent encounter for fracture with routine healing: Secondary | ICD-10-CM | POA: Diagnosis not present

## 2017-09-19 DIAGNOSIS — M810 Age-related osteoporosis without current pathological fracture: Secondary | ICD-10-CM | POA: Diagnosis not present

## 2017-09-19 DIAGNOSIS — I10 Essential (primary) hypertension: Secondary | ICD-10-CM | POA: Diagnosis not present

## 2017-09-20 ENCOUNTER — Encounter: Payer: Self-pay | Admitting: Urology

## 2017-09-25 ENCOUNTER — Ambulatory Visit: Payer: Medicare Other

## 2017-10-02 ENCOUNTER — Ambulatory Visit (INDEPENDENT_AMBULATORY_CARE_PROVIDER_SITE_OTHER): Payer: Medicare Other | Admitting: Family Medicine

## 2017-10-02 DIAGNOSIS — N3946 Mixed incontinence: Secondary | ICD-10-CM

## 2017-10-02 NOTE — Progress Notes (Signed)
PTNS  Session # 2  Health & Social Factors: no change Caffeine: 1 Alcohol: 0 Daytime voids #per day: 6 Night-time voids #per night: 3 Urgency: strong Incontinence Episodes #per day: 3 Ankle used: left Treatment Setting: 9 Feeling/ Response: sensory Comments: Patient tolerated well  Preformed By: Elberta Leatherwood, CMA   Follow Up: 1 week # 3

## 2017-10-09 ENCOUNTER — Ambulatory Visit (INDEPENDENT_AMBULATORY_CARE_PROVIDER_SITE_OTHER): Payer: Medicare Other

## 2017-10-09 DIAGNOSIS — R32 Unspecified urinary incontinence: Secondary | ICD-10-CM | POA: Diagnosis not present

## 2017-10-09 NOTE — Progress Notes (Signed)
PTNS  Session # 3  Health & Social Factors: no change Caffeine: 1-2 Alcohol: 0 Daytime voids #per day: 4-5 Night-time voids #per night: 3 Urgency: strong Incontinence Episodes #per day: 2-3 Ankle used: left Treatment Setting: 19 Feeling/ Response: both Comments: patient had significant swelling noted in both ankles at today's visit and did seem to make it more difficult for her to feel sensation  Preformed By: Fonnie Jarvis, CMA    Follow Up: 1 week

## 2017-10-16 ENCOUNTER — Telehealth: Payer: Self-pay

## 2017-10-16 ENCOUNTER — Ambulatory Visit (INDEPENDENT_AMBULATORY_CARE_PROVIDER_SITE_OTHER): Payer: Medicare Other | Admitting: Urology

## 2017-10-16 DIAGNOSIS — N3941 Urge incontinence: Secondary | ICD-10-CM | POA: Diagnosis not present

## 2017-10-16 NOTE — Telephone Encounter (Signed)
Called pt to schedule AWV and daughter was driving and would like a CB tomorrow, 10/17/17 anytime after 1 PM.

## 2017-10-17 NOTE — Progress Notes (Signed)
PTNS  Session # 4  Health & Social Factors: No change Caffeine: 2 Alcohol: 0 Daytime voids #per day: 4 Night-time voids #per night: 4 Urgency: strong Incontinence Episodes #per day: 3 Ankle used: left Treatment Setting: 19 Feeling/ Response: sensory Comments:   Preformed By: Toniann Fail, LPN  Follow Up: 1 week

## 2017-10-18 ENCOUNTER — Other Ambulatory Visit: Payer: Self-pay | Admitting: Urology

## 2017-10-18 NOTE — Telephone Encounter (Signed)
Scheduled AWV and f/u to AWV on 12/01/17 @ 1:20 and 2:00 PM.  -MM

## 2017-10-23 ENCOUNTER — Ambulatory Visit (INDEPENDENT_AMBULATORY_CARE_PROVIDER_SITE_OTHER): Payer: Medicare Other

## 2017-10-23 DIAGNOSIS — N3941 Urge incontinence: Secondary | ICD-10-CM

## 2017-10-23 NOTE — Progress Notes (Signed)
PTNS  Session # 5  Health & Social Factors: Change Caffeine: 2 Alcohol: 0 Daytime voids #per day: 3 Night-time voids #per night: 4 Urgency: mild Incontinence Episodes #per day: 4 Ankle used: left Treatment Setting: 9 Feeling/ Response: sensory Comments:   Preformed By: Toniann Fail, LPN  Follow Up: 1 week

## 2017-10-25 DIAGNOSIS — H3554 Dystrophies primarily involving the retinal pigment epithelium: Secondary | ICD-10-CM | POA: Diagnosis not present

## 2017-10-30 ENCOUNTER — Ambulatory Visit (INDEPENDENT_AMBULATORY_CARE_PROVIDER_SITE_OTHER): Payer: Medicare Other | Admitting: Urology

## 2017-10-30 DIAGNOSIS — R32 Unspecified urinary incontinence: Secondary | ICD-10-CM

## 2017-10-30 NOTE — Progress Notes (Signed)
PTNS  Session # 6  Health & Social Factors: same Caffeine: 2-3 Alcohol: 0 Daytime voids #per day: 5 Night-time voids #per night: 3-4 Urgency: strong Incontinence Episodes #per day: 3-4 Ankle used: left Treatment Setting: 15 Feeling/ Response: both Comments: none  Preformed By: Judson Roch watts, CMA    Follow Up: next week

## 2017-11-06 ENCOUNTER — Ambulatory Visit (INDEPENDENT_AMBULATORY_CARE_PROVIDER_SITE_OTHER): Payer: Medicare Other

## 2017-11-06 DIAGNOSIS — N3941 Urge incontinence: Secondary | ICD-10-CM | POA: Diagnosis not present

## 2017-11-06 NOTE — Progress Notes (Signed)
PTNS  Session # 7  Health & Social Factors: No change Caffeine: 2-3 Alcohol: 0 Daytime voids #per day: 5 Night-time voids #per night: 3-4 Urgency: strong Incontinence Episodes #per day: 3 Ankle used: left Treatment Setting: 19 Feeling/ Response: toe flex Comments:   Preformed By: Toniann Fail, LPN  Follow Up: 1 week

## 2017-11-13 ENCOUNTER — Ambulatory Visit (INDEPENDENT_AMBULATORY_CARE_PROVIDER_SITE_OTHER): Payer: Medicare Other | Admitting: Urology

## 2017-11-13 DIAGNOSIS — N3941 Urge incontinence: Secondary | ICD-10-CM

## 2017-11-13 NOTE — Progress Notes (Signed)
PTNS  Session # 8  Health & Social Factors: no change Caffeine: 2-3 Alcohol: 0 Daytime voids #per day: 5 Night-time voids #per night: 3-4 Urgency: strong Incontinence Episodes #per day: 1 Ankle used: left Treatment Setting: 7 Feeling/ Response: toe flex Comments: none  Preformed By: Fonnie Jarvis, CMA   Follow Up: 1 week

## 2017-11-20 ENCOUNTER — Ambulatory Visit (INDEPENDENT_AMBULATORY_CARE_PROVIDER_SITE_OTHER): Payer: Medicare Other | Admitting: Urology

## 2017-11-20 DIAGNOSIS — N3941 Urge incontinence: Secondary | ICD-10-CM | POA: Diagnosis not present

## 2017-11-20 NOTE — Progress Notes (Signed)
PTNS  Session # 9  Health & Social Factors: urge Incontinence Caffeine: 2 Alcohol: 0 Daytime voids #per day: 4-5 Night-time voids #per night: 3-4 Urgency: strong Incontinence Episodes #per day: 2-3 Ankle used: left Treatment Setting: 12 Feeling/ Response: toe flex Comments: none  Preformed By: Fonnie Jarvis, CMA    Follow Up: next week

## 2017-11-22 ENCOUNTER — Telehealth: Payer: Self-pay | Admitting: Cardiology

## 2017-11-22 ENCOUNTER — Ambulatory Visit (INDEPENDENT_AMBULATORY_CARE_PROVIDER_SITE_OTHER): Payer: Medicare Other | Admitting: *Deleted

## 2017-11-22 DIAGNOSIS — I495 Sick sinus syndrome: Secondary | ICD-10-CM

## 2017-11-22 NOTE — Progress Notes (Signed)
Remote pacemaker transmission.   

## 2017-11-22 NOTE — Telephone Encounter (Signed)
Confirmed remote transmission w/ pt daughter.   

## 2017-11-23 ENCOUNTER — Encounter: Payer: Self-pay | Admitting: Cardiology

## 2017-11-27 ENCOUNTER — Ambulatory Visit (INDEPENDENT_AMBULATORY_CARE_PROVIDER_SITE_OTHER): Payer: Medicare Other

## 2017-11-27 DIAGNOSIS — R32 Unspecified urinary incontinence: Secondary | ICD-10-CM

## 2017-11-27 NOTE — Progress Notes (Signed)
PTNS  Session # 10  Health & Social Factors: No change Caffeine: 2 Alcohol: 0 Daytime voids #per day: 5 Night-time voids #per night: 4 Urgency: strong Incontinence Episodes #per day: 2 Ankle used: right Treatment Setting: 10 Feeling/ Response: sensory Comments:   Preformed By: Toniann Fail, LPN

## 2017-11-30 ENCOUNTER — Telehealth: Payer: Self-pay

## 2017-11-30 NOTE — Telephone Encounter (Signed)
Per chart- pt had a CPE on 11/30/16 and an AWV on 12/21/16. With traditional medicare pt needs to be 366 days out from the LAST visit that used the wellness code [phone]). That would have been on 12/21/16. Need to reschedule AWV and CPE from 12/01/17 to after 12/21/17. Spoke with daughter and apologized and explained the situation. Daughter stated understanding and said she needed to check her calender first and would call us back to r/s the AWV and CPE. Both apts cancelled for tomorrow, 12/01/17. -MM

## 2017-12-01 ENCOUNTER — Encounter: Payer: Self-pay | Admitting: Family Medicine

## 2017-12-01 ENCOUNTER — Ambulatory Visit: Payer: Self-pay

## 2017-12-04 ENCOUNTER — Ambulatory Visit (INDEPENDENT_AMBULATORY_CARE_PROVIDER_SITE_OTHER): Payer: Medicare Other | Admitting: Family Medicine

## 2017-12-04 DIAGNOSIS — R32 Unspecified urinary incontinence: Secondary | ICD-10-CM

## 2017-12-04 NOTE — Progress Notes (Signed)
PTNS  Session # 11  Health & Social Factors: no change Caffeine: 2 Alcohol: 0 Daytime voids #per day: 5 Night-time voids #per night: 4 Urgency: strong Incontinence Episodes #per day: 3 Ankle used: left Treatment Setting: 14 Feeling/ Response: both Comments: Patient tolerated well  Preformed By: Elberta Leatherwood, CMA   Follow Up: 1 week # 12

## 2017-12-05 ENCOUNTER — Encounter: Payer: Self-pay | Admitting: Internal Medicine

## 2017-12-05 ENCOUNTER — Ambulatory Visit (INDEPENDENT_AMBULATORY_CARE_PROVIDER_SITE_OTHER): Payer: Medicare Other | Admitting: Internal Medicine

## 2017-12-05 VITALS — BP 122/76 | HR 79 | Ht 61.0 in | Wt 148.0 lb

## 2017-12-05 DIAGNOSIS — Z950 Presence of cardiac pacemaker: Secondary | ICD-10-CM

## 2017-12-05 DIAGNOSIS — I495 Sick sinus syndrome: Secondary | ICD-10-CM | POA: Diagnosis not present

## 2017-12-05 DIAGNOSIS — I482 Chronic atrial fibrillation: Secondary | ICD-10-CM | POA: Diagnosis not present

## 2017-12-05 DIAGNOSIS — I4821 Permanent atrial fibrillation: Secondary | ICD-10-CM

## 2017-12-05 LAB — CUP PACEART REMOTE DEVICE CHECK
Battery Impedance: 1066 Ohm
Battery Voltage: 2.78 V
Implantable Lead Implant Date: 20111108
Implantable Lead Location: 753859
Implantable Lead Model: 5076
Implantable Pulse Generator Implant Date: 20111108
Lead Channel Impedance Value: 554 Ohm
Lead Channel Pacing Threshold Amplitude: 0.875 V
Lead Channel Pacing Threshold Pulse Width: 0.4 ms
Lead Channel Setting Sensing Sensitivity: 4 mV
MDC IDC LEAD IMPLANT DT: 20111108
MDC IDC LEAD LOCATION: 753860
MDC IDC MSMT BATTERY REMAINING LONGEVITY: 62 mo
MDC IDC MSMT LEADCHNL RA IMPEDANCE VALUE: 67 Ohm
MDC IDC SESS DTM: 20190403201029
MDC IDC SET LEADCHNL RV PACING AMPLITUDE: 2.5 V
MDC IDC SET LEADCHNL RV PACING PULSEWIDTH: 0.4 ms
MDC IDC STAT BRADY RV PERCENT PACED: 19 %

## 2017-12-05 NOTE — Patient Instructions (Addendum)
Medication Instructions: - Your physician recommends that you continue on your current medications as directed. Please refer to the Current Medication list given to you today.  Labwork: - Your physician recommends that you have lab work today: BMP/ CBC  Procedures/Testing: - none ordered  Follow-Up: - Remote monitoring is used to monitor your Pacemaker of ICD from home. This monitoring reduces the number of office visits required to check your device to one time per year. It allows [place] to keep an eye on the functioning of your device to ensure it is working properly. You are scheduled for a device check from home on 02/21/18. You may send your transmission at any time that day. If you have a wireless device, the transmission will be sent automatically. After your physician reviews your transmission, you will receive a postcard with your next transmission date.  - Your physician wants you to follow-up in: 1 year with Dr. Caryl Comes. You will receive a reminder letter in the mail two months in advance. If you don't receive a letter, please call our office to schedule the follow-up appointment.   Any Additional Special Instructions Will Be Listed Below (If Applicable).     If you need a refill on your cardiac medications before your next appointment, please call your pharmacy.

## 2017-12-05 NOTE — Progress Notes (Signed)
Patient Care Team: Birdie Sons, MD as PCP - General (Family Medicine) Deboraha Sprang, MD as Consulting Physician (Cardiology) Vladimir Crofts, MD (Neurology)   HPI  Whitney Hoffman is a 84 y.o. female Seen in followup for tachybradycardia syndrome with prior pacemaker insertion. Persistent AFib on Rivaroxaban   Date Cr Hgb  9/18 0.65 14        She has chronic sob and some ankle but no pretibial swelling  parkinsons is progressive and now at ass[facility] living   No bleeding      Past Medical History:  Diagnosis Date  . Atrial fibrillation ([place])   . Parkinson disease ([place])   . Tachy-brady syndrome ([place])    with medtronic dual chambe rPCM    Past Surgical History:  Procedure Laterality Date  . ABDOMINAL HYSTERECTOMY  1966   fibroids ovaries removed secondary to ovarian cyst  . APPENDECTOMY  1944  . baldder tack     with her hysterectomy  . BREAST BIOPSY  2010   left benign repeat ultrasound recommneded in 2011 [facility]    . CHOLECYSTECTOMY  1969  . GALLBLADDER SURGERY    . HIP SURGERY Left    ORIF x 2; post op infection Left hip  . PACEMAKER PLACEMENT  06/29/2010  . WRIST SURGERY  1996   Traumatic B    Current Outpatient Medications  Medication Sig Dispense Refill  . Calcium Carbonate-Vitamin D (CALCIUM 600+D HIGH POTENCY PO) Take 1 tablet 2 (two) times daily by mouth.    . cetirizine (ZYRTEC) 10 MG tablet Take 1 tablet (10 mg total) by mouth daily. 30 tablet 11  . Cholecalciferol (VITAMIN D-3) 5000 units TABS Take 1 tablet by mouth daily.    . Cyanocobalamin (VITAMIN B-12) 5000 MCG SUBL Place 1 tablet daily under the tongue.     . docusate sodium (COLACE) 100 MG capsule One capsule every morning and one capsule every evening 60 capsule 12  . ezetimibe (ZETIA) 10 MG tablet Take 1 tablet (10 mg total) by mouth daily. 30 tablet 5  . loperamide (IMODIUM A-D) 2 MG tablet One tablet by mouth as needed for diarhea up to 3  tablets in 24 hours 30 tablet 12  . metoprolol (LOPRESSOR) 50 MG tablet TAKE 2 TABLETS BY MOUTH ONCE EVERY MORNING AND TAKE 1 TABLET BY MOUTHEVERYEVENING 90 tablet 3  . Milk Thistle 175 MG CAPS Take by mouth.    . mirabegron ER (MYRBETRIQ) 25 MG TB24 tablet Take 1 tablet (25 mg total) by mouth daily. 30 tablet 11  . polyethylene glycol powder (GLYCOLAX/MIRALAX) powder Take 17 g 2 (two) times daily as needed by mouth. 3350 g 5  . PREMARIN vaginal cream APPLY 1 APPLICATOR VAGINALLY 3 NIGHTS A WEEK 30 g PRN  . Rivaroxaban (XARELTO) 15 MG TABS tablet Take 1 tablet (15 mg total) by mouth daily with supper. 30 tablet 6   No current facility-administered medications for this visit.     Allergies  Allergen Reactions  . Codeine     Feel dizzy  . Meperidine Hcl     Sick on stomach   . Morphine     Nausea/sick on stomach  . Propoxyphene   . Aspirin Nausea Only    Sick on stomach  . Diazepam Nausea Only    Sick on stomach  . Meperidine Nausea Only  . Oxycodone-Acetaminophen Nausea Only    Review of Systems negative except from  HPI and PMH  Physical Exam BP 122/76 (BP Location: Left Arm, Patient Position: Sitting, Cuff Size: Normal)   Pulse 79   Ht 5\' 1"  (1.549 m)   Wt 148 lb (67.1 kg)   BMI 27.96 kg/m  Well developed and nourished in no acute distress HENT normal Neck supple   Clear Regular rate and rhythm, no murmurs or gallops Abd-soft with active BS No Clubbing cyanosis edema Skin-warm and dry A & Oriented  Grossly normal sensory and motor function   ECG personally reviewed atrial fib with occ pacing  Assessment and  Plan  HFpEF  Permanent atrial fib  Pacemaker Medtronic The patient's device was interrogated.  The information was reviewed. Device was reprogrammed [id number]  Orthostatic intolerance   parkinsons disease  On Anticoagulation;  No bleeding issues \  Discussed importance of exercise  Fluid status is stable

## 2017-12-06 ENCOUNTER — Ambulatory Visit: Payer: Self-pay

## 2017-12-06 LAB — BASIC METABOLIC PANEL
BUN/Creatinine Ratio: 30 — ABNORMAL HIGH (ref 12–28)
BUN: 25 mg/dL (ref 10–36)
CALCIUM: 9.8 mg/dL (ref 8.7–10.3)
CO2: 28 mmol/L (ref 20–29)
Chloride: 99 mmol/L (ref 96–106)
Creatinine, Ser: 0.84 mg/dL (ref 0.57–1.00)
GFR calc Af Amer: 70 mL/min/{1.73_m2} (ref >59)
GFR, EST NON AFRICAN AMERICAN: 61 mL/min/{1.73_m2} (ref >59)
Glucose: 94 mg/dL (ref 65–99)
Potassium: 4.1 mmol/L (ref 3.5–5.2)
Sodium: 141 mmol/L (ref 134–144)

## 2017-12-06 LAB — CBC WITH DIFFERENTIAL/PLATELET
BASOS ABS: 0 10*3/uL (ref 0.0–0.2)
Basos: 0 %
EOS (ABSOLUTE): 0.1 10*3/uL (ref 0.0–0.4)
Eos: 1 %
HEMOGLOBIN: 14.4 g/dL (ref 11.1–15.9)
Hematocrit: 41.9 % (ref 34.0–46.6)
IMMATURE GRANS (ABS): 0 10*3/uL (ref 0.0–0.1)
Immature Granulocytes: 0 %
LYMPHS: 29 %
Lymphocytes Absolute: 2 10*3/uL (ref 0.7–3.1)
MCH: 30.1 pg (ref 26.6–33.0)
MCHC: 34.4 g/dL (ref 31.5–35.7)
MCV: 88 fL (ref 79–97)
MONOCYTES: 8 %
Monocytes Absolute: 0.5 10*3/uL (ref 0.1–0.9)
NEUTROS ABS: 4.1 10*3/uL (ref 1.4–7.0)
Neutrophils: 62 %
Platelets: 272 10*3/uL (ref 150–379)
RBC: 4.79 x10E6/uL (ref 3.77–5.28)
RDW: 14.2 % (ref 12.3–15.4)
WBC: 6.7 10*3/uL (ref 3.4–10.8)

## 2017-12-11 ENCOUNTER — Ambulatory Visit (INDEPENDENT_AMBULATORY_CARE_PROVIDER_SITE_OTHER): Payer: Medicare Other

## 2017-12-11 DIAGNOSIS — N3941 Urge incontinence: Secondary | ICD-10-CM | POA: Diagnosis not present

## 2017-12-11 NOTE — Progress Notes (Signed)
PTNS  Session # 12  Health & Social Factors: same Caffeine: 2 Alcohol: 0 Daytime voids #per day: 4-5 Night-time voids #per night: 4 Urgency: mild Incontinence Episodes #per day: 2 Ankle used: right Treatment Setting: 12 Feeling/ Response: both Comments: none  Preformed By: Fonnie Jarvis, CMA  Follow Up: 1  Month w/Shannon to discuss other treatment options

## 2017-12-18 NOTE — Telephone Encounter (Signed)
Scheduled for 01/09/18 @ 1 and 2 PM.

## 2017-12-20 LAB — CUP PACEART INCLINIC DEVICE CHECK
Date Time Interrogation Session: 20190501094735
Implantable Lead Implant Date: 20111108
Implantable Lead Location: 753860
Implantable Lead Model: 5076
Implantable Pulse Generator Implant Date: 20111108
MDC IDC LEAD IMPLANT DT: 20111108
MDC IDC LEAD LOCATION: 753859

## 2017-12-25 ENCOUNTER — Ambulatory Visit (INDEPENDENT_AMBULATORY_CARE_PROVIDER_SITE_OTHER): Payer: Medicare Other | Admitting: Family Medicine

## 2017-12-25 ENCOUNTER — Encounter: Payer: Self-pay | Admitting: Family Medicine

## 2017-12-25 VITALS — BP 124/74 | HR 91 | Temp 98.4°F | Resp 16 | Ht 67.0 in | Wt 144.0 lb

## 2017-12-25 DIAGNOSIS — J301 Allergic rhinitis due to pollen: Secondary | ICD-10-CM

## 2017-12-25 DIAGNOSIS — J40 Bronchitis, not specified as acute or chronic: Secondary | ICD-10-CM | POA: Diagnosis not present

## 2017-12-25 MED ORDER — AZITHROMYCIN 250 MG PO TABS: ORAL_TABLET | ORAL | 0 refills | Status: AC

## 2017-12-25 MED ORDER — AZITHROMYCIN 250 MG PO TABS: ORAL_TABLET | ORAL | 0 refills | Status: DC

## 2017-12-25 MED ORDER — CETIRIZINE HCL 10 MG PO TABS: 10.0000 mg | ORAL_TABLET | Freq: Every day | ORAL | 5 refills | Status: DC | PRN

## 2017-12-25 NOTE — Progress Notes (Signed)
Patient: Whitney Hoffman Female    DOB: Aug 23, 1923   84 y.o.   MRN: [id number] Visit Date: 12/25/2017  Today's Provider: Lelon Huh, MD   Chief Complaint  Patient presents with  . Cough   Subjective:    Patient has had cough and congestion for 1 week. Cough is slightly productive. Also has symptoms of nasal congestion, drainage, eye redness and eye drainage. No feveror shortness of breath. Patient has been taking otc Robitussin with mild relief.   Cough  This is a new problem. The current episode started in the past 7 days. The problem has been unchanged. The cough is productive of sputum. Associated symptoms include eye redness and nasal congestion. Pertinent negatives include no chest pain, chills, ear congestion, ear pain, fever, headaches, heartburn, hemoptysis, myalgias, postnasal drip, rash, rhinorrhea, sore throat, shortness of breath, sweats, weight loss or wheezing. She has tried OTC cough suppressant for the symptoms. The treatment provided mild relief.       Allergies  Allergen Reactions  . Codeine     Feel dizzy  . Meperidine Hcl     Sick on stomach   . Morphine     Nausea/sick on stomach  . Propoxyphene   . Aspirin Nausea Only    Sick on stomach  . Diazepam Nausea Only    Sick on stomach  . Meperidine Nausea Only  . Oxycodone-Acetaminophen Nausea Only     Current Outpatient Medications:  .  Calcium Carbonate-Vitamin D (CALCIUM 600+D HIGH POTENCY PO), Take 1 tablet 2 (two) times daily by mouth., Disp: , Rfl:  .  cetirizine (ZYRTEC) 10 MG tablet, Take 1 tablet (10 mg total) by mouth daily., Disp: 30 tablet, Rfl: 11 .  Cholecalciferol (VITAMIN D-3) 5000 units TABS, Take 1 tablet by mouth daily., Disp: , Rfl:  .  Cyanocobalamin (VITAMIN B-12) 5000 MCG SUBL, Place 1 tablet daily under the tongue. , Disp: , Rfl:  .  docusate sodium (COLACE) 100 MG capsule, One capsule every morning and one capsule every evening, Disp: 60 capsule, Rfl: 12 .  ezetimibe  (ZETIA) 10 MG tablet, Take 1 tablet (10 mg total) by mouth daily., Disp: 30 tablet, Rfl: 5 .  loperamide (IMODIUM A-D) 2 MG tablet, One tablet by mouth as needed for diarhea up to 3 tablets in 24 hours, Disp: 30 tablet, Rfl: 12 .  metoprolol (LOPRESSOR) 50 MG tablet, TAKE 2 TABLETS BY MOUTH ONCE EVERY MORNING AND TAKE 1 TABLET BY MOUTHEVERYEVENING, Disp: 90 tablet, Rfl: 3 .  Milk Thistle 175 MG CAPS, Take by mouth., Disp: , Rfl:  .  mirabegron ER (MYRBETRIQ) 25 MG TB24 tablet, Take 1 tablet (25 mg total) by mouth daily., Disp: 30 tablet, Rfl: 11 .  polyethylene glycol powder (GLYCOLAX/MIRALAX) powder, Take 17 g 2 (two) times daily as needed by mouth., Disp: 3350 g, Rfl: 5 .  PREMARIN vaginal cream, APPLY 1 APPLICATOR VAGINALLY 3 NIGHTS A WEEK, Disp: 30 g, Rfl: PRN .  Rivaroxaban (XARELTO) 15 MG TABS tablet, Take 1 tablet (15 mg total) by mouth daily with supper., Disp: 30 tablet, Rfl: 6  Review of Systems  Constitutional: Negative for appetite change, chills, fatigue, fever and weight loss.  HENT: Positive for congestion. Negative for ear pain, postnasal drip, rhinorrhea and sore throat.   Eyes: Positive for discharge and redness.  Respiratory: Positive for cough. Negative for hemoptysis, chest tightness, shortness of breath and wheezing.   Cardiovascular: Negative for chest pain and palpitations.  Gastrointestinal: Negative for abdominal pain, heartburn, nausea and vomiting.  Musculoskeletal: Negative for myalgias.  Skin: Negative for rash.  Neurological: Negative for dizziness, weakness and headaches.    Social History   Tobacco Use  . Smoking status: Never Smoker  . Smokeless tobacco: Never Used  . Tobacco comment: tobacco use- no   Substance Use Topics  . Alcohol use: No   Objective:   BP 124/74 (BP Location: Left Arm, Patient Position: Sitting, Cuff Size: Large)   Pulse 91   Temp 98.4 F (36.9 C) (Oral)   Resp 16   Ht 5\' 7"  (1.702 m)   Wt 144 lb (65.3 kg)   SpO2 99%   BMI  22.55 kg/m  Vitals:   12/25/17 1417  BP: 124/74  Pulse: 91  Resp: 16  Temp: 98.4 F (36.9 C)  TempSrc: Oral  SpO2: 99%  Weight: 144 lb (65.3 kg)  Height: 5\' 7"  (1.702 m)   Physical Exam  General Appearance:    Alert, cooperative, no distress  HENT:   neck without nodes, throat normal without erythema or exudate, no sinus tenderness and nasal mucosa pale and congested  Eyes:    PERRL, conjunctiva/corneas clear, EOM's intact       Lungs:     Occasional expiratory wheeze, no rales, , respirations unlabored  Heart:    Regular rate and rhythm  Neurologic:   Awake, alert, oriented x 3. No apparent focal neurological           defect.           Assessment & Plan:     1. Bronchitis  - azithromycin (ZITHROMAX) 250 MG tablet; 2 by mouth today, then 1 daily for 4 days  Dispense: 6 tablet; Refill: 0  2. Allergic rhinitis due to pollen, unspecified seasonality  - cetirizine (ZYRTEC) 10 MG tablet; Take 1 tablet (10 mg total) by mouth daily as needed for allergies.  Dispense: 30 tablet; Refill: Jardine, MD  [facility]

## 2018-01-09 ENCOUNTER — Encounter: Payer: Self-pay | Admitting: Family Medicine

## 2018-01-09 ENCOUNTER — Ambulatory Visit (INDEPENDENT_AMBULATORY_CARE_PROVIDER_SITE_OTHER): Payer: Medicare Other

## 2018-01-09 ENCOUNTER — Ambulatory Visit (INDEPENDENT_AMBULATORY_CARE_PROVIDER_SITE_OTHER): Payer: Medicare Other | Admitting: Family Medicine

## 2018-01-09 VITALS — BP 140/82 | HR 80 | Temp 97.9°F | Ht 61.0 in | Wt 149.0 lb

## 2018-01-09 DIAGNOSIS — I5032 Chronic diastolic (congestive) heart failure: Secondary | ICD-10-CM

## 2018-01-09 DIAGNOSIS — J301 Allergic rhinitis due to pollen: Secondary | ICD-10-CM | POA: Diagnosis not present

## 2018-01-09 DIAGNOSIS — Z0000 Encounter for general adult medical examination without abnormal findings: Secondary | ICD-10-CM | POA: Diagnosis not present

## 2018-01-09 DIAGNOSIS — I10 Essential (primary) hypertension: Secondary | ICD-10-CM

## 2018-01-09 MED ORDER — CETIRIZINE HCL 10 MG PO TABS: 10.0000 mg | ORAL_TABLET | Freq: Every day | ORAL | 11 refills | Status: DC | PRN

## 2018-01-09 NOTE — Progress Notes (Signed)
Patient: Whitney Hoffman Female    DOB: 17-Mar-1924   84 y.o.   MRN: [id number] Visit Date: 01/09/2018  Today's Provider: Lelon Huh, MD   Chief Complaint  Patient presents with  . Annual Exam  . Hypertension  . Hyperlipidemia   Subjective:   Patient saw [facility] for AWV today at 1:00 pm.  HPI   Hypertension, follow-up:  BP Readings from Last 3 Encounters:  01/09/18 140/82  12/25/17 124/74  12/05/17 122/76    She was last seen for hypertension 10 months ago.  BP at that visit was 110/88. Management since that visit includes; no changes.She reports good compliance with treatment. She is not having side effects. none She some exercising. She is adherent to low salt diet.   Outside blood pressures are Bp checked regularly at assisted living . She is experiencing none.  Patient denies none.   Cardiovascular risk factors include advanced age (older than 52 for men, 17 for women).  Use of agents associated with hypertension: none.   ------------------------------------------------------------    Lipid/Cholesterol, Follow-up:   Last seen for this 10 months ago.  Management since that visit includes; labs checked, no changes.  Last Lipid Panel:    Component Value Date/Time   CHOL 151 03/06/2017 0922   TRIG 105 03/06/2017 0922   HDL 51 03/06/2017 0922   CHOLHDL 3.0 03/06/2017 0922   LDLCALC 79 03/06/2017 0922    She reports good compliance with treatment. She is not having side effects. none  Wt Readings from Last 3 Encounters:  01/09/18 149 lb (67.6 kg)  12/25/17 144 lb (65.3 kg)  12/05/17 148 lb (67.1 kg)    ------------------------------------------------------------  Chronic diastolic heart failure ([place]) From 11/30/2016-no changes. Stable, continue routine follow up Dr. Caryl Comes. Was last seen on 12-05-2017  Parkinson's disease ([place]) From 11/30/2016-no changes. She no longer sees Dr. Manuella Ghazi since she did not tolerate any medications that he was  prescribing.   Osteoporosis, unspecified osteoporosis type, unspecified pathological fracture presence From 11/30/2016-Bone Density ordered. Showed improvement, to repeat in 2 years.   She was seen earlier this month with bronchitis and treated with Zpack. States cough has improved, but not resolved. Is having a lot of sinus congestion and drainage and feels it may be contributing to cough.   Allergies  Allergen Reactions  . Codeine     Feel dizzy  . Meperidine Hcl     Sick on stomach   . Morphine     Nausea/sick on stomach  . Propoxyphene   . Aspirin Nausea Only    Sick on stomach  . Diazepam Nausea Only    Sick on stomach  . Meperidine Nausea Only  . Oxycodone-Acetaminophen Nausea Only     Current Outpatient Medications:  .  acetaminophen (TYLENOL) 500 MG tablet, Take 500 mg by mouth every 6 (six) hours as needed., Disp: , Rfl:  .  Calcium Carbonate-Vitamin D (CALCIUM 600+D HIGH POTENCY PO), Take 1 tablet 2 (two) times daily by mouth., Disp: , Rfl:  .  cetirizine (ZYRTEC) 10 MG tablet, Take 1 tablet (10 mg total) by mouth daily as needed for allergies., Disp: 30 tablet, Rfl: 5 .  Cholecalciferol (VITAMIN D-3) 5000 units TABS, Take 1 tablet by mouth daily., Disp: , Rfl:  .  Cyanocobalamin (VITAMIN B-12) 5000 MCG SUBL, Place 1 tablet daily under the tongue. , Disp: , Rfl:  .  docusate sodium (COLACE) 100 MG capsule, One capsule every morning and one capsule  every evening, Disp: 60 capsule, Rfl: 12 .  ezetimibe (ZETIA) 10 MG tablet, Take 1 tablet (10 mg total) by mouth daily., Disp: 30 tablet, Rfl: 5 .  loperamide (IMODIUM A-D) 2 MG tablet, One tablet by mouth as needed for diarhea up to 3 tablets in 24 hours, Disp: 30 tablet, Rfl: 12 .  metoprolol (LOPRESSOR) 50 MG tablet, TAKE 2 TABLETS BY MOUTH ONCE EVERY MORNING AND TAKE 1 TABLET BY MOUTHEVERYEVENING (Patient taking differently: TAKE 1 TABLET BY MOUTH ONCE EVERY EVENING), Disp: 90 tablet, Rfl: 3 .  Milk Thistle 175 MG CAPS,  Take by mouth daily. , Disp: , Rfl:  .  mirabegron ER (MYRBETRIQ) 25 MG TB24 tablet, Take 1 tablet (25 mg total) by mouth daily., Disp: 30 tablet, Rfl: 11 .  polyethylene glycol powder (GLYCOLAX/MIRALAX) powder, Take 17 g 2 (two) times daily as needed by mouth., Disp: 3350 g, Rfl: 5 .  PREMARIN vaginal cream, APPLY 1 APPLICATOR VAGINALLY 3 NIGHTS A WEEK, Disp: 30 g, Rfl: PRN .  Rivaroxaban (XARELTO) 15 MG TABS tablet, Take 1 tablet (15 mg total) by mouth daily with supper., Disp: 30 tablet, Rfl: 6  Review of Systems  Constitutional: Positive for activity change.  HENT: Positive for hearing loss, mouth sores, postnasal drip, rhinorrhea, tinnitus, trouble swallowing and voice change.   Eyes: Positive for photophobia, pain, discharge, redness, itching and visual disturbance.  Respiratory: Negative.   Cardiovascular: Positive for leg swelling.  Gastrointestinal: Positive for constipation and diarrhea.  Endocrine: Positive for cold intolerance and polyuria.  Genitourinary: Positive for enuresis.  Musculoskeletal: Positive for arthralgias, back pain and gait problem.  Skin: Negative.   Allergic/Immunologic: Negative.   Neurological: Positive for dizziness, tremors, weakness and light-headedness.  Hematological: Negative.   Psychiatric/Behavioral: Negative.     Social History   Tobacco Use  . Smoking status: Never Smoker  . Smokeless tobacco: Never Used  . Tobacco comment: tobacco use- no   Substance Use Topics  . Alcohol use: No   Objective:  Vitals:  BP 140/82 (BP Location: Right Arm)   Pulse 80   Temp 97.9 F (36.6 C) (Oral)   Ht 5\' 1"  (1.549 m)   Wt 149 lb (67.6 kg)   BMI 28.15 kg/m   BSA 1.71 m    Physical Exam   General Appearance:    Alert, cooperative, no distress, wears hearling aids  Eyes:    PERRL, conjunctiva/corneas clear, EOM's intact       Lungs:     Clear to auscultation bilaterally, respirations unlabored  Heart:    Regular rate and rhythm. 2+ bilateral  ankle edema.   Neurologic:   Awake, alert, oriented x 3. Persistent resting tremor of UEs. Right greater than left.           Assessment & Plan:     1. Essential hypertension Well controlled. Continue current medications.    2. Allergic rhinitis due to pollen, unspecified seasonality Start back on - cetirizine (ZYRTEC) 10 MG tablet; Take 1 tablet (10 mg total) by mouth daily as needed for allergies.  Dispense: 30 tablet; Refill: 11 Call if cough does not resolve after starting cetirizine.   3. Chronic diastolic heart failure (HCC) Stable. Continue regular follow up Dr. Caryl Comes. Continue current medications.        Lelon Huh, MD  [facility]

## 2018-01-09 NOTE — Patient Instructions (Signed)
   The CDC recommends two doses of Shingrix (the shingles vaccine) separated by 2 to 6 months for adults age 82 years and older. I recommend checking with your insurance plan regarding coverage for this vaccine.   

## 2018-01-09 NOTE — Progress Notes (Addendum)
Subjective:   Whitney ERKKILA is a 84 y.o. female who presents for Medicare Annual (Subsequent) preventive examination.  Review of Systems:  N/A  Cardiac Risk Factors include: advanced age (>79men, >78 women);dyslipidemia;hypertension      Objective:     Vitals: BP 140/82 (BP Location: Right Arm)   Pulse 80   Temp 97.9 F (36.6 C) (Oral)   Ht 5\' 1"  (1.549 m)   Wt 149 lb (67.6 kg)   BMI 28.15 kg/m   Body mass index is 28.15 kg/m.  Advanced Directives 01/09/2018 06/25/2017 05/19/2017 01/11/2017 01/02/2017 12/21/2016 01/22/2016  Does Patient Have a Medical Advance Directive? Yes Yes Yes Yes Yes Yes Yes  Type of Paramedic of [place];Living will Living will;Healthcare Power of [place];Living will Healthcare Power of [place];Living will [place];Living will  Does patient want to make changes to medical advance directive? - - No - Patient declined - - - -  Copy of Onset in Chart? Yes Yes Yes No - copy requested No - copy requested No - copy requested No - copy requested    Tobacco Social History   Tobacco Use  Smoking Status Never Smoker  Smokeless Tobacco Never Used  Tobacco Comment   tobacco use- no      Counseling given: Not Answered Comment: tobacco use- no    Clinical Intake:  Pre-visit preparation completed: Yes  Pain : No/denies pain Pain Score: 0-No pain     Nutritional Status: BMI 25 -29 Overweight Nutritional Risks: Nausea/ vomitting/ diarrhea(diarrhea intermittenly due to Parkinsons. ) Diabetes: No  How often do you need to have someone help you when you read instructions, pamphlets, or other written materials from your doctor or pharmacy?: 3 - Sometimes  Interpreter Needed?: No  Information entered by :: [facility], LPN  Past Medical History:  Diagnosis Date  . Atrial fibrillation ([place])   .  Hyperlipidemia   . Hypertension   . Parkinson disease ([place])   . Tachy-brady syndrome ([place])    with medtronic dual chambe rPCM   Past Surgical History:  Procedure Laterality Date  . ABDOMINAL HYSTERECTOMY  1966   fibroids ovaries removed secondary to ovarian cyst  . APPENDECTOMY  1944  . baldder tack     with her hysterectomy  . BREAST BIOPSY  2010   left benign repeat ultrasound recommneded in 2011 [facility]    . CHOLECYSTECTOMY  1969  . GALLBLADDER SURGERY    . HIP SURGERY Left    ORIF x 2; post op infection Left hip  . PACEMAKER PLACEMENT  06/29/2010  . WRIST SURGERY  1996   Traumatic B   Family History  Problem Relation Age of Onset  . Diabetes Mother   . Coronary artery disease Father   . Heart attack Father   . Alzheimer's disease Father   . Kidney cancer Neg Hx   . Bladder Cancer Neg Hx    Social History   Socioeconomic History  . Marital status: Widowed    Spouse name: Not on file  . Number of children: 4  . Years of education: Not on file  . Highest education level: 11th grade  Occupational History  . 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 Retired at age 65  Social  Needs  . Financial resource strain: Not hard at all  . Food insecurity:    Worry: Never true    Inability: Never true  . Transportation needs:    Medical: No    Non-medical: No  Tobacco Use  . Smoking status: Never Smoker  . Smokeless tobacco: Never Used  . Tobacco comment: tobacco use- no   Substance and Sexual Activity  . Alcohol use: No  . Drug use: No  . Sexual activity: Not on file  Lifestyle  . Physical activity:    Days per week: Not on file    Minutes per session: Not on file  . Stress: Not at all  Relationships  . Social connections:    Talks on phone: Not on file    Gets together: Not on file    Attends religious service: Not on file    Active member of  club or organization: Not on file    Attends meetings of clubs or organizations: Not on file    Relationship status: Not on file  Other Topics Concern  . Not on file  Social History Narrative   Lives in [place]. Widowed. Retired. Lives alone     Outpatient Encounter Medications as of 01/09/2018  Medication Sig  . acetaminophen (TYLENOL) 500 MG tablet Take 500 mg by mouth every 6 (six) hours as needed.  . Calcium Carbonate-Vitamin D (CALCIUM 600+D HIGH POTENCY PO) Take 1 tablet 2 (two) times daily by mouth.  . cetirizine (ZYRTEC) 10 MG tablet Take 1 tablet (10 mg total) by mouth daily as needed for allergies.  . Cholecalciferol (VITAMIN D-3) 5000 units TABS Take 1 tablet by mouth daily.  . Cyanocobalamin (VITAMIN B-12) 5000 MCG SUBL Place 1 tablet daily under the tongue.   . docusate sodium (COLACE) 100 MG capsule One capsule every morning and one capsule every evening  . ezetimibe (ZETIA) 10 MG tablet Take 1 tablet (10 mg total) by mouth daily.  Marland Kitchen loperamide (IMODIUM A-D) 2 MG tablet One tablet by mouth as needed for diarhea up to 3 tablets in 24 hours  . metoprolol (LOPRESSOR) 50 MG tablet TAKE 2 TABLETS BY MOUTH ONCE EVERY MORNING AND TAKE 1 TABLET BY MOUTHEVERYEVENING (Patient taking differently: TAKE 1 TABLET BY MOUTH ONCE EVERY EVENING)  . Milk Thistle 175 MG CAPS Take by mouth daily.   . mirabegron ER (MYRBETRIQ) 25 MG TB24 tablet Take 1 tablet (25 mg total) by mouth daily.  . polyethylene glycol powder (GLYCOLAX/MIRALAX) powder Take 17 g 2 (two) times daily as needed by mouth.  Marland Kitchen PREMARIN vaginal cream APPLY 1 APPLICATOR VAGINALLY 3 NIGHTS A WEEK  . Rivaroxaban (XARELTO) 15 MG TABS tablet Take 1 tablet (15 mg total) by mouth daily with supper.   No facility-administered encounter medications on file as of 01/09/2018.     Activities of Daily Living In your present state of health, do you have any difficulty performing the following activities: 01/09/2018  Hearing? Y  Vision? Y    Comment Due to macular degeneration.  Difficulty concentrating or making decisions? Y  Walking or climbing stairs? Y  Comment Avoids steps, uses assistance devices to walk.  Dressing or bathing? Y  Comment Lives at a assisted lives facility.   Doing errands, shopping? Y  Comment Does not drive.  Preparing Food and eating ? Y  Comment Lives at a assisted lives facility.   Using the Toilet? N  In the past six months, have you accidently leaked urine? Y  Comment  Wears depends at all times.   Do you have problems with loss of bowel control? Y  Comment Rare  Managing your Medications? Y  Comment Lives at a assisted lives facility.   Managing your Finances? Y  Comment Daughter manages this.  Housekeeping or managing your Housekeeping? Y  Comment Lives at a assisted lives facility.   Some recent data might be hidden    Patient Care Team: Birdie Sons, MD as PCP - General (Family Medicine) Deboraha Sprang, MD as Consulting Physician (Cardiology)    Assessment:   This is a routine wellness examination for Chrishana.  Exercise Activities and Dietary recommendations Current Exercise Habits: The patient does not participate in regular exercise at present, Exercise limited by: orthopedic condition(s);Other - see comments(Parkinsons Disease.)  Goals    . DIET - INCREASE WATER INTAKE     Recommend to increase water intake to 5-6 glasses a day.       Fall Risk Fall Risk  01/09/2018 12/21/2016 11/30/2016 11/24/2015 06/15/2015  Falls in the past year? No No No No Yes  Number falls in past yr: - - - - 1  Injury with Fall? - - - - Yes  Risk for fall due to : - Impaired balance/gait - - -  Follow up - - - - Falls evaluation completed   Is the patient's home free of loose throw rugs in walkways, pet beds, electrical cords, etc?   yes      Grab bars in the bathroom? yes      Handrails on the stairs?   yes      Adequate lighting?   yes  Timed Get Up and Go performed: N/A  Depression  Screen PHQ 2/9 Scores 01/09/2018 12/21/2016 11/30/2016 11/24/2015  PHQ - 2 Score 5 0 2 0  PHQ- 9 Score 14 - 13 -     Cognitive Function:      6CIT Screen 01/09/2018  What Year? 0 points  What month? 3 points  What time? 0 points  Count back from 20 0 points  Months in reverse 0 points  Repeat phrase 4 points  Total Score 7    Immunization History  Administered Date(s) Administered  . Pneumococcal Conjugate-13 05/13/2014  . Pneumococcal Polysaccharide-23 11/24/2015  . Tdap 09/30/2010  . Zoster 05/22/2012    Qualifies for Shingles Vaccine? Due for Shingles vaccine. Declined my offer to administer today. Education has been provided regarding the importance of this vaccine. Pt has been advised to call her insurance company to determine her out of pocket expense. Advised she may also receive this vaccine at her local pharmacy or Health Dept. Verbalized acceptance and understanding.  Screening Tests Health Maintenance  Topic Date Due  . INFLUENZA VACCINE  03/22/2018  . TETANUS/TDAP  09/30/2020  . DEXA SCAN  Completed  . PNA vac Low Risk Adult  Completed    Cancer Screenings: Lung: Low Dose CT Chest recommended if Age 52-80 years, 30 pack-year currently smoking OR have quit w/in 15years. Patient does not qualify. Breast:  Up to date on Mammogram? Yes   Up to date of Bone Density/Dexa? Yes Colorectal: Up to date  Additional Screenings:  Hepatitis C Screening: N/A     Plan:  I have personally reviewed and addressed the Medicare Annual Wellness questionnaire and have noted the following in the patient's chart:  A. Medical and social history B. Use of alcohol, tobacco or illicit drugs  C. Current medications and supplements D. Functional ability and status  E.  Nutritional status F.  Physical activity G. Advance directives H. List of other physicians I.  Hospitalizations, surgeries, and ER visits in previous 12 months J.  [place] such as hearing and vision if  needed, cognitive and depression L. Referrals and appointments - none  In addition, I have reviewed and discussed with patient certain preventive protocols, quality metrics, and best practice recommendations. A written personalized care plan for preventive services as well as general preventive health recommendations were provided to patient.  See attached scanned questionnaire for additional information.   Signed,  Fabio Neighbors, LPN Nurse Health Advisor   Nurse Recommendations: None.

## 2018-01-09 NOTE — Patient Instructions (Addendum)
Whitney Hoffman , Thank you for taking time to come for your Medicare Wellness Visit. I appreciate your ongoing commitment to your health goals. Please review the following plan we discussed and let me know if I can assist you in the future.   Screening recommendations/referrals: Colonoscopy: Up to date Mammogram: Up to date Bone Density: Up to date Recommended yearly ophthalmology/optometry visit for glaucoma screening and checkup Recommended yearly dental visit for hygiene and checkup  Vaccinations: Influenza vaccine: N/A Pneumococcal vaccine: Up to date Tdap vaccine: Up to date Shingles vaccine: Pt declines today.     Advanced directives: Already on file.   Conditions/risks identified: Recommend to increase water intake to 5-6 glasses a day.  Next appointment: 2:00 PM today with Dr Caryn Section.    Preventive Care 33 Years and Older, Female Preventive care refers to lifestyle choices and visits with your health care provider that can promote health and wellness. What does preventive care include?  A yearly physical exam. This is also called an annual well check.  Dental exams once or twice a year.  Routine eye exams. Ask your health care provider how often you should have your eyes checked.  Personal lifestyle choices, including:  Daily care of your teeth and gums.  Regular physical activity.  Eating a healthy diet.  Avoiding tobacco and drug use.  Limiting alcohol use.  Practicing safe sex.  Taking low-dose aspirin every day.  Taking vitamin and mineral supplements as recommended by your health care provider. What happens during an annual well check? The services and screenings done by your health care provider during your annual well check will depend on your age, overall health, lifestyle risk factors, and family history of disease. Counseling  Your health care provider may ask you questions about your:  Alcohol use.  Tobacco use.  Drug use.  Emotional  well-being.  Home and relationship well-being.  Sexual activity.  Eating habits.  History of falls.  Memory and ability to understand (cognition).  Work and work Statistician.  Reproductive health. Screening  You may have the following tests or measurements:  Height, weight, and BMI.  Blood pressure.  Lipid and cholesterol levels. These may be checked every 5 years, or more frequently if you are over 61 years old.  Skin check.  Lung cancer screening. You may have this screening every year starting at age 64 if you have a 30-pack-year history of smoking and currently smoke or have quit within the past 15 years.  Fecal occult blood test (FOBT) of the stool. You may have this test every year starting at age 21.  Flexible sigmoidoscopy or colonoscopy. You may have a sigmoidoscopy every 5 years or a colonoscopy every 10 years starting at age 65.  Hepatitis C blood test.  Hepatitis B blood test.  Sexually transmitted disease (STD) testing.  Diabetes screening. This is done by checking your blood sugar (glucose) after you have not eaten for a while (fasting). You may have this done every 1-3 years.  Bone density scan. This is done to screen for osteoporosis. You may have this done starting at age 72.  Mammogram. This may be done every 1-2 years. Talk to your health care provider about how often you should have regular mammograms. Talk with your health care provider about your test results, treatment options, and if necessary, the need for more tests. Vaccines  Your health care provider may recommend certain vaccines, such as:  Influenza vaccine. This is recommended every year.  Tetanus, diphtheria, and  acellular pertussis (Tdap, Td) vaccine. You may need a Td booster every 10 years.  Zoster vaccine. You may need this after age 39.  Pneumococcal 13-valent conjugate (PCV13) vaccine. One dose is recommended after age 19.  Pneumococcal polysaccharide (PPSV23) vaccine. One  dose is recommended after age 61. Talk to your health care provider about which screenings and vaccines you need and how often you need them. This information is not intended to replace advice given to you by your health care provider. Make sure you discuss any questions you have with your health care provider. Document Released: 09/04/2015 Document Revised: 04/27/2016 Document Reviewed: 06/09/2015 Elsevier Interactive Patient Education  2017 [employer] Prevention in the Home Falls can cause injuries. They can happen to people of all ages. There are many things you can do to make your home safe and to help prevent falls. What can I do on the outside of my home?  Regularly fix the edges of walkways and driveways and fix any cracks.  Remove anything that might make you trip as you walk through a door, such as a raised step or threshold.  Trim any bushes or trees on the path to your home.  Use bright outdoor lighting.  Clear any walking paths of anything that might make someone trip, such as rocks or tools.  Regularly check to see if handrails are loose or broken. Make sure that both sides of any steps have handrails.  Any raised decks and porches should have guardrails on the edges.  Have any leaves, snow, or ice cleared regularly.  Use sand or salt on walking paths during winter.  Clean up any spills in your garage right away. This includes oil or grease spills. What can I do in the bathroom?  Use night lights.  Install grab bars by the toilet and in the tub and shower. Do not use towel bars as grab bars.  Use non-skid mats or decals in the tub or shower.  If you need to sit down in the shower, use a plastic, non-slip stool.  Keep the floor dry. Clean up any water that spills on the floor as soon as it happens.  Remove soap buildup in the tub or shower regularly.  Attach bath mats securely with double-sided non-slip rug tape.  Do not have throw rugs and other  things on the floor that can make you trip. What can I do in the bedroom?  Use night lights.  Make sure that you have a light by your bed that is easy to reach.  Do not use any sheets or blankets that are too big for your bed. They should not hang down onto the floor.  Have a firm chair that has side arms. You can use this for support while you get dressed.  Do not have throw rugs and other things on the floor that can make you trip. What can I do in the kitchen?  Clean up any spills right away.  Avoid walking on wet floors.  Keep items that you use a lot in easy-to-reach places.  If you need to reach something above you, use a strong step stool that has a grab bar.  Keep electrical cords out of the way.  Do not use floor polish or wax that makes floors slippery. If you must use wax, use non-skid floor wax.  Do not have throw rugs and other things on the floor that can make you trip. What can I do with my stairs?  Do not leave any items on the stairs.  Make sure that there are handrails on both sides of the stairs and use them. Fix handrails that are broken or loose. Make sure that handrails are as long as the stairways.  Check any carpeting to make sure that it is firmly attached to the stairs. Fix any carpet that is loose or worn.  Avoid having throw rugs at the top or bottom of the stairs. If you do have throw rugs, attach them to the floor with carpet tape.  Make sure that you have a light switch at the top of the stairs and the bottom of the stairs. If you do not have them, ask someone to add them for you. What else can I do to help prevent falls?  Wear shoes that:  Do not have high heels.  Have rubber bottoms.  Are comfortable and fit you well.  Are closed at the toe. Do not wear sandals.  If you use a stepladder:  Make sure that it is fully opened. Do not climb a closed stepladder.  Make sure that both sides of the stepladder are locked into place.  Ask  someone to hold it for you, if possible.  Clearly mark and make sure that you can see:  Any grab bars or handrails.  First and last steps.  Where the edge of each step is.  Use tools that help you move around (mobility aids) if they are needed. These include:  Canes.  Walkers.  Scooters.  Crutches.  Turn on the lights when you go into a dark area. Replace any light bulbs as soon as they burn out.  Set up your furniture so you have a clear path. Avoid moving your furniture around.  If any of your floors are uneven, fix them.  If there are any pets around you, be aware of where they are.  Review your medicines with your doctor. Some medicines can make you feel dizzy. This can increase your chance of falling. Ask your doctor what other things that you can do to help prevent falls. This information is not intended to replace advice given to you by your health care provider. Make sure you discuss any questions you have with your health care provider. Document Released: 06/04/2009 Document Revised: 01/14/2016 Document Reviewed: 09/12/2014 Elsevier Interactive Patient Education  2017 [employer].

## 2018-01-16 ENCOUNTER — Ambulatory Visit: Payer: Medicare Other | Admitting: Urology

## 2018-02-05 ENCOUNTER — Ambulatory Visit: Payer: Medicare Other | Admitting: Urology

## 2018-02-19 ENCOUNTER — Other Ambulatory Visit: Payer: Self-pay | Admitting: Family Medicine

## 2018-02-19 NOTE — Telephone Encounter (Signed)
Needs refill on Zyrtec.  She is in an assisted living and has to have it as a rx  She uses [facility]  They deliver to [facility]

## 2018-02-19 NOTE — Telephone Encounter (Signed)
Called [facility] who confirmed there are refills available at the pharmacy for Zyrtec. Pharmacist Caryl Pina stated she will contact [facility] to see if they need prescription sent to them.

## 2018-02-21 ENCOUNTER — Ambulatory Visit (INDEPENDENT_AMBULATORY_CARE_PROVIDER_SITE_OTHER): Payer: Medicare Other | Admitting: *Deleted

## 2018-02-21 DIAGNOSIS — I495 Sick sinus syndrome: Secondary | ICD-10-CM

## 2018-02-21 NOTE — Progress Notes (Signed)
Remote pacemaker transmission.   

## 2018-03-15 ENCOUNTER — Ambulatory Visit (INDEPENDENT_AMBULATORY_CARE_PROVIDER_SITE_OTHER): Payer: Medicare Other | Admitting: Family Medicine

## 2018-03-15 ENCOUNTER — Encounter: Payer: Self-pay | Admitting: Family Medicine

## 2018-03-15 VITALS — BP 132/86 | HR 72 | Temp 97.8°F | Resp 16

## 2018-03-15 DIAGNOSIS — I878 Other specified disorders of veins: Secondary | ICD-10-CM | POA: Diagnosis not present

## 2018-03-15 DIAGNOSIS — F329 Major depressive disorder, single episode, unspecified: Secondary | ICD-10-CM | POA: Insufficient documentation

## 2018-03-15 DIAGNOSIS — G20 Parkinson's disease: Secondary | ICD-10-CM | POA: Diagnosis not present

## 2018-03-15 DIAGNOSIS — F32A Depression, unspecified: Secondary | ICD-10-CM | POA: Insufficient documentation

## 2018-03-15 DIAGNOSIS — H539 Unspecified visual disturbance: Secondary | ICD-10-CM

## 2018-03-15 LAB — CUP PACEART REMOTE DEVICE CHECK
Battery Impedance: 1199 Ohm
Battery Remaining Longevity: 57 mo
Battery Voltage: 2.77 V
Brady Statistic RV Percent Paced: 20 %
Implantable Lead Implant Date: 20111108
Implantable Lead Location: 753860
Implantable Lead Model: 5076
Lead Channel Impedance Value: 557 Ohm
Lead Channel Impedance Value: 67 Ohm
Lead Channel Setting Pacing Pulse Width: 0.4 ms
MDC IDC LEAD IMPLANT DT: 20111108
MDC IDC LEAD LOCATION: 753859
MDC IDC MSMT LEADCHNL RV PACING THRESHOLD AMPLITUDE: 0.875 V
MDC IDC MSMT LEADCHNL RV PACING THRESHOLD PULSEWIDTH: 0.4 ms
MDC IDC MSMT LEADCHNL RV SENSING INTR AMPL: 11.2 mV
MDC IDC PG IMPLANT DT: 20111108
MDC IDC SESS DTM: 20190703004333
MDC IDC SET LEADCHNL RV PACING AMPLITUDE: 2.5 V
MDC IDC SET LEADCHNL RV SENSING SENSITIVITY: 4 mV

## 2018-03-15 MED ORDER — MIRTAZAPINE 7.5 MG PO TABS: 7.5000 mg | ORAL_TABLET | Freq: Every day | ORAL | 2 refills | Status: DC

## 2018-03-15 NOTE — Patient Instructions (Signed)
Parkinson Disease Parkinson disease is a long-term (chronic) condition that gets worse over time (is progressive). Parkinson disease limits your ability to control your movements and move your body normally. This condition is a type of movement disorder. Each person with Parkinson disease is affected differently. The condition can range from mild to severe. Parkinson disease tends to progress slowly over several years. What are the causes? Parkinson disease results from a loss of brain cells (neurons) in a specific part of the brain (substantia nigra). Some of the neurons in the substantia nigra make an important brain chemical (dopamine). Dopamine is needed to control movement. As the condition gets worse, neurons make less dopamine. This makes it hard to move or control your movements. The exact cause of why neurons are lost or produce less dopamine is not known. Genetic and environmental factors may contribute to the cause of Parkinson disease. What increases the risk? This condition is more likely to develop in:  Men.  People who are 76 years of age or older.  People who have a family history of Parkinson disease.  What are the signs or symptoms? Symptoms of this condition can vary from person to person. The main (primary) symptoms are related to movement (motor symptoms). These include:  Uncontrolled shaking movements (tremor). Tremors usually start in a hand or foot when you are resting (resting tremor). The tremor may stop when you move around.  Slowing of movement. You may lose facial expression and have trouble making the small movements that are needed to button clothing or brush your teeth. You may walk with short, shuffling steps.  Stiff movement (rigidity). This mostly affects your arms, legs, neck, and upper body. You may walk without swinging your arms. Rigidity can be painful.  Loss of balance and stability when standing. You may sway, fall backward, and have trouble making  turns.  Secondary motor symptoms of this condition include:  Shrinking handwriting.  Stooped posture.  Slowed speech.  Trouble swallowing.  Drooling.  Sexual dysfunction.  Muscle cramps.  Loss of smell.  Additional symptoms that are not related to movement include:  Constipation.  Mood swings.  Depression or anxiety.  Sleep disturbances.  Confusion.  Loss of mental abilities (dementia).  Low blood pressure.  Trouble concentrating.  How is this diagnosed? Parkinson disease can be hard to diagnose in its early stages. A diagnosis may be made based on symptoms, a medical history, and physical exam. During your exam, your health care provider will look for:  Lack of facial expression.  Resting tremor.  Stiffness in your neck, arms, and legs.  Abnormal walk.  Trouble with balance.  You may have brain imaging tests done to check for a loss of dopamine-producing areas of the brain. Your healthcare provider may also grade the severity of your condition as mild, moderate, or advanced. Parkinson disease progression is different for everyone. You may not progress to the advanced stage. Mild Parkinson disease involves:  Movement problems that do not affect daily activities.  Movement problems on one side of the body.  Movement problems that are controlled with medicines.  Good response to exercise.  Moderate Parkinson disease involves:  Movement problems on both sides of the body.  Slowing of movement.  Coordination and balance problems.  Less of a response to medicine.  More side effects from medicines.  Advanced Parkinson disease involves:  Extreme difficulty walking.  Inability to live alone safely.  Signs of dementia.  Difficulty controlling symptoms with medicine.  How is  this treated? There is no cure for Parkinson disease. Treatment focuses on relieving your symptoms. Treatment may include:  Medicines.  Speech, occupational, and  physical therapy.  Surgery.  Everyone responds to medicines differently. Your response may change over time. Work with your health care provider to find the best medicines for you. These may include:  Dopamine replacement drugs. These are the most effective medicines. A long-term side effect of these medicines is uncontrolled movements (dyskinesias).  Dopamine agonists. These drugs act like dopamine to stimulate dopamine receptors in the brain. Side effects include nausea and sleepiness, but they cause less dyskinesia.  Other medicines to reduce tremor, prevent dopamine breakdown, reduce dyskinesia, and reduce dementia that is related to Parkinson disease.  Another treatment is deep brain stimulation surgery to reduce tremors and dyskinesia. This procedure involves placing electrodes in the brain. The electrodes are attached to an electric pulse generator that acts like a pacemaker for your brain. This may be an option if you have had the condition for at least four years and are not responding well to medicines. Follow these instructions at home:  Take over-the-counter and prescription medicines only as told by your health care provider.  Install grab bars and railings in your home to prevent falls.  Follow instructions from your health care provider about eating or drinking restrictions.  Return to your normal activities as told by your health care provider. Ask your health care provider what activities are safe for you.  Get regular exercise as told by your health care provider or a physical therapist.  Keep all follow-up visits as told by your health care provider. This is important. These include any visits with a speech therapist or occupational therapist.  Consider joining a support group for people with Parkinson disease. Contact a health care provider if:  Medicines do not help your symptoms.  You are unsteady or have fallen at home.  You need more support to function well  at home.  You have trouble swallowing.  You have severe constipation.  You are struggling with side effects from your medicines.  You see or hear things that are not real (hallucinate).  You feel confused, anxious, or depressed. Get help right away if:  You are injured after a fall.  You cannot swallow without choking.  You have chest pain or trouble breathing.  You do not feel safe at home. This information is not intended to replace advice given to you by your health care provider. Make sure you discuss any questions you have with your health care provider. Document Released: 08/05/2000 Document Revised: 01/11/2016 Document Reviewed: 05/29/2015 Elsevier Interactive Patient Education  [employer].

## 2018-03-15 NOTE — Progress Notes (Signed)
Patient: Whitney Hoffman Female    DOB: Oct 28, 1923   84 y.o.   MRN: [id number] Visit Date: 03/15/2018  Today's Provider: Lavon Paganini, MD   I, Martha Clan, CMA, am acting as scribe for Lavon Paganini, MD.  Chief Complaint  Patient presents with  . Hallucinations  . Skin Problem   Subjective:    HPI   Daughter states pt saw black hair growing out of her skin.  This started yesterday.  Pt was picking at the hair she saw. Pt states she still sees the hair. She does have macular degeneration, that is irreversible. They believe the hallucinations could be form visual disturbances.  She also admits to seeing flowers on someone's black suit that she knows were not actually there.  She is worried about this and concerned that she may be going crazy.  Patient also has history of Parkinson's disease and some degree of cognitive impairment.  She is not on any medications for her Parkinson's disease as she could not tolerate side effects of carbidopa levodopa.  She does not currently have a neurologist.  Patient's daughter reports that a psychiatrist was supposed to round on the patient at her assisted living facility, but this has not happened yet.  She is found that the patient is depressed and more withdrawn as her condition continues.  She has never taken medication for this.  Daughter has also noticed red rash on pt's BLE, which is gradually worsening. She is concerned that this is a circulatory problem.  Patient does sit in her chair frequently with her legs hanging down and does have swelling of bilateral ankles chronically.  Allergies  Allergen Reactions  . Codeine     Feel dizzy  . Meperidine Hcl     Sick on stomach   . Morphine     Nausea/sick on stomach  . Propoxyphene   . Aspirin Nausea Only    Sick on stomach  . Diazepam Nausea Only    Sick on stomach  . Meperidine Nausea Only  . Oxycodone-Acetaminophen Nausea Only     Current Outpatient Medications:  .   Calcium Carbonate-Vitamin D (CALCIUM 600+D HIGH POTENCY PO), Take 1 tablet 2 (two) times daily by mouth., Disp: , Rfl:  .  cetirizine (ZYRTEC) 10 MG tablet, Take 1 tablet (10 mg total) by mouth daily as needed for allergies., Disp: 30 tablet, Rfl: 11 .  Cholecalciferol (VITAMIN D-3) 5000 units TABS, Take 1 tablet by mouth daily., Disp: , Rfl:  .  Cyanocobalamin (VITAMIN B-12) 5000 MCG SUBL, Place 1 tablet daily under the tongue. , Disp: , Rfl:  .  docusate sodium (COLACE) 100 MG capsule, One capsule every morning and one capsule every evening, Disp: 60 capsule, Rfl: 12 .  ezetimibe (ZETIA) 10 MG tablet, Take 1 tablet (10 mg total) by mouth daily., Disp: 30 tablet, Rfl: 5 .  loperamide (IMODIUM A-D) 2 MG tablet, One tablet by mouth as needed for diarhea up to 3 tablets in 24 hours, Disp: 30 tablet, Rfl: 12 .  metoprolol (LOPRESSOR) 50 MG tablet, TAKE 2 TABLETS BY MOUTH ONCE EVERY MORNING AND TAKE 1 TABLET BY MOUTHEVERYEVENING (Patient taking differently: TAKE 1 TABLET BY MOUTH ONCE EVERY EVENING), Disp: 90 tablet, Rfl: 3 .  Milk Thistle 175 MG CAPS, Take by mouth daily. , Disp: , Rfl:  .  mirabegron ER (MYRBETRIQ) 25 MG TB24 tablet, Take 1 tablet (25 mg total) by mouth daily., Disp: 30 tablet, Rfl: 11 .  polyethylene glycol powder (GLYCOLAX/MIRALAX) powder, Take 17 g 2 (two) times daily as needed by mouth., Disp: 3350 g, Rfl: 5 .  PREMARIN vaginal cream, APPLY 1 APPLICATOR VAGINALLY 3 NIGHTS A WEEK, Disp: 30 g, Rfl: PRN .  Rivaroxaban (XARELTO) 15 MG TABS tablet, Take 1 tablet (15 mg total) by mouth daily with supper., Disp: 30 tablet, Rfl: 6 .  acetaminophen (TYLENOL) 500 MG tablet, Take 500 mg by mouth every 6 (six) hours as needed., Disp: , Rfl:   Review of Systems  Constitutional: Positive for activity change and fatigue. Negative for appetite change, chills, diaphoresis, fever and unexpected weight change.  Eyes: Positive for visual disturbance.  Respiratory: Positive for chest tightness.  Negative for shortness of breath.   Cardiovascular: Positive for leg swelling. Negative for chest pain and palpitations.  Neurological: Positive for tremors.  Psychiatric/Behavioral: Positive for hallucinations.    Social History   Tobacco Use  . Smoking status: Never Smoker  . Smokeless tobacco: Never Used  . Tobacco comment: tobacco use- no   Substance Use Topics  . Alcohol use: No   Objective:   BP 132/86 (BP Location: Left Arm, Patient Position: Sitting, Cuff Size: Normal)   Pulse 72   Temp 97.8 F (36.6 C) (Oral)   Resp 16  Vitals:   03/15/18 1622  BP: 132/86  Pulse: 72  Resp: 16  Temp: 97.8 F (36.6 C)  TempSrc: Oral     Physical Exam  Constitutional: She is oriented to person, place, and time. She appears well-developed and well-nourished. No distress.  Sitting in a wheelchair  HENT:  Head: Normocephalic and atraumatic.  Right Ear: External ear normal.  Left Ear: External ear normal.  Nose: Nose normal.  Mouth/Throat: Oropharynx is clear and moist.  Eyes: Pupils are equal, round, and reactive to light. Conjunctivae and EOM are normal. Right eye exhibits no discharge. Left eye exhibits no discharge. No scleral icterus.  Neck: Neck supple. No thyromegaly present.  Cardiovascular: Normal rate, regular rhythm and intact distal pulses.  Murmur heard. Pulmonary/Chest: Effort normal and breath sounds normal. No respiratory distress. She has no wheezes. She has no rales.  Abdominal: Soft. She exhibits no distension. There is no tenderness.  Musculoskeletal: She exhibits no edema.  Lymphadenopathy:    She has no cervical adenopathy.  Neurological: She is alert and oriented to person, place, and time. She displays tremor. She exhibits abnormal muscle tone.  Skin: Skin is warm and dry. Capillary refill takes less than 2 seconds. Rash noted.  Hyperpigmentation of bilateral ankles, no blanching  Psychiatric: She has a normal mood and affect. Her speech is normal and  behavior is normal. She expresses no homicidal and no suicidal ideation.  Vitals reviewed.      Assessment & Plan:   Problem List Items Addressed This Visit      Nervous and Auditory   Parkinson's disease ([place]) - Primary    Uncontrolled Has previously been intolerant to medications Patient's daughter requests a referral to [facility] neurology in [place] Discussed that it is possible with late stage Parkinson's to get some hallucinations or visual disturbances-see below      Relevant Orders   Ambulatory referral to Neurology     Other   Depression    New problem Related to chronic illness We will start Remeron patient at bedtime Follow-up in 1 month and consider dose titration if needed      Relevant Medications   mirtazapine (REMERON) 7.5 MG tablet   Visual disturbance  New problem Unclear if this is an hallucination or visual disturbance related to her macular degeneration Patient understands that what she is seeing is not actually there which is reassuring We discussed that this could be related to her cognitive impairment or depression or Parkinson's disease or macular degeneration as above We are starting Remeron for depression which is not an antipsychotic and will not help with disturbance if it is hallucinations unless it is related to depression She is going to be seeing neurology for her Parkinson's disease She also is followed regularly by ophthalmology She is not a danger to herself or others at this time We will follow-up in 1 month      Relevant Orders   Ambulatory referral to Neurology   Chronic venous stasis    Patient with chronic ankle edema that is positional in nature She does not tolerate compression stockings She agrees to elevate her legs more 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   The entirety of the information documented in the History of Present Illness, Review of Systems and Physical Exam were personally obtained by me. Portions of this information were initially documented by Raquel Sarna Ratchford, CMA and reviewed by me for thoroughness and accuracy.    Virginia Crews, MD, MPH [facility] 03/16/2018 10:52 AM

## 2018-03-16 DIAGNOSIS — I878 Other specified disorders of veins: Secondary | ICD-10-CM | POA: Insufficient documentation

## 2018-03-16 DIAGNOSIS — H539 Unspecified visual disturbance: Secondary | ICD-10-CM | POA: Insufficient documentation

## 2018-03-16 NOTE — Assessment & Plan Note (Signed)
New problem Related to chronic illness We will start Remeron patient at bedtime Follow-up in 1 month and consider dose titration if needed

## 2018-03-16 NOTE — Assessment & Plan Note (Signed)
New problem Unclear if this is an hallucination or visual disturbance related to her macular degeneration Patient understands that what she is seeing is not actually there which is reassuring We discussed that this could be related to her cognitive impairment or depression or Parkinson's disease or macular degeneration as above We are starting Remeron for depression which is not an antipsychotic and will not help with disturbance if it is hallucinations unless it is related to depression She is going to be seeing neurology for her Parkinson's disease She also is followed regularly by ophthalmology She is not a danger to herself or others at this time We will follow-up in 1 month

## 2018-03-16 NOTE — Assessment & Plan Note (Signed)
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

## 2018-03-16 NOTE — Assessment & Plan Note (Signed)
Uncontrolled Has previously been intolerant to medications Patient's daughter requests a referral to [facility] neurology in [place] Discussed that it is possible with late stage Parkinson's to get some hallucinations or visual disturbances-see below

## 2018-03-21 DIAGNOSIS — F4322 Adjustment disorder with anxiety: Secondary | ICD-10-CM | POA: Diagnosis not present

## 2018-03-21 DIAGNOSIS — R443 Hallucinations, unspecified: Secondary | ICD-10-CM | POA: Diagnosis not present

## 2018-03-21 DIAGNOSIS — G20 Parkinson's disease: Secondary | ICD-10-CM | POA: Diagnosis not present

## 2018-04-03 DIAGNOSIS — R070 Pain in throat: Secondary | ICD-10-CM | POA: Diagnosis not present

## 2018-04-03 DIAGNOSIS — R490 Dysphonia: Secondary | ICD-10-CM | POA: Diagnosis not present

## 2018-04-03 DIAGNOSIS — H6123 Impacted cerumen, bilateral: Secondary | ICD-10-CM | POA: Diagnosis not present

## 2018-04-10 DIAGNOSIS — F4322 Adjustment disorder with anxiety: Secondary | ICD-10-CM | POA: Diagnosis not present

## 2018-04-10 DIAGNOSIS — G20 Parkinson's disease: Secondary | ICD-10-CM | POA: Diagnosis not present

## 2018-04-10 DIAGNOSIS — R443 Hallucinations, unspecified: Secondary | ICD-10-CM | POA: Diagnosis not present

## 2018-04-12 ENCOUNTER — Encounter: Payer: Self-pay | Admitting: Family Medicine

## 2018-04-12 DIAGNOSIS — G20 Parkinson's disease: Secondary | ICD-10-CM | POA: Diagnosis not present

## 2018-04-12 DIAGNOSIS — R269 Unspecified abnormalities of gait and mobility: Secondary | ICD-10-CM | POA: Diagnosis not present

## 2018-04-12 DIAGNOSIS — K5909 Other constipation: Secondary | ICD-10-CM | POA: Diagnosis not present

## 2018-04-12 DIAGNOSIS — F419 Anxiety disorder, unspecified: Secondary | ICD-10-CM | POA: Diagnosis not present

## 2018-04-13 ENCOUNTER — Encounter: Payer: Self-pay | Admitting: Family Medicine

## 2018-04-13 ENCOUNTER — Encounter: Payer: Medicare Other | Admitting: Family Medicine

## 2018-04-13 ENCOUNTER — Ambulatory Visit (INDEPENDENT_AMBULATORY_CARE_PROVIDER_SITE_OTHER): Payer: Medicare Other | Admitting: Family Medicine

## 2018-04-13 VITALS — BP 130/74 | HR 68 | Temp 98.2°F | Resp 16

## 2018-04-13 DIAGNOSIS — F329 Major depressive disorder, single episode, unspecified: Secondary | ICD-10-CM | POA: Diagnosis not present

## 2018-04-13 DIAGNOSIS — G20 Parkinson's disease: Secondary | ICD-10-CM

## 2018-04-13 DIAGNOSIS — H539 Unspecified visual disturbance: Secondary | ICD-10-CM | POA: Diagnosis not present

## 2018-04-13 MED ORDER — MIRTAZAPINE 15 MG PO TABS: 15.0000 mg | ORAL_TABLET | Freq: Every day | ORAL | 2 refills | Status: DC

## 2018-04-13 NOTE — Patient Instructions (Signed)
Increase Remeron to 15mg  nightly  No other medication changes

## 2018-04-13 NOTE — Assessment & Plan Note (Signed)
Uncontrolled Has previously been intolerant to medications She has upcoming appt with [facility] to discuss further management I do believe that her depression stems from her chronic illness and inability to function as she was like to

## 2018-04-13 NOTE — Progress Notes (Signed)
Patient: Whitney Hoffman Female    DOB: 08/06/1924   84 y.o.   MRN: [id number] Visit Date: 04/13/2018  Today's Provider: Lavon Paganini, MD   I, Martha Clan, CMA, am acting as scribe for Lavon Paganini, MD.  Chief Complaint  Patient presents with  . Depression  . Eye Problem   Subjective:    HPI     Depression, Follow-up  She  was last seen for this 4 weeks ago. Changes made at last visit include adding Remeron 7.5 mg po qhs.   She reports good compliance with treatment. She is not having side effects.   She reports good tolerance of treatment. Current symptoms include: depressed mood She feels she is Unchanged since last visit.  Her mood is better, but she continues to feel bad about herself as she is unable to do things she would like to do due to Parkinsons disease.  She does occasionally feel as though she would be better off dead, but has no suicidal thoughts and desires to live to be with her family.  She does get occasionally tearful at seemingly small things. Her sleep has improved, and she is no longer having daytime somnolence.  ------------------------------------------------------------------------   Follow up for Visual Disturbances  The patient was last seen for this 4 weeks ago. Was unclear if this was secondary to macular degeneration vs hallucinations.  She was referred to neurology for Parkinson's, and was started on Remeron for depression. She has this appointment scheduled with neurology in September.  She states this is less frequent, but still sees "reflections every once in while". She states she sees spots and flowers.  ------------------------------------------------------------------------------------ Depression screen [facility] 2/9 04/13/2018 01/09/2018 12/21/2016 11/30/2016 11/24/2015  Decreased Interest 0 3 0 1 0  Down, Depressed, Hopeless 1 2 0 1 0  PHQ - 2 Score 1 5 0 2 0  Altered sleeping 0 2 - 3 -  Tired, decreased energy 1 3 - 3  -  Change in appetite 0 0 - 3 -  Feeling bad or failure about yourself  3 1 - 2 -  Trouble concentrating 0 0 - 0 -  Moving slowly or fidgety/restless 0 3 - 0 -  Suicidal thoughts 1 0 - 0 -  PHQ-9 Score 6 14 - 13 -  Difficult doing work/chores Somewhat difficult Not difficult at all - Not difficult at all -      Allergies  Allergen Reactions  . Codeine     Feel dizzy  . Meperidine Hcl     Sick on stomach   . Morphine     Nausea/sick on stomach  . Propoxyphene   . Aspirin Nausea Only    Sick on stomach  . Diazepam Nausea Only    Sick on stomach  . Meperidine Nausea Only  . Oxycodone-Acetaminophen Nausea Only     Current Outpatient Medications:  .  Calcium Carbonate-Vitamin D (CALCIUM 600+D HIGH POTENCY PO), Take 1 tablet 2 (two) times daily by mouth., Disp: , Rfl:  .  cetirizine (ZYRTEC) 10 MG tablet, Take 1 tablet (10 mg total) by mouth daily as needed for allergies., Disp: 30 tablet, Rfl: 11 .  Cholecalciferol (VITAMIN D-3) 5000 units TABS, Take 1 tablet by mouth daily., Disp: , Rfl:  .  Cyanocobalamin (VITAMIN B-12) 5000 MCG SUBL, Place 1 tablet daily under the tongue. , Disp: , Rfl:  .  docusate sodium (COLACE) 100 MG capsule, One capsule every morning and one capsule every  evening, Disp: 60 capsule, Rfl: 12 .  ezetimibe (ZETIA) 10 MG tablet, Take 1 tablet (10 mg total) by mouth daily., Disp: 30 tablet, Rfl: 5 .  loperamide (IMODIUM A-D) 2 MG tablet, One tablet by mouth as needed for diarhea up to 3 tablets in 24 hours, Disp: 30 tablet, Rfl: 12 .  metoprolol (LOPRESSOR) 50 MG tablet, TAKE 2 TABLETS BY MOUTH ONCE EVERY MORNING AND TAKE 1 TABLET BY MOUTHEVERYEVENING (Patient taking differently: TAKE 1 TABLET BY MOUTH ONCE EVERY EVENING), Disp: 90 tablet, Rfl: 3 .  Milk Thistle 175 MG CAPS, Take by mouth daily. , Disp: , Rfl:  .  mirabegron ER (MYRBETRIQ) 25 MG TB24 tablet, Take 1 tablet (25 mg total) by mouth daily., Disp: 30 tablet, Rfl: 11 .  mirtazapine (REMERON) 15 MG  tablet, Take 1 tablet (15 mg total) by mouth at bedtime., Disp: 30 tablet, Rfl: 2 .  polyethylene glycol powder (GLYCOLAX/MIRALAX) powder, Take 17 g 2 (two) times daily as needed by mouth., Disp: 3350 g, Rfl: 5 .  PREMARIN vaginal cream, APPLY 1 APPLICATOR VAGINALLY 3 NIGHTS A WEEK, Disp: 30 g, Rfl: PRN .  Rivaroxaban (XARELTO) 15 MG TABS tablet, Take 1 tablet (15 mg total) by mouth daily with supper., Disp: 30 tablet, Rfl: 6  Review of Systems  Constitutional: Negative for activity change, appetite change, chills, diaphoresis, fatigue, fever and unexpected weight change.  Neurological: Positive for tremors.  Psychiatric/Behavioral: Positive for dysphoric mood and hallucinations. Negative for sleep disturbance.    Social History   Tobacco Use  . Smoking status: Never Smoker  . Smokeless tobacco: Never Used  . Tobacco comment: tobacco use- no   Substance Use Topics  . Alcohol use: No   Objective:   BP 130/74 (BP Location: Left Arm, Patient Position: Sitting, Cuff Size: Normal)   Pulse 68   Temp 98.2 F (36.8 C) (Oral)   Resp 16   SpO2 97%  Vitals:   04/13/18 0956  BP: 130/74  Pulse: 68  Resp: 16  Temp: 98.2 F (36.8 C)  TempSrc: Oral  SpO2: 97%     Physical Exam  Constitutional: She is oriented to person, place, and time. She appears well-developed and well-nourished.  HENT:  Head: Normocephalic and atraumatic.  Eyes: Conjunctivae are normal. No scleral icterus.  Cardiovascular: Normal rate and regular rhythm.  Pulmonary/Chest: Effort normal. No respiratory distress. She has no wheezes.  Abdominal: Soft. She exhibits no distension. There is no tenderness.  Musculoskeletal: She exhibits no edema.  Neurological: She is alert and oriented to person, place, and time.  Skin: Skin is warm and dry. Capillary refill takes less than 2 seconds. No rash noted.  Psychiatric: She has a normal mood and affect. Her behavior is normal. Thought content normal.  Vitals  reviewed.       Assessment & Plan:   Problem List Items Addressed This Visit      Nervous and Auditory   Parkinson's disease ([place]) - Primary    Uncontrolled Has previously been intolerant to medications She has upcoming appt with [facility] to discuss further management I do believe that her depression stems from her chronic illness and inability to function as she was like to        Other   Depression    Improving since last visit Related to chronic illness Tolerating Remeron well and having good improvement Increase dose to 15 mg nightly to improve symptoms Patient will be following up with her new PCP in 1  month      Relevant Medications   mirtazapine (REMERON) 15 MG tablet   Visual disturbance    Stable, perhaps less frequent than last time This does seem to be a hallucination which may be related to her Parkinson's disease She has a follow-up with [facility] coming up next month She is also followed regularly by ophthalmology She is not a danger to herself or others and knows that these visual disturbances are not real          Return if symptoms worsen or fail to improve.  Patient will be following up with new PCP ([facility])   The entirety of the information documented in the History of Present Illness, Review of Systems and Physical Exam were personally obtained by me. Portions of this information were initially documented by Raquel Sarna Ratchford, CMA and reviewed by me for thoroughness and accuracy.    Virginia Crews, MD, MPH [facility] 04/13/2018 11:17 AM

## 2018-04-13 NOTE — Assessment & Plan Note (Signed)
Stable, perhaps less frequent than last time This does seem to be a hallucination which may be related to her Parkinson's disease She has a follow-up with [facility] coming up next month She is also followed regularly by ophthalmology She is not a danger to herself or others and knows that these visual disturbances are not real

## 2018-04-13 NOTE — Assessment & Plan Note (Signed)
Improving since last visit Related to chronic illness Tolerating Remeron well and having good improvement Increase dose to 15 mg nightly to improve symptoms Patient will be following up with her new PCP in 1 month

## 2018-04-16 DIAGNOSIS — Z79899 Other long term (current) drug therapy: Secondary | ICD-10-CM | POA: Diagnosis not present

## 2018-04-26 DIAGNOSIS — I4891 Unspecified atrial fibrillation: Secondary | ICD-10-CM | POA: Diagnosis not present

## 2018-04-26 DIAGNOSIS — G20 Parkinson's disease: Secondary | ICD-10-CM | POA: Diagnosis not present

## 2018-04-26 DIAGNOSIS — R269 Unspecified abnormalities of gait and mobility: Secondary | ICD-10-CM | POA: Diagnosis not present

## 2018-04-26 DIAGNOSIS — Z79899 Other long term (current) drug therapy: Secondary | ICD-10-CM | POA: Diagnosis not present

## 2018-04-26 DIAGNOSIS — Q845 Enlarged and hypertrophic nails: Secondary | ICD-10-CM | POA: Diagnosis not present

## 2018-04-27 NOTE — Telephone Encounter (Signed)
error 

## 2018-05-09 IMAGING — CR DG LUMBAR SPINE 2-3V
3 series · 3 of 3 positions shown · non-contrast
Comparison: 01/02/2017

CLINICAL DATA: Low back pain for 1 week, no known injury, initial
encounter

EXAM:
LUMBAR SPINE - 2-3 VIEW

[l-spine ap]
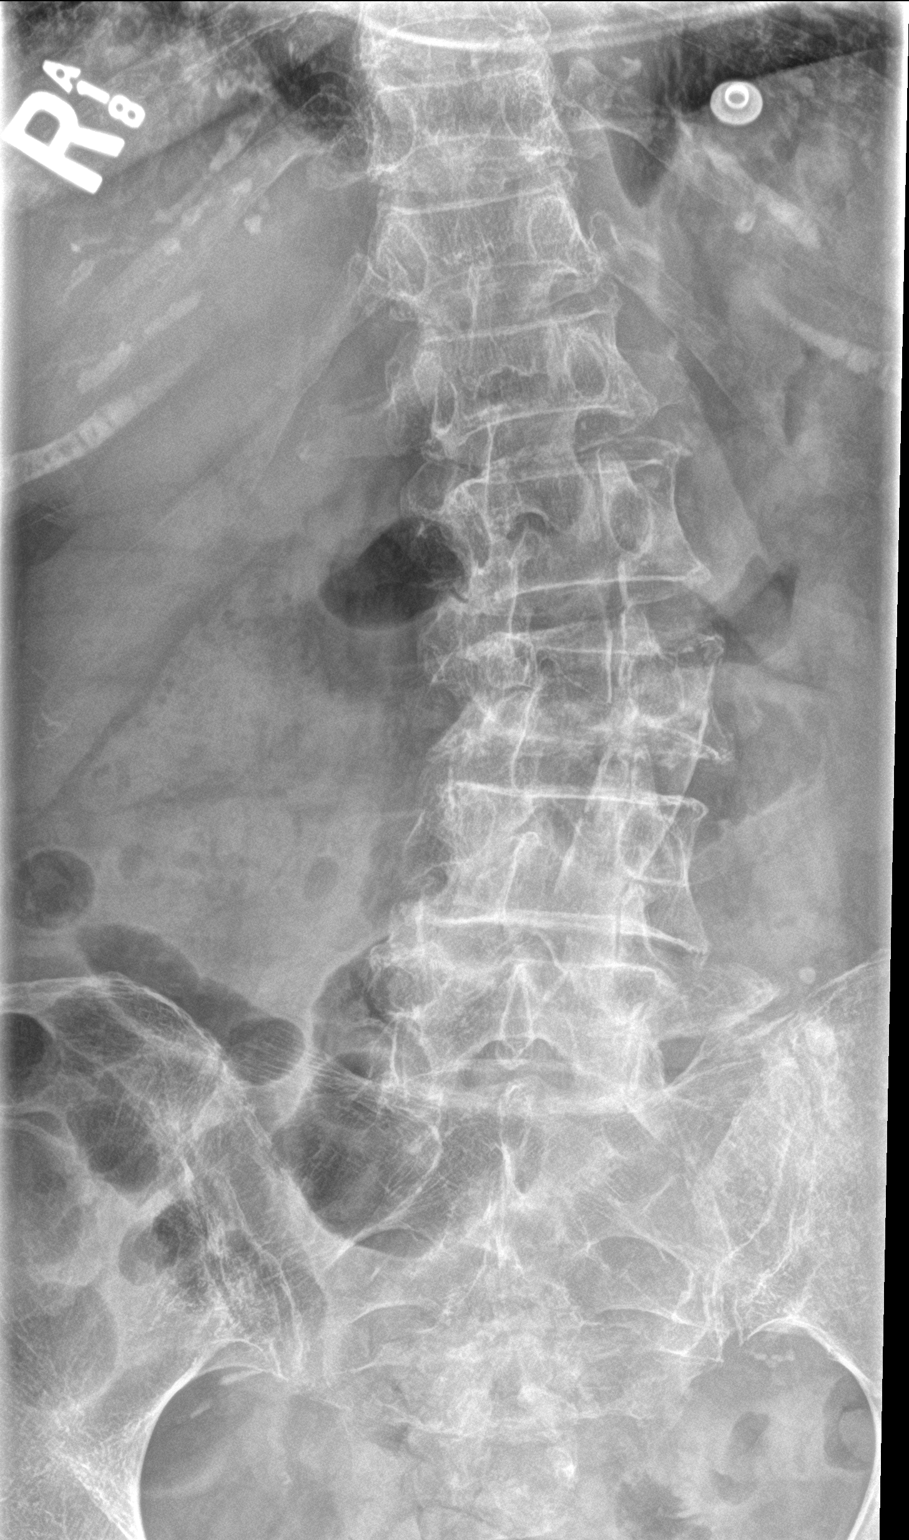

[l-spine lat]
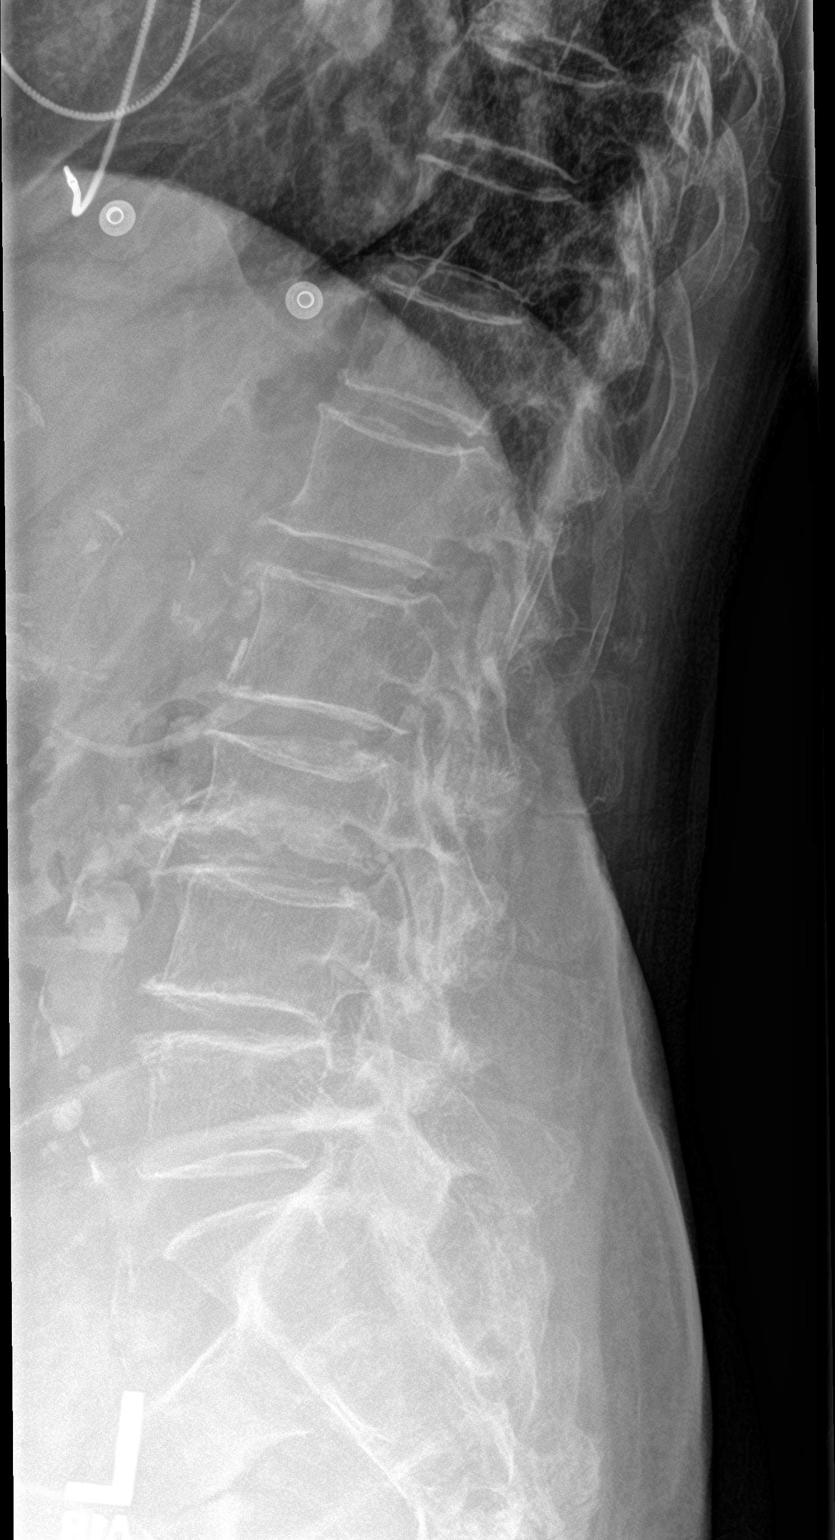

[l-spine spot]
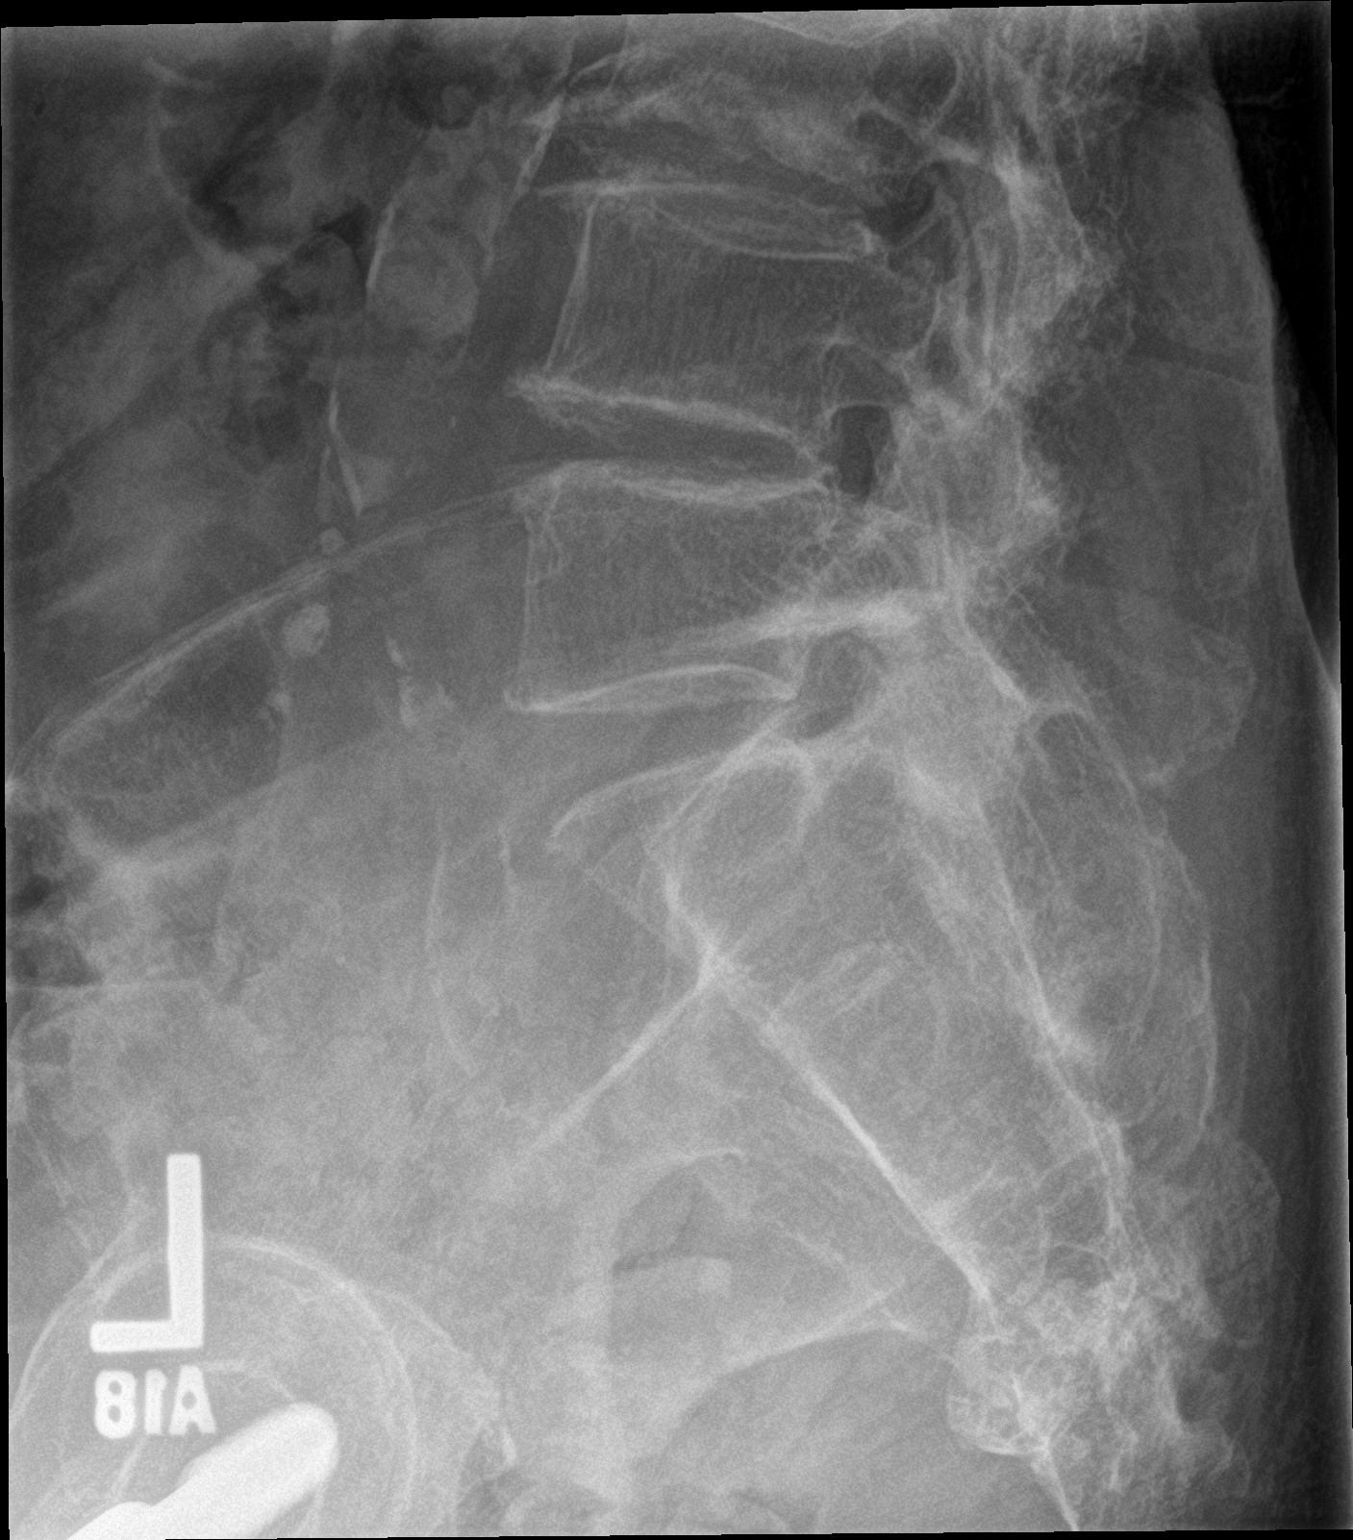

[3 of 3 positions shown; findings below may reference images not displayed]

FINDINGS: Five lumbar type vertebral bodies are well visualized. There is a
inferior endplate compression deformity at L3 which is new from the
prior exam but of uncertain age. Given the patient's history of
nonemergent MRI may be helpful for evaluation of the chronicity of
this compression fracture. Mild osteophytic changes are seen.
Diffuse aortic calcifications are noted.
IMPRESSION: L3 compression deformity. This is new from the prior exam of
01/02/2017 but of uncertain chronicity. Nonemergent MRI may be
helpful for further evaluation.

## 2018-05-14 DIAGNOSIS — G20 Parkinson's disease: Secondary | ICD-10-CM | POA: Diagnosis not present

## 2018-05-14 DIAGNOSIS — Q845 Enlarged and hypertrophic nails: Secondary | ICD-10-CM | POA: Diagnosis not present

## 2018-05-14 DIAGNOSIS — R269 Unspecified abnormalities of gait and mobility: Secondary | ICD-10-CM | POA: Diagnosis not present

## 2018-05-14 DIAGNOSIS — I4891 Unspecified atrial fibrillation: Secondary | ICD-10-CM | POA: Diagnosis not present

## 2018-05-15 DIAGNOSIS — G20 Parkinson's disease: Secondary | ICD-10-CM | POA: Diagnosis not present

## 2018-05-15 DIAGNOSIS — F419 Anxiety disorder, unspecified: Secondary | ICD-10-CM | POA: Diagnosis not present

## 2018-05-15 DIAGNOSIS — R443 Hallucinations, unspecified: Secondary | ICD-10-CM | POA: Diagnosis not present

## 2018-05-23 ENCOUNTER — Ambulatory Visit (INDEPENDENT_AMBULATORY_CARE_PROVIDER_SITE_OTHER): Payer: Medicare Other | Admitting: *Deleted

## 2018-05-23 ENCOUNTER — Telehealth: Payer: Self-pay | Admitting: Cardiology

## 2018-05-23 DIAGNOSIS — I495 Sick sinus syndrome: Secondary | ICD-10-CM

## 2018-05-23 NOTE — Telephone Encounter (Signed)
LMOVM reminding pt to send remote transmission.   

## 2018-05-24 NOTE — Progress Notes (Signed)
Remote pacemaker transmission.   

## 2018-05-25 DIAGNOSIS — R441 Visual hallucinations: Secondary | ICD-10-CM | POA: Diagnosis not present

## 2018-05-25 DIAGNOSIS — G20 Parkinson's disease: Secondary | ICD-10-CM | POA: Diagnosis not present

## 2018-05-25 DIAGNOSIS — F0281 Dementia in other diseases classified elsewhere with behavioral disturbance: Secondary | ICD-10-CM | POA: Diagnosis not present

## 2018-05-25 DIAGNOSIS — F060 Psychotic disorder with hallucinations due to known physiological condition: Secondary | ICD-10-CM | POA: Diagnosis not present

## 2018-06-02 LAB — CUP PACEART REMOTE DEVICE CHECK
Battery Remaining Longevity: 55 mo
Battery Voltage: 2.77 V
Brady Statistic RV Percent Paced: 25 %
Implantable Lead Implant Date: 20111108
Implantable Lead Location: 753859
Implantable Lead Location: 753860
Implantable Lead Model: 5076
Implantable Lead Model: 5076
Lead Channel Impedance Value: 533 Ohm
Lead Channel Pacing Threshold Amplitude: 0.75 V
Lead Channel Sensing Intrinsic Amplitude: 11.2 mV
Lead Channel Setting Pacing Amplitude: 2.5 V
MDC IDC LEAD IMPLANT DT: 20111108
MDC IDC MSMT BATTERY IMPEDANCE: 1255 Ohm
MDC IDC MSMT LEADCHNL RA IMPEDANCE VALUE: 67 Ohm
MDC IDC MSMT LEADCHNL RV PACING THRESHOLD PULSEWIDTH: 0.4 ms
MDC IDC PG IMPLANT DT: 20111108
MDC IDC SESS DTM: 20191002194709
MDC IDC SET LEADCHNL RV PACING PULSEWIDTH: 0.4 ms
MDC IDC SET LEADCHNL RV SENSING SENSITIVITY: 4 mV

## 2018-06-14 ENCOUNTER — Ambulatory Visit: Payer: Self-pay | Admitting: Family Medicine

## 2018-06-15 DIAGNOSIS — Z23 Encounter for immunization: Secondary | ICD-10-CM | POA: Diagnosis not present

## 2018-06-18 DIAGNOSIS — I4891 Unspecified atrial fibrillation: Secondary | ICD-10-CM | POA: Diagnosis not present

## 2018-06-18 DIAGNOSIS — N3941 Urge incontinence: Secondary | ICD-10-CM | POA: Diagnosis not present

## 2018-06-18 DIAGNOSIS — G20 Parkinson's disease: Secondary | ICD-10-CM | POA: Diagnosis not present

## 2018-06-18 DIAGNOSIS — R269 Unspecified abnormalities of gait and mobility: Secondary | ICD-10-CM | POA: Diagnosis not present

## 2018-06-19 DIAGNOSIS — R443 Hallucinations, unspecified: Secondary | ICD-10-CM | POA: Diagnosis not present

## 2018-06-19 DIAGNOSIS — F419 Anxiety disorder, unspecified: Secondary | ICD-10-CM | POA: Diagnosis not present

## 2018-06-19 DIAGNOSIS — G20 Parkinson's disease: Secondary | ICD-10-CM | POA: Diagnosis not present

## 2018-07-10 DIAGNOSIS — F5102 Adjustment insomnia: Secondary | ICD-10-CM | POA: Diagnosis not present

## 2018-07-10 DIAGNOSIS — F419 Anxiety disorder, unspecified: Secondary | ICD-10-CM | POA: Diagnosis not present

## 2018-07-10 DIAGNOSIS — R443 Hallucinations, unspecified: Secondary | ICD-10-CM | POA: Diagnosis not present

## 2018-07-10 DIAGNOSIS — G20 Parkinson's disease: Secondary | ICD-10-CM | POA: Diagnosis not present

## 2018-07-30 DIAGNOSIS — I4891 Unspecified atrial fibrillation: Secondary | ICD-10-CM | POA: Diagnosis not present

## 2018-07-30 DIAGNOSIS — E785 Hyperlipidemia, unspecified: Secondary | ICD-10-CM | POA: Diagnosis not present

## 2018-07-30 DIAGNOSIS — G20 Parkinson's disease: Secondary | ICD-10-CM | POA: Diagnosis not present

## 2018-07-30 DIAGNOSIS — R269 Unspecified abnormalities of gait and mobility: Secondary | ICD-10-CM | POA: Diagnosis not present

## 2018-08-20 ENCOUNTER — Telehealth: Payer: Self-pay | Admitting: Internal Medicine

## 2018-08-20 DIAGNOSIS — Z79899 Other long term (current) drug therapy: Secondary | ICD-10-CM | POA: Diagnosis not present

## 2018-08-20 NOTE — Telephone Encounter (Signed)
Pt c/o BP issue: STAT if pt c/o blurred vision, one-sided weakness or slurred speech  1. What are your last 5 BP readings? 160/87  2. Are you having any other symptoms (ex. Dizziness, headache, blurred vision, passed out)? Denies   3. What is your BP issue? Elevated daughter wants asap appt.  Scheduled 1/2 with Thurmond Butts at 2 .  Daughter states she doesn't feel like this is soon enough for a patient with high bp issues

## 2018-08-20 NOTE — Telephone Encounter (Signed)
LMTCB

## 2018-08-21 ENCOUNTER — Other Ambulatory Visit: Payer: Self-pay | Admitting: Family Medicine

## 2018-08-21 NOTE — Telephone Encounter (Signed)
Call from daughter this morning is very concerned about inc in BP. She was seen by [facility] for c/o sore throat that started on Saturday. On Saturday she had vs taken and noted that BP was elevated and has been requesting that techs take it several times a day. Yesterday reading was 160/87.  I called the nursing home where she lives and spoke with Sharee Pimple, the med tech on duty.  She confirmed that metoprolol is being taken as ordered, 100 mg total in am, 50 mg total in PM.  She denies sx of headache or blurry vision.  Sharee Pimple reported that BP was taken this am right after medication was administered and got a reading of 176/89. Tech took it again at 10:30 and BP was 130/80 manual.    I suggested to Lynd that BP be checked 1 hr after medication is administered. She verbalized understanding.   I made a call back to daughter with all information.  I left a message on cell, per daughters request.

## 2018-08-21 NOTE — Progress Notes (Signed)
Cardiology Office Note Date:  08/23/2018  Patient ID:  Whitney Hoffman, DOB 1924/04/12, MRN [id number] PCP:  Orvis Brill, [facility]  [facility]iologist:  Dr. Caryl Comes, MD    Chief Complaint: Elevated BP reading  History of Present Illness: Whitney Hoffman is a 84 y.o. female with history of tachy-brady syndrome s/p MDT PPM implanted in 2011 followed by Dr. Caryl Comes, persistent Afib on Xarelto, reported HFpEF, HTN, HLD, and Parkinson's disease who presents for evaluation of elevated BP reading.  No prior echoes or ischemic evaluations on file for review. Last device interrogation from 05/2018 showed 4.5 years of longevity with no changes made. She was last seen by her primary cardiologist on 12/05/2017 with a BP of 122/76 and a weight of 148 pounds. No changes were made.   Labs: 11/2017 - SCr 0.84, K+ 4.1, normal CBC (CrCl 45.27 from 11/2017 data) 02/2017 - LDL 79, LFT normal  Patient comes in accompanied by her daughter today.  Per the patient and her daughter, the patient has a long history of internalizing stressful situations.  Earlier in the week prior, the patient was notified she would be having to leave her private room at her current living facility and be relocated to a semi-private room.  However, this would still be a private room as the patient would not have a roommate.  In this setting the patient became very anxious and worked up.  While her blood pressure was being checked she had several readings in the 510C to 585I systolic which concerned to the patient.  However, since then, the patient has come to terms with her move and blood pressure has since normalized running in the 130s to 40s over the 70s to 80s.  She denies any chest pain, shortness of breath, lower extremity swelling, abdominal distention, orthopnea, PND, or early satiety.  No palpitations.  No dizziness, presyncope, or syncope.  The patient feels well today and has no other complaints.  Past Medical History:  Diagnosis Date    . Atrial fibrillation ([place])   . Hyperlipidemia   . Hypertension   . Parkinson disease ([place])   . Tachy-brady syndrome ([place])    with medtronic dual chambe rPCM    Past Surgical History:  Procedure Laterality Date  . ABDOMINAL HYSTERECTOMY  1966   fibroids ovaries removed secondary to ovarian cyst  . APPENDECTOMY  1944  . baldder tack     with her hysterectomy  . BREAST BIOPSY  2010   left benign repeat ultrasound recommneded in 2011 [facility] [place]    . CHOLECYSTECTOMY  1969  . GALLBLADDER SURGERY    . HIP SURGERY Left    ORIF x 2; post op infection Left hip  . PACEMAKER PLACEMENT  06/29/2010  . WRIST SURGERY  1996   Traumatic B    Current Meds  Medication Sig  . Calcium Carbonate-Vitamin D (CALCIUM 600+D HIGH POTENCY PO) Take 1 tablet 2 (two) times daily by mouth.  . cetirizine (ZYRTEC) 10 MG tablet Take 1 tablet (10 mg total) by mouth daily as needed for allergies.  . Cholecalciferol (VITAMIN D-3) 5000 units TABS Take 1 tablet by mouth daily.  . Cyanocobalamin (VITAMIN B-12) 5000 MCG SUBL Place 1 tablet daily under the tongue.   . docusate sodium (COLACE) 100 MG capsule One capsule every morning and one capsule every evening  . ezetimibe (ZETIA) 10 MG tablet Take 1 tablet (10 mg total) by mouth daily.  Marland Kitchen loperamide (IMODIUM A-D) 2  MG tablet One tablet by mouth as needed for diarhea up to 3 tablets in 24 hours  . metoprolol (LOPRESSOR) 50 MG tablet TAKE 2 TABLETS BY MOUTH ONCE EVERY MORNING AND TAKE 1 TABLET BY MOUTHEVERYEVENING (Patient taking differently: TAKE 1 TABLET BY MOUTH ONCE EVERY EVENING)  . Milk Thistle 175 MG CAPS Take by mouth daily.   . mirabegron ER (MYRBETRIQ) 25 MG TB24 tablet Take 1 tablet (25 mg total) by mouth daily.  . mirtazapine (REMERON) 15 MG tablet TAKE 1 TABLET BY MOUTH AT BEDTIME  . polyethylene glycol powder (GLYCOLAX/MIRALAX) powder Take 17 g 2 (two) times daily as needed by mouth.  Marland Kitchen PREMARIN vaginal cream APPLY 1  APPLICATOR VAGINALLY 3 NIGHTS A WEEK  . Rivaroxaban (XARELTO) 15 MG TABS tablet Take 1 tablet (15 mg total) by mouth daily with supper.    Allergies:   Codeine; Meperidine hcl; Morphine; Propoxyphene; Aspirin; Diazepam; Meperidine; and Oxycodone-acetaminophen   Social History:  The patient  reports that she has never smoked. She has never used smokeless tobacco. She reports that she does not drink alcohol or use drugs.   Family History:  The patient's family history includes Alzheimer's disease in her father; Coronary artery disease in her father; Diabetes in her mother; Heart attack in her father.  ROS:   Review of Systems  Constitutional: Positive for malaise/fatigue. Negative for chills, diaphoresis, fever and weight loss.  HENT: Negative for congestion.   Eyes: Negative for discharge and redness.  Respiratory: Negative for cough, hemoptysis, sputum production, shortness of breath and wheezing.   Cardiovascular: Negative for chest pain, palpitations, orthopnea, claudication, leg swelling and PND.  Gastrointestinal: Negative for abdominal pain, blood in stool, heartburn, melena, nausea and vomiting.  Genitourinary: Negative for hematuria.  Musculoskeletal: Negative for falls and myalgias.  Skin: Negative for rash.  Neurological: Positive for weakness. Negative for dizziness, tingling, tremors, sensory change, speech change, focal weakness and loss of consciousness.  Endo/Heme/Allergies: Does not bruise/bleed easily.  Psychiatric/Behavioral: Negative for substance abuse. The patient is nervous/anxious.   All other systems reviewed and are negative.    PHYSICAL EXAM:  VS:  BP 130/74 (BP Location: Right Arm, Patient Position: Sitting, Cuff Size: Normal)   Pulse 74   Ht 5\' 1"  (1.549 m)   Wt 162 lb (73.5 kg)   BMI 30.61 kg/m  BMI: Body mass index is 30.61 kg/m.  Physical Exam  Constitutional: She is oriented to person, place, and time. She appears well-developed and well-nourished.    HENT:  Head: Normocephalic and atraumatic.  Eyes: Right eye exhibits no discharge. Left eye exhibits no discharge.  Neck: Normal range of motion. No JVD present.  Cardiovascular: Normal rate, regular rhythm, S1 normal, S2 normal and normal heart sounds. Exam reveals no distant heart sounds, no friction rub, no midsystolic click and no opening snap.  No murmur heard. Pulses:      Posterior tibial pulses are 2+ on the right side and 2+ on the left side.  Pulmonary/Chest: Effort normal and breath sounds normal. No respiratory distress. She has no decreased breath sounds. She has no wheezes. She has no rales. She exhibits no tenderness.  Abdominal: Soft. She exhibits no distension. There is no abdominal tenderness.  Musculoskeletal:        General: No edema.  Neurological: She is alert and oriented to person, place, and time.  Skin: Skin is warm and dry. No cyanosis. Nails show no clubbing.  Psychiatric: She has a normal mood and affect. Her speech  is normal and behavior is normal. Judgment and thought content normal.     EKG:  Was ordered and interpreted by me today. Shows Afib with intermittent demand pacing, 74 bpm  Recent Labs: 12/05/2017: BUN 25; Creatinine, Ser 0.84; Hemoglobin 14.4; Platelets 272; Potassium 4.1; Sodium 141  No results found for requested labs within last 8760 hours.   CrCl cannot be calculated (Patient's most recent lab result is older than the maximum 21 days allowed.).   Wt Readings from Last 3 Encounters:  08/23/18 162 lb (73.5 kg)  01/09/18 149 lb (67.6 kg)  12/25/17 144 lb (65.3 kg)     Other studies reviewed: Additional studies/records reviewed today include: summarized above  ASSESSMENT AND PLAN:  1. Hypertension: Patient had several isolated readings in the setting of increased stress and anxiety as outlined above.  Patient notes that she internalizes these issues which leads to transient spikes in her blood pressure.  After discussion with the  patient and her daughter, we have agreed to have a trial of as needed hydralazine 10 mg 3 times daily for systolic blood pressure greater than 150 mmHg.  She will otherwise continue Lopressor 50 mg in the morning and 25 mg in the evening.  2. Persistent A. Fib: Currently in a paced rhythm.  Continue Lopressor as above.  Continue Xarelto 15 mg daily given most recent creatinine clearance less than 50 mL/min.  Check BMP and CBC.  3. Tachy-brady syndrome: Device appears to be functioning normally.  Followed by EP.  4. Hyperlipidemia: LDL of 79 from 02/2017.  Remains on Zetia.  Disposition: F/u with Dr. Caryl Comes in 11/2018.  Current medicines are reviewed at length with the patient today.  The patient did not have any concerns regarding medicines.  Signed, Christell Faith, PA-C 08/23/2018 4:11 PM     [facility] [address] [place] [address] [place], [place] [address] [phone]

## 2018-08-23 ENCOUNTER — Encounter: Payer: Self-pay | Admitting: Physician Assistant

## 2018-08-23 ENCOUNTER — Ambulatory Visit (INDEPENDENT_AMBULATORY_CARE_PROVIDER_SITE_OTHER): Payer: Medicare Other | Admitting: Physician Assistant

## 2018-08-23 ENCOUNTER — Ambulatory Visit (INDEPENDENT_AMBULATORY_CARE_PROVIDER_SITE_OTHER): Payer: Medicare Other

## 2018-08-23 VITALS — BP 130/74 | HR 74 | Ht 61.0 in | Wt 162.0 lb

## 2018-08-23 DIAGNOSIS — I4811 Longstanding persistent atrial fibrillation: Secondary | ICD-10-CM | POA: Diagnosis not present

## 2018-08-23 DIAGNOSIS — Z79899 Other long term (current) drug therapy: Secondary | ICD-10-CM

## 2018-08-23 DIAGNOSIS — I10 Essential (primary) hypertension: Secondary | ICD-10-CM

## 2018-08-23 DIAGNOSIS — I495 Sick sinus syndrome: Secondary | ICD-10-CM

## 2018-08-23 DIAGNOSIS — E782 Mixed hyperlipidemia: Secondary | ICD-10-CM | POA: Diagnosis not present

## 2018-08-23 DIAGNOSIS — I4821 Permanent atrial fibrillation: Secondary | ICD-10-CM | POA: Diagnosis not present

## 2018-08-23 LAB — CUP PACEART REMOTE DEVICE CHECK
Battery Impedance: 1337 Ohm
Battery Voltage: 2.77 V
Brady Statistic RV Percent Paced: 26 %
Date Time Interrogation Session: 20200102162439
Implantable Lead Implant Date: 20111108
Implantable Lead Location: 753859
Implantable Lead Location: 753860
Implantable Lead Model: 5076
Lead Channel Impedance Value: 67 Ohm
Lead Channel Setting Pacing Amplitude: 2.5 V
Lead Channel Setting Pacing Pulse Width: 0.4 ms
Lead Channel Setting Sensing Sensitivity: 4 mV
MDC IDC LEAD IMPLANT DT: 20111108
MDC IDC MSMT BATTERY REMAINING LONGEVITY: 52 mo
MDC IDC MSMT LEADCHNL RV IMPEDANCE VALUE: 557 Ohm
MDC IDC MSMT LEADCHNL RV PACING THRESHOLD AMPLITUDE: 0.875 V
MDC IDC MSMT LEADCHNL RV PACING THRESHOLD PULSEWIDTH: 0.4 ms
MDC IDC PG IMPLANT DT: 20111108

## 2018-08-23 MED ORDER — HYDRALAZINE HCL 10 MG PO TABS: ORAL_TABLET | ORAL | 1 refills | Status: DC

## 2018-08-23 NOTE — Patient Instructions (Signed)
Medication Instructions:  - Your physician has recommended you make the following change in your medication:   1) START hydralazine 10 mg- take 1 tablet by mouth three times a day as needed for a systolic blood pressure (top number) > 150.  If you need a refill on your cardiac medications before your next appointment, please call your pharmacy.   Lab work: - Your physician recommends that you have lab work today: BMP/ CBC  If you have labs (blood work) drawn today and your tests are completely normal, you will receive your results only by: Marland Kitchen MyChart Message (if you have MyChart) OR . A paper copy in the mail If you have any lab test that is abnormal or we need to change your treatment, we will call you to review the results.  Testing/Procedures: - none ordered  Follow-Up: At [facility], you and your health needs are our priority.  As part of our continuing mission to provide you with exceptional heart care, we have created designated Provider Care Teams.  These Care Teams include your primary Cardiologist (physician) and Advanced Practice Providers (APPs -  Physician Assistants and Nurse Practitioners) who all work together to provide you with the care you need, when you need it. . You will need a follow up appointment in April 2020 with Dr. Caryl Comes  Please call our office 2 months in advance to schedule this appointment.    Any Other Special Instructions Will Be Listed Below (If Applicable). - N/A

## 2018-08-24 ENCOUNTER — Telehealth: Payer: Self-pay

## 2018-08-24 LAB — CBC WITH DIFFERENTIAL/PLATELET
BASOS ABS: 0.1 10*3/uL (ref 0.0–0.2)
BASOS: 1 %
EOS (ABSOLUTE): 0.1 10*3/uL (ref 0.0–0.4)
Eos: 2 %
HEMOGLOBIN: 13.6 g/dL (ref 11.1–15.9)
Hematocrit: 39.3 % (ref 34.0–46.6)
IMMATURE GRANS (ABS): 0 10*3/uL (ref 0.0–0.1)
IMMATURE GRANULOCYTES: 0 %
LYMPHS: 25 %
Lymphocytes Absolute: 1.7 10*3/uL (ref 0.7–3.1)
MCH: 31.7 pg (ref 26.6–33.0)
MCHC: 34.6 g/dL (ref 31.5–35.7)
MCV: 92 fL (ref 79–97)
MONOCYTES: 9 %
Monocytes Absolute: 0.6 10*3/uL (ref 0.1–0.9)
NEUTROS ABS: 4.2 10*3/uL (ref 1.4–7.0)
NEUTROS PCT: 63 %
PLATELETS: 261 10*3/uL (ref 150–450)
RBC: 4.29 x10E6/uL (ref 3.77–5.28)
RDW: 12.8 % (ref 12.3–15.4)
WBC: 6.7 10*3/uL (ref 3.4–10.8)

## 2018-08-24 LAB — BASIC METABOLIC PANEL
BUN/Creatinine Ratio: 25 (ref 12–28)
BUN: 28 mg/dL (ref 10–36)
CALCIUM: 9.2 mg/dL (ref 8.7–10.3)
CO2: 27 mmol/L (ref 20–29)
Chloride: 100 mmol/L (ref 96–106)
Creatinine, Ser: 1.14 mg/dL — ABNORMAL HIGH (ref 0.57–1.00)
GFR, EST AFRICAN AMERICAN: 48 mL/min/{1.73_m2} — AB (ref >59)
GFR, EST NON AFRICAN AMERICAN: 42 mL/min/{1.73_m2} — AB (ref >59)
Glucose: 85 mg/dL (ref 65–99)
Potassium: 3.8 mmol/L (ref 3.5–5.2)
Sodium: 143 mmol/L (ref 134–144)

## 2018-08-24 NOTE — Telephone Encounter (Signed)
LMTCB

## 2018-08-24 NOTE — Telephone Encounter (Signed)
Reviewed lab results with daughter, see verbalized understanding of increasing PO water intake. Advised pt to call for any further questions or concerns

## 2018-08-24 NOTE — Progress Notes (Signed)
Remote pacemaker transmission.   

## 2018-08-24 NOTE — Telephone Encounter (Signed)
-----   Message from Rise Mu, PA-C sent at 08/24/2018  7:17 AM EST ----- Renal function is elevated, needs to increase water intake. This will help with her labile BP as well.  Estimated Creatinine Clearance: 28.9 mL/min (A) (by C-G formula based on SCr of 1.14 mg/dL (H)).  Ok to remain on reduced dose Xarelto given CrCl less than 50.  Blood count normal.

## 2018-08-27 ENCOUNTER — Telehealth: Payer: Self-pay | Admitting: Physician Assistant

## 2018-08-27 NOTE — Telephone Encounter (Signed)
Please call caretaker regarding Hydralazine. States they need a signed order. Also, if they are to check her BP, this also needs a signed order.

## 2018-08-27 NOTE — Telephone Encounter (Signed)
Returned call to [place], Therapist, sports. She reported that she had found current order for Hydralazine PRN and did not need anything further with medications at this time.  She did inquire about BP order. I let her know that at this time we do not have order request for BP to be taken more than what is currently offered at her residence.  She will encourage pt to voice symptoms or concerns if bp should be taken more than offered.  Advised pt to call for any further questions or concerns

## 2018-09-03 DIAGNOSIS — I10 Essential (primary) hypertension: Secondary | ICD-10-CM | POA: Diagnosis not present

## 2018-09-03 DIAGNOSIS — I4891 Unspecified atrial fibrillation: Secondary | ICD-10-CM | POA: Diagnosis not present

## 2018-09-03 DIAGNOSIS — E785 Hyperlipidemia, unspecified: Secondary | ICD-10-CM | POA: Diagnosis not present

## 2018-09-03 DIAGNOSIS — N3941 Urge incontinence: Secondary | ICD-10-CM | POA: Diagnosis not present

## 2018-09-05 DIAGNOSIS — R443 Hallucinations, unspecified: Secondary | ICD-10-CM | POA: Diagnosis not present

## 2018-09-05 DIAGNOSIS — G20 Parkinson's disease: Secondary | ICD-10-CM | POA: Diagnosis not present

## 2018-09-05 DIAGNOSIS — F5102 Adjustment insomnia: Secondary | ICD-10-CM | POA: Diagnosis not present

## 2018-09-05 DIAGNOSIS — F419 Anxiety disorder, unspecified: Secondary | ICD-10-CM | POA: Diagnosis not present

## 2018-09-13 DIAGNOSIS — E559 Vitamin D deficiency, unspecified: Secondary | ICD-10-CM | POA: Diagnosis not present

## 2018-09-13 DIAGNOSIS — I10 Essential (primary) hypertension: Secondary | ICD-10-CM | POA: Diagnosis not present

## 2018-09-13 DIAGNOSIS — F0280 Dementia in other diseases classified elsewhere without behavioral disturbance: Secondary | ICD-10-CM | POA: Diagnosis not present

## 2018-09-13 DIAGNOSIS — Z6830 Body mass index (BMI) 30.0-30.9, adult: Secondary | ICD-10-CM | POA: Diagnosis not present

## 2018-09-13 DIAGNOSIS — E785 Hyperlipidemia, unspecified: Secondary | ICD-10-CM | POA: Diagnosis not present

## 2018-09-13 DIAGNOSIS — G20 Parkinson's disease: Secondary | ICD-10-CM | POA: Diagnosis not present

## 2018-09-13 DIAGNOSIS — I4821 Permanent atrial fibrillation: Secondary | ICD-10-CM | POA: Diagnosis not present

## 2018-09-13 DIAGNOSIS — M810 Age-related osteoporosis without current pathological fracture: Secondary | ICD-10-CM | POA: Diagnosis not present

## 2018-09-14 DIAGNOSIS — E785 Hyperlipidemia, unspecified: Secondary | ICD-10-CM | POA: Diagnosis not present

## 2018-09-14 DIAGNOSIS — I10 Essential (primary) hypertension: Secondary | ICD-10-CM | POA: Diagnosis not present

## 2018-09-14 DIAGNOSIS — F0280 Dementia in other diseases classified elsewhere without behavioral disturbance: Secondary | ICD-10-CM | POA: Diagnosis not present

## 2018-09-14 DIAGNOSIS — M810 Age-related osteoporosis without current pathological fracture: Secondary | ICD-10-CM | POA: Diagnosis not present

## 2018-09-14 DIAGNOSIS — I4821 Permanent atrial fibrillation: Secondary | ICD-10-CM | POA: Diagnosis not present

## 2018-09-14 DIAGNOSIS — G20 Parkinson's disease: Secondary | ICD-10-CM | POA: Diagnosis not present

## 2018-09-17 DIAGNOSIS — I10 Essential (primary) hypertension: Secondary | ICD-10-CM | POA: Diagnosis not present

## 2018-09-17 DIAGNOSIS — I4891 Unspecified atrial fibrillation: Secondary | ICD-10-CM | POA: Diagnosis not present

## 2018-09-17 DIAGNOSIS — M810 Age-related osteoporosis without current pathological fracture: Secondary | ICD-10-CM | POA: Diagnosis not present

## 2018-09-17 DIAGNOSIS — N3941 Urge incontinence: Secondary | ICD-10-CM | POA: Diagnosis not present

## 2018-09-17 DIAGNOSIS — F0280 Dementia in other diseases classified elsewhere without behavioral disturbance: Secondary | ICD-10-CM | POA: Diagnosis not present

## 2018-09-17 DIAGNOSIS — G20 Parkinson's disease: Secondary | ICD-10-CM | POA: Diagnosis not present

## 2018-09-17 DIAGNOSIS — I4821 Permanent atrial fibrillation: Secondary | ICD-10-CM | POA: Diagnosis not present

## 2018-09-17 DIAGNOSIS — E785 Hyperlipidemia, unspecified: Secondary | ICD-10-CM | POA: Diagnosis not present

## 2018-09-18 DIAGNOSIS — E785 Hyperlipidemia, unspecified: Secondary | ICD-10-CM | POA: Diagnosis not present

## 2018-09-18 DIAGNOSIS — G20 Parkinson's disease: Secondary | ICD-10-CM | POA: Diagnosis not present

## 2018-09-18 DIAGNOSIS — I739 Peripheral vascular disease, unspecified: Secondary | ICD-10-CM | POA: Diagnosis not present

## 2018-09-18 DIAGNOSIS — M810 Age-related osteoporosis without current pathological fracture: Secondary | ICD-10-CM | POA: Diagnosis not present

## 2018-09-18 DIAGNOSIS — F0280 Dementia in other diseases classified elsewhere without behavioral disturbance: Secondary | ICD-10-CM | POA: Diagnosis not present

## 2018-09-18 DIAGNOSIS — B351 Tinea unguium: Secondary | ICD-10-CM | POA: Diagnosis not present

## 2018-09-18 DIAGNOSIS — I4821 Permanent atrial fibrillation: Secondary | ICD-10-CM | POA: Diagnosis not present

## 2018-09-18 DIAGNOSIS — I10 Essential (primary) hypertension: Secondary | ICD-10-CM | POA: Diagnosis not present

## 2018-09-20 DIAGNOSIS — I4821 Permanent atrial fibrillation: Secondary | ICD-10-CM | POA: Diagnosis not present

## 2018-09-20 DIAGNOSIS — E785 Hyperlipidemia, unspecified: Secondary | ICD-10-CM | POA: Diagnosis not present

## 2018-09-20 DIAGNOSIS — I10 Essential (primary) hypertension: Secondary | ICD-10-CM | POA: Diagnosis not present

## 2018-09-20 DIAGNOSIS — F0280 Dementia in other diseases classified elsewhere without behavioral disturbance: Secondary | ICD-10-CM | POA: Diagnosis not present

## 2018-09-20 DIAGNOSIS — G20 Parkinson's disease: Secondary | ICD-10-CM | POA: Diagnosis not present

## 2018-09-20 DIAGNOSIS — M810 Age-related osteoporosis without current pathological fracture: Secondary | ICD-10-CM | POA: Diagnosis not present

## 2018-09-21 DIAGNOSIS — I10 Essential (primary) hypertension: Secondary | ICD-10-CM | POA: Diagnosis not present

## 2018-09-21 DIAGNOSIS — M810 Age-related osteoporosis without current pathological fracture: Secondary | ICD-10-CM | POA: Diagnosis not present

## 2018-09-21 DIAGNOSIS — E785 Hyperlipidemia, unspecified: Secondary | ICD-10-CM | POA: Diagnosis not present

## 2018-09-21 DIAGNOSIS — F0280 Dementia in other diseases classified elsewhere without behavioral disturbance: Secondary | ICD-10-CM | POA: Diagnosis not present

## 2018-09-21 DIAGNOSIS — I4821 Permanent atrial fibrillation: Secondary | ICD-10-CM | POA: Diagnosis not present

## 2018-09-21 DIAGNOSIS — G20 Parkinson's disease: Secondary | ICD-10-CM | POA: Diagnosis not present

## 2018-09-24 ENCOUNTER — Emergency Department: Payer: Medicare Other

## 2018-09-24 ENCOUNTER — Emergency Department
Admission: EM | Admit: 2018-09-24 | Discharge: 2018-09-25 | Disposition: A | Discharge status: Home / Self Care | Payer: Medicare Other | Attending: Emergency Medicine | Admitting: Emergency Medicine

## 2018-09-24 ENCOUNTER — Other Ambulatory Visit: Payer: Self-pay

## 2018-09-24 DIAGNOSIS — E785 Hyperlipidemia, unspecified: Secondary | ICD-10-CM | POA: Diagnosis not present

## 2018-09-24 DIAGNOSIS — Z79899 Other long term (current) drug therapy: Secondary | ICD-10-CM | POA: Diagnosis not present

## 2018-09-24 DIAGNOSIS — Y92129 Unspecified place in nursing home as the place of occurrence of the external cause: Secondary | ICD-10-CM | POA: Diagnosis not present

## 2018-09-24 DIAGNOSIS — C44391 Other specified malignant neoplasm of skin of nose: Secondary | ICD-10-CM | POA: Diagnosis not present

## 2018-09-24 DIAGNOSIS — M5489 Other dorsalgia: Secondary | ICD-10-CM | POA: Diagnosis not present

## 2018-09-24 DIAGNOSIS — I10 Essential (primary) hypertension: Secondary | ICD-10-CM | POA: Diagnosis not present

## 2018-09-24 DIAGNOSIS — S8992XA Unspecified injury of left lower leg, initial encounter: Secondary | ICD-10-CM | POA: Diagnosis not present

## 2018-09-24 DIAGNOSIS — S0990XA Unspecified injury of head, initial encounter: Secondary | ICD-10-CM | POA: Diagnosis not present

## 2018-09-24 DIAGNOSIS — I4821 Permanent atrial fibrillation: Secondary | ICD-10-CM | POA: Diagnosis not present

## 2018-09-24 DIAGNOSIS — G20 Parkinson's disease: Secondary | ICD-10-CM | POA: Diagnosis not present

## 2018-09-24 DIAGNOSIS — S199XXA Unspecified injury of neck, initial encounter: Secondary | ICD-10-CM | POA: Diagnosis not present

## 2018-09-24 DIAGNOSIS — Y999 Unspecified external cause status: Secondary | ICD-10-CM | POA: Diagnosis not present

## 2018-09-24 DIAGNOSIS — Z950 Presence of cardiac pacemaker: Secondary | ICD-10-CM | POA: Diagnosis not present

## 2018-09-24 DIAGNOSIS — R51 Headache: Secondary | ICD-10-CM | POA: Diagnosis not present

## 2018-09-24 DIAGNOSIS — F0280 Dementia in other diseases classified elsewhere without behavioral disturbance: Secondary | ICD-10-CM | POA: Diagnosis not present

## 2018-09-24 DIAGNOSIS — Y939 Activity, unspecified: Secondary | ICD-10-CM | POA: Insufficient documentation

## 2018-09-24 DIAGNOSIS — M545 Low back pain: Secondary | ICD-10-CM | POA: Diagnosis not present

## 2018-09-24 DIAGNOSIS — I5032 Chronic diastolic (congestive) heart failure: Secondary | ICD-10-CM | POA: Insufficient documentation

## 2018-09-24 DIAGNOSIS — W19XXXA Unspecified fall, initial encounter: Secondary | ICD-10-CM | POA: Diagnosis not present

## 2018-09-24 DIAGNOSIS — M25551 Pain in right hip: Secondary | ICD-10-CM | POA: Diagnosis not present

## 2018-09-24 DIAGNOSIS — I110 Hypertensive heart disease with heart failure: Secondary | ICD-10-CM | POA: Diagnosis not present

## 2018-09-24 DIAGNOSIS — M810 Age-related osteoporosis without current pathological fracture: Secondary | ICD-10-CM | POA: Diagnosis not present

## 2018-09-24 DIAGNOSIS — M25562 Pain in left knee: Secondary | ICD-10-CM | POA: Diagnosis not present

## 2018-09-24 DIAGNOSIS — S79911A Unspecified injury of right hip, initial encounter: Secondary | ICD-10-CM | POA: Diagnosis not present

## 2018-09-24 NOTE — ED Triage Notes (Signed)
Pt BIB [facility] from [facility] for unwitnessed mechanical fall, pt did hit posterior aspect of her head on carpeted floor, denies any LOC, denies use of blood thinners, is A&O x 4 in triage. Reports soreness to lower back but denies any neck pain at this time. Hx of parkinson's.

## 2018-09-25 NOTE — ED Notes (Signed)
MD back at bedside for update

## 2018-09-25 NOTE — Discharge Instructions (Signed)
You have been seen in the Emergency Department (ED) today for a fall.  Your work up does not show any concerning injuries.  Please take over-the-counter ibuprofen and/or Tylenol as needed for your pain (unless you have an allergy or your doctor as told you not to take them), or take any prescribed medication as instructed.  However, as result of your unsteadiness from the Parkinson's disease and the contusions you sustained from your fall including some pain in your left knee (which was present before the fall but seems worse now), some pain in your right hip in spite of the normal x-rays, and some pain in the back of your head, I believe you would benefit from level 3 care at [facility], at least until you can follow up with your primary care doctor and orthopedics.    Please follow up with your doctor regarding today's Emergency Department (ED) visit and your recent fall.    Return to the ED if you have any headache, confusion, slurred speech, weakness/numbness of any arm or leg, or any increased pain.

## 2018-09-25 NOTE — ED Notes (Addendum)
Report called to Vicente Males, med tech at [facility].

## 2018-09-25 NOTE — ED Provider Notes (Signed)
[facility] Emergency Department Provider Note  ____________________________________________   First MD Initiated Contact with Patient 09/24/18 2255     (approximate)  I have reviewed the triage vital signs and the nursing notes.   HISTORY  Chief Complaint Fall    HPI Whitney Hoffman is a 85 y.o. 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  She presents by EMS for evaluation after a fall.  She says that she was having a lot of tremor tonight from her Parkinson's disease and that when she was trying to get into bed she lost her balance and thinks her left knee may have given out.  She fell and struck the back of her head on the floor.  She did not lose consciousness.  She has no neck pain and has a little bit of tenderness in the back of her head.  She has no chest pain and no shortness of breath, no abdominal pain, a small amount of pain in her right hip and some pain in her left knee, but notably her family reports that her left knee has been hurting her for a period of time prior to the fall tonight.  Her family reports that she has a history of a thoracic spine injury and has had numerous orthopedic issues in the past due to her "brittle bones".  Her pain is currently mild but she is not sure that she will be able to bear weight.  Moving around seem to make the symptoms worse and holding still makes it better.  Past Medical History:  Diagnosis Date  . Atrial fibrillation ([place])   . Hyperlipidemia   . Hypertension   . Parkinson disease ([place])   . Tachy-brady syndrome ([place])    with medtronic dual chambe rPCM    Patient Active Problem List   Diagnosis Date Noted  . Visual disturbance 03/16/2018  . Chronic venous stasis 03/16/2018  . Depression 03/15/2018  . Vertebral compression fracture ([place]) 05/19/2017    . Back pain 11/30/2016  . Allergic rhinitis 06/15/2015  . Arthritis 06/15/2015  . History of colonic polyps 06/15/2015  . DDD (degenerative disc disease), lumbar 06/15/2015  . Fatigue 06/15/2015  . Irritable bladder 06/15/2015  . Left knee pain 06/15/2015  . Nevus 06/15/2015  . Parkinson's disease ([place]) 06/15/2015  . Peptic ulcer disease 06/15/2015  . Skin cancer of nose 06/15/2015  . Vertigo 06/15/2015  . Dyspnea on exertion 08/08/2013  . Chronic diastolic heart failure ([place]) 08/07/2012  . VENOUS HYPERTENSION, CHRONIC W/ INFLAMMATION 07/21/2010  . Essential hypertension 07/08/2010  . ATRIAL FIBRILLATION 07/08/2010  . PACEMAKER-Medtronic 07/08/2010  . Senile osteoporosis 10/18/2009  . Pure hypercholesterolemia 10/05/2009    Past Surgical History:  Procedure Laterality Date  . ABDOMINAL HYSTERECTOMY  1966   fibroids ovaries removed secondary to ovarian cyst  . APPENDECTOMY  1944  . baldder tack     with her hysterectomy  . BREAST BIOPSY  2010   left benign repeat ultrasound recommneded in 2011 [place]    . CHOLECYSTECTOMY  1969  . GALLBLADDER SURGERY    . HIP SURGERY Left    ORIF x 2; post op infection Left hip  . PACEMAKER PLACEMENT  06/29/2010  . WRIST SURGERY  1996   Traumatic B    Prior to Admission medications   Medication Sig Start Date End Date Taking?  Authorizing Provider  Calcium Carbonate-Vitamin D (CALCIUM 600+D HIGH POTENCY PO) Take 1 tablet 2 (two) times daily by mouth.    [provider]  cetirizine (ZYRTEC) 10 MG tablet Take 1 tablet (10 mg total) by mouth daily as needed for allergies. 01/09/18   Birdie Sons, MD  Cholecalciferol (VITAMIN D-3) 5000 units TABS Take 1 tablet by mouth daily.    [provider]  Cyanocobalamin (VITAMIN B-12) 5000 MCG SUBL Place 1 tablet daily under the tongue.     [provider]  docusate sodium (COLACE) 100 MG capsule One capsule every morning and one capsule  every evening 03/03/17   Birdie Sons, MD  ezetimibe (ZETIA) 10 MG tablet Take 1 tablet (10 mg total) by mouth daily. 11/30/16   Birdie Sons, MD  hydrALAZINE (APRESOLINE) 10 MG tablet Take 1 tablet (10 mg) by mouth three times a day as needed for a systolic blood pressure (top number) > 150. 08/23/18   Dunn, Areta Haber, PA-C  loperamide (IMODIUM A-D) 2 MG tablet One tablet by mouth as needed for diarhea up to 3 tablets in 24 hours 03/03/17   Birdie Sons, MD  metoprolol (LOPRESSOR) 50 MG tablet TAKE 2 TABLETS BY MOUTH ONCE EVERY MORNING AND TAKE 1 TABLET BY MOUTHEVERYEVENING Patient taking differently: TAKE 1 TABLET BY MOUTH ONCE EVERY EVENING 12/01/16   Deboraha Sprang, MD  Milk Thistle 175 MG CAPS Take by mouth daily.     [provider]  mirabegron ER (MYRBETRIQ) 25 MG TB24 tablet Take 1 tablet (25 mg total) by mouth daily. 05/12/17   Zara Council A, PA-C  mirtazapine (REMERON) 15 MG tablet TAKE 1 TABLET BY MOUTH AT BEDTIME 08/21/18   Bacigalupo, Dionne Bucy, MD  polyethylene glycol powder (GLYCOLAX/MIRALAX) powder Take 17 g 2 (two) times daily as needed by mouth. 06/25/17   Hinda Kehr, MD  PREMARIN vaginal cream APPLY 1 APPLICATOR VAGINALLY 3 NIGHTS A WEEK 10/18/17   McGowan, Larene Beach A, PA-C  Rivaroxaban (XARELTO) 15 MG TABS tablet Take 1 tablet (15 mg total) by mouth daily with supper. 12/01/16   Deboraha Sprang, MD    Allergies Codeine; Meperidine hcl; Morphine; Pramipexole; Propoxyphene; Sinemet [carbidopa w-levodopa]; Aspirin; Diazepam; Meperidine; and Oxycodone-acetaminophen  Family History  Problem Relation Age of Onset  . Diabetes Mother   . Coronary artery disease Father   . Heart attack Father   . Alzheimer's disease Father   . Kidney cancer Neg Hx   . Bladder Cancer Neg Hx     Social History Social History   Tobacco Use  . Smoking status: Never Smoker  . Smokeless tobacco: Never Used  . Tobacco comment: tobacco use- no   Substance Use Topics  . Alcohol  use: No  . Drug use: No    Review of Systems Constitutional: No fever/chills Eyes: No visual changes. ENT: No sore throat. Cardiovascular: Denies chest pain. Respiratory: Denies shortness of breath. Gastrointestinal: No abdominal pain.  No nausea, no vomiting.  No diarrhea.  No constipation. Genitourinary: Negative for dysuria. Musculoskeletal: Small amount of pain in the back of her head, right hip, and left knee, all as described above. Integumentary: Negative for rash. Neurological: Negative for headaches, focal weakness or numbness.   ____________________________________________   PHYSICAL EXAM:  VITAL SIGNS: ED Triage Vitals  Enc Vitals Group     BP 09/24/18 2226 (!) 147/95     Pulse Rate 09/24/18 2226 87     Resp 09/24/18 2226 16  Temp --      Temp src --      SpO2 09/24/18 2223 94 %     Weight 09/24/18 2229 73 kg (160 lb 15 oz)     Height 09/24/18 2229 1.549 m (5\' 1" )     Head Circumference --      Peak Flow --      Pain Score 09/24/18 2228 2     Pain Loc --      Pain Edu? --      Excl. in [place]? --     Constitutional: Alert and oriented. Well appearing and in no acute distress. Eyes: Conjunctivae are normal. PERRL. EOMI. Head: Atraumatic on visual inspection although the patient does have some tenderness to palpation of the occiput without any obvious gross abnormality or traumatic injury. Nose: No congestion/rhinnorhea. Mouth/Throat: Mucous membranes are moist. Neck: No stridor.  No meningeal signs.  No cervical spine tenderness to palpation. Cardiovascular: Normal rate, regular rhythm. Good peripheral circulation. Grossly normal heart sounds. Respiratory: Normal respiratory effort.  No retractions. Lungs CTAB. Gastrointestinal: Soft and nontender. No distention.  Musculoskeletal: The patient has some tenderness associated with the left knee, flexion extension, palpation, etc.  However there is no effusion and no obvious swelling of the left knee compared to  the right.  No evidence of acute traumatic injury.  No tenderness to palpation of the cervical, thoracic, nor lumbar spine. Neurologic:  Normal speech and language. No gross focal neurologic deficits are appreciated.  Skin:  Skin is warm, dry and intact. No rash noted. Psychiatric: Mood and affect are normal. Speech and behavior are normal.  ____________________________________________   LABS (all labs ordered are listed, but only abnormal results are displayed)  Labs Reviewed - No data to display ____________________________________________  EKG  No indication for EKG ____________________________________________  RADIOLOGY I, Hinda Kehr, personally viewed and evaluated these images (plain radiographs) as part of my medical decision making, as well as reviewing the written report by the radiologist.  ED MD interpretation: No bony abnormalities on plain films.  No acute head injury on CT head.  Cervical spine CT demonstrates a T3 fracture of unclear chronology.  Official radiology report(s): Dg Knee 2 Views Left  Result Date: 09/24/2018 CLINICAL DATA:  Unwitnessed fall. Left knee pain. EXAM: LEFT KNEE - 1-2 VIEW COMPARISON:  05/16/2014 FINDINGS: Degenerative changes in the left knee with lateral greater than medial compartment narrowing and associated hypertrophic changes. Cartilaginous calcification. Old osteochondral defect in the medial and lateral femoral condyles. No significant change since previous study. No evidence of acute fracture or dislocation. No significant effusion. Soft tissues are unremarkable. IMPRESSION: Degenerative changes in the left knee. No acute bony abnormalities. Electronically Signed   By: Lucienne Capers M.D.   On: 09/24/2018 23:40   Ct Head Wo Contrast  Result Date: 09/24/2018 CLINICAL DATA:  Unwitnessed fall at care facility. Struck back of head on carpeted floor. No loss of consciousness. History of Parkinson's disease, hypertension and hyperlipidemia.  EXAM: CT HEAD WITHOUT CONTRAST CT CERVICAL SPINE WITHOUT CONTRAST TECHNIQUE: Multidetector CT imaging of the head and cervical spine was performed following the standard protocol without intravenous contrast. Multiplanar CT image reconstructions of the cervical spine were also generated. COMPARISON:  CT HEAD January 22, 2016. thoracic spine radiograph Jan 02, 2017 FINDINGS: CT HEAD FINDINGS BRAIN: No intraparenchymal hemorrhage, mass effect nor midline shift. No parenchymal brain volume loss for age. No hydrocephalus. Old RIGHT and possibly LEFT basal ganglia lacunar infarcts. Patchy supratentorial white matter  hypodensities within normal range for patient's age, though non-specific are most compatible with chronic small vessel ischemic disease. No acute large vascular territory infarcts. No abnormal extra-axial fluid collections. Basal cisterns are patent. VASCULAR: Moderate calcific atherosclerosis of the carotid siphons. SKULL: No skull fracture. Osteopenia. Moderate LEFT parietal vertex scalp hematoma without subcutaneous gas or radiopaque foreign bodies. SINUSES/ORBITS: Paranasal sinuses are well aerated. Mastoid air cells are well aerated.The included ocular globes and orbital contents are non-suspicious. OTHER: None. CT CERVICAL SPINE FINDINGS ALIGNMENT: Maintained lordosis. No malalignment. Cervicothoracic levoscoliosis. SKULL BASE AND VERTEBRAE: Cervical vertebral bodies and posterior elements are intact. Moderate T3 compression fracture with further height loss from prior radiograph. Severe C4-5 disc height loss and endplate spurring compatible with degenerative discs. Severe LEFT C3-4 facet arthropathy. No destructive bony lesions. C1-2 articulation maintained. SOFT TISSUES AND SPINAL CANAL: Nonacute. Moderate calcific atherosclerosis carotid bifurcations. LEFT pacemaker wires. DISC LEVELS: No high-grade osseous canal stenosis. Moderate C3-4, LEFT C4-5 neural foraminal narrowing. UPPER CHEST: Lung apices are  clear. OTHER: None. IMPRESSION: CT HEAD: 1. No acute intracranial process.  LEFT posterior scalp hematoma. 2. Stable examination including moderate chronic small vessel ischemic changes and old basal ganglia lacunar infarcts. CT CERVICAL SPINE: 1. Moderate T3 compression fracture, possibly acute. Recommend correlation with point tenderness. 2. No cervical spine fracture or malalignment. Electronically Signed   By: Elon Alas M.D.   On: 09/24/2018 23:53   Ct Cervical Spine Wo Contrast  Result Date: 09/24/2018 CLINICAL DATA:  Unwitnessed fall at care facility. Struck back of head on carpeted floor. No loss of consciousness. History of Parkinson's disease, hypertension and hyperlipidemia. EXAM: CT HEAD WITHOUT CONTRAST CT CERVICAL SPINE WITHOUT CONTRAST TECHNIQUE: Multidetector CT imaging of the head and cervical spine was performed following the standard protocol without intravenous contrast. Multiplanar CT image reconstructions of the cervical spine were also generated. COMPARISON:  CT HEAD January 22, 2016. thoracic spine radiograph Jan 02, 2017 FINDINGS: CT HEAD FINDINGS BRAIN: No intraparenchymal hemorrhage, mass effect nor midline shift. No parenchymal brain volume loss for age. No hydrocephalus. Old RIGHT and possibly LEFT basal ganglia lacunar infarcts. Patchy supratentorial white matter hypodensities within normal range for patient's age, though non-specific are most compatible with chronic small vessel ischemic disease. No acute large vascular territory infarcts. No abnormal extra-axial fluid collections. Basal cisterns are patent. VASCULAR: Moderate calcific atherosclerosis of the carotid siphons. SKULL: No skull fracture. Osteopenia. Moderate LEFT parietal vertex scalp hematoma without subcutaneous gas or radiopaque foreign bodies. SINUSES/ORBITS: Paranasal sinuses are well aerated. Mastoid air cells are well aerated.The included ocular globes and orbital contents are non-suspicious. OTHER: None. CT  CERVICAL SPINE FINDINGS ALIGNMENT: Maintained lordosis. No malalignment. Cervicothoracic levoscoliosis. SKULL BASE AND VERTEBRAE: Cervical vertebral bodies and posterior elements are intact. Moderate T3 compression fracture with further height loss from prior radiograph. Severe C4-5 disc height loss and endplate spurring compatible with degenerative discs. Severe LEFT C3-4 facet arthropathy. No destructive bony lesions. C1-2 articulation maintained. SOFT TISSUES AND SPINAL CANAL: Nonacute. Moderate calcific atherosclerosis carotid bifurcations. LEFT pacemaker wires. DISC LEVELS: No high-grade osseous canal stenosis. Moderate C3-4, LEFT C4-5 neural foraminal narrowing. UPPER CHEST: Lung apices are clear. OTHER: None. IMPRESSION: CT HEAD: 1. No acute intracranial process.  LEFT posterior scalp hematoma. 2. Stable examination including moderate chronic small vessel ischemic changes and old basal ganglia lacunar infarcts. CT CERVICAL SPINE: 1. Moderate T3 compression fracture, possibly acute. Recommend correlation with point tenderness. 2. No cervical spine fracture or malalignment. Electronically Signed   By: Sandie Ano  Bloomer M.D.   On: 09/24/2018 23:53   Dg Hip Unilat W Or Wo Pelvis 2-3 Views Right  Result Date: 09/24/2018 CLINICAL DATA:  Unwitnessed fall. Left knee and right hip pain. EXAM: DG HIP (WITH OR WITHOUT PELVIS) 2-3V RIGHT COMPARISON:  11/24/2015 FINDINGS: Mild degenerative changes in the right hip. No evidence of acute fracture or dislocation. Old fracture deformity of the left inferior pubic ramus. Previous internal fixation of the left hip. Degenerative changes in the lower lumbar spine and left hip. SI joints and symphysis pubis are not displaced. IMPRESSION: No acute bony abnormalities. Electronically Signed   By: Lucienne Capers M.D.   On: 09/24/2018 23:39    ____________________________________________   PROCEDURES  Critical Care performed: No   Procedure(s) performed:    Procedures   ____________________________________________   INITIAL IMPRESSION / ASSESSMENT AND PLAN / ED COURSE  As part of my medical decision making, I reviewed the following data within the [place] History obtained from family, Nursing notes reviewed and incorporated, Old chart reviewed, Radiograph reviewed  and Discussed with orthopedist (Dr. Rudene Christians).    Differential diagnosis includes, but is not limited to, fracture or dislocation, contusion, muscle strain.  The patient is in no distress.  She and the family are concerned about the possibility she may have a new fracture because of her osteoporosis and multiple fractures she has had in the past.  She is not having any neck or back pain.  She has some pain in the back of her head, her right hip, and her left knee.  However the family points out that the left knee pain has been present for a while and may have contributed to her fall.  I will obtain radiographs of the right hip and the left knee and given that she is on Xarelto I will check a CT of her head and cervical spine just to be safe.  If no acute abnormalities are found and she is able to bear weight, I think she would be appropriate for discharge and outpatient follow-up.  The family agrees with the current plan and including no additional work-up at this time other than the imaging.  Clinical Course as of Sep 25 224  Tue Sep 25, 2018  0046 The patient has a T3 compression fracture of unclear chronicity.  I checked with the patient's family and they were unaware of this particular injury, but the patient has no point tenderness and is not having any back pain in this area.  I discussed the case by phone with Dr. Rudene Christians with orthopedic surgery and he did not recommend any intervention at this time.Given the patient's age and comorbidities, the family is quite concerned that she will not be able to bear weight or walk even with assistance.  ED staff is assisting her  with a trial of ambulation.  If she is unable to do so, I will place PT/OT consults and social work consult as she may require a higher level of care.  The patient and family understand the plan.   [CF]  0224 The patient was able to ambulate with relatively minimal assistance.  The family is comfortable with her going back to [facility] if I will indicate on her discharge paperwork that she requires Level 3 care instead of the care she is currently receiving, which will provide her additional assistance in her room.  I think this is very appropriate given her pain, age, and comorbidities.  I have indicated on  her paperwork that she should have level 3 care and that she should follow-up with her PCP as well as with orthopedics.  She has seen Dr. Sabra Heck in the past so I provided the appropriate contact information.  I gave my usual and customary return precautions.   [CF]    Clinical Course User Index [CF] Hinda Kehr, MD    ____________________________________________  FINAL CLINICAL IMPRESSION(S) / ED DIAGNOSES  Final diagnoses:  Fall, initial encounter  Left knee pain, unspecified chronicity  Minor head injury, initial encounter  Parkinson's disease ([place])     MEDICATIONS GIVEN DURING THIS VISIT:  Medications - No data to display   ED Discharge Orders    None       Note:  This document was prepared using Dragon voice recognition software and may include unintentional dictation errors.   Hinda Kehr, MD 09/25/18 [phone]

## 2018-09-26 DIAGNOSIS — M810 Age-related osteoporosis without current pathological fracture: Secondary | ICD-10-CM | POA: Diagnosis not present

## 2018-09-26 DIAGNOSIS — I4821 Permanent atrial fibrillation: Secondary | ICD-10-CM | POA: Diagnosis not present

## 2018-09-26 DIAGNOSIS — E785 Hyperlipidemia, unspecified: Secondary | ICD-10-CM | POA: Diagnosis not present

## 2018-09-26 DIAGNOSIS — I10 Essential (primary) hypertension: Secondary | ICD-10-CM | POA: Diagnosis not present

## 2018-09-26 DIAGNOSIS — G20 Parkinson's disease: Secondary | ICD-10-CM | POA: Diagnosis not present

## 2018-09-26 DIAGNOSIS — F0280 Dementia in other diseases classified elsewhere without behavioral disturbance: Secondary | ICD-10-CM | POA: Diagnosis not present

## 2018-09-27 DIAGNOSIS — E785 Hyperlipidemia, unspecified: Secondary | ICD-10-CM | POA: Diagnosis not present

## 2018-09-27 DIAGNOSIS — Z950 Presence of cardiac pacemaker: Secondary | ICD-10-CM | POA: Diagnosis not present

## 2018-09-27 DIAGNOSIS — S8992XD Unspecified injury of left lower leg, subsequent encounter: Secondary | ICD-10-CM | POA: Diagnosis not present

## 2018-09-27 DIAGNOSIS — F0280 Dementia in other diseases classified elsewhere without behavioral disturbance: Secondary | ICD-10-CM | POA: Diagnosis not present

## 2018-09-27 DIAGNOSIS — M810 Age-related osteoporosis without current pathological fracture: Secondary | ICD-10-CM | POA: Diagnosis not present

## 2018-09-27 DIAGNOSIS — I4821 Permanent atrial fibrillation: Secondary | ICD-10-CM | POA: Diagnosis not present

## 2018-09-27 DIAGNOSIS — G20 Parkinson's disease: Secondary | ICD-10-CM | POA: Diagnosis not present

## 2018-09-27 DIAGNOSIS — S22030A Wedge compression fracture of third thoracic vertebra, initial encounter for closed fracture: Secondary | ICD-10-CM | POA: Diagnosis not present

## 2018-09-27 DIAGNOSIS — R269 Unspecified abnormalities of gait and mobility: Secondary | ICD-10-CM | POA: Diagnosis not present

## 2018-09-27 DIAGNOSIS — I10 Essential (primary) hypertension: Secondary | ICD-10-CM | POA: Diagnosis not present

## 2018-10-01 DIAGNOSIS — E785 Hyperlipidemia, unspecified: Secondary | ICD-10-CM | POA: Diagnosis not present

## 2018-10-01 DIAGNOSIS — I4821 Permanent atrial fibrillation: Secondary | ICD-10-CM | POA: Diagnosis not present

## 2018-10-01 DIAGNOSIS — G20 Parkinson's disease: Secondary | ICD-10-CM | POA: Diagnosis not present

## 2018-10-01 DIAGNOSIS — M810 Age-related osteoporosis without current pathological fracture: Secondary | ICD-10-CM | POA: Diagnosis not present

## 2018-10-01 DIAGNOSIS — I10 Essential (primary) hypertension: Secondary | ICD-10-CM | POA: Diagnosis not present

## 2018-10-01 DIAGNOSIS — F0280 Dementia in other diseases classified elsewhere without behavioral disturbance: Secondary | ICD-10-CM | POA: Diagnosis not present

## 2018-10-03 DIAGNOSIS — I4821 Permanent atrial fibrillation: Secondary | ICD-10-CM | POA: Diagnosis not present

## 2018-10-03 DIAGNOSIS — M810 Age-related osteoporosis without current pathological fracture: Secondary | ICD-10-CM | POA: Diagnosis not present

## 2018-10-03 DIAGNOSIS — I10 Essential (primary) hypertension: Secondary | ICD-10-CM | POA: Diagnosis not present

## 2018-10-03 DIAGNOSIS — F0280 Dementia in other diseases classified elsewhere without behavioral disturbance: Secondary | ICD-10-CM | POA: Diagnosis not present

## 2018-10-03 DIAGNOSIS — E785 Hyperlipidemia, unspecified: Secondary | ICD-10-CM | POA: Diagnosis not present

## 2018-10-03 DIAGNOSIS — G20 Parkinson's disease: Secondary | ICD-10-CM | POA: Diagnosis not present

## 2018-10-04 DIAGNOSIS — I4821 Permanent atrial fibrillation: Secondary | ICD-10-CM | POA: Diagnosis not present

## 2018-10-04 DIAGNOSIS — I10 Essential (primary) hypertension: Secondary | ICD-10-CM | POA: Diagnosis not present

## 2018-10-04 DIAGNOSIS — G20 Parkinson's disease: Secondary | ICD-10-CM | POA: Diagnosis not present

## 2018-10-04 DIAGNOSIS — F0280 Dementia in other diseases classified elsewhere without behavioral disturbance: Secondary | ICD-10-CM | POA: Diagnosis not present

## 2018-10-04 DIAGNOSIS — M810 Age-related osteoporosis without current pathological fracture: Secondary | ICD-10-CM | POA: Diagnosis not present

## 2018-10-04 DIAGNOSIS — E785 Hyperlipidemia, unspecified: Secondary | ICD-10-CM | POA: Diagnosis not present

## 2018-10-08 DIAGNOSIS — I10 Essential (primary) hypertension: Secondary | ICD-10-CM | POA: Diagnosis not present

## 2018-10-08 DIAGNOSIS — G20 Parkinson's disease: Secondary | ICD-10-CM | POA: Diagnosis not present

## 2018-10-08 DIAGNOSIS — I4821 Permanent atrial fibrillation: Secondary | ICD-10-CM | POA: Diagnosis not present

## 2018-10-08 DIAGNOSIS — M810 Age-related osteoporosis without current pathological fracture: Secondary | ICD-10-CM | POA: Diagnosis not present

## 2018-10-08 DIAGNOSIS — F0280 Dementia in other diseases classified elsewhere without behavioral disturbance: Secondary | ICD-10-CM | POA: Diagnosis not present

## 2018-10-08 DIAGNOSIS — E785 Hyperlipidemia, unspecified: Secondary | ICD-10-CM | POA: Diagnosis not present

## 2018-10-09 DIAGNOSIS — M810 Age-related osteoporosis without current pathological fracture: Secondary | ICD-10-CM | POA: Diagnosis not present

## 2018-10-09 DIAGNOSIS — I10 Essential (primary) hypertension: Secondary | ICD-10-CM | POA: Diagnosis not present

## 2018-10-09 DIAGNOSIS — F0280 Dementia in other diseases classified elsewhere without behavioral disturbance: Secondary | ICD-10-CM | POA: Diagnosis not present

## 2018-10-09 DIAGNOSIS — E785 Hyperlipidemia, unspecified: Secondary | ICD-10-CM | POA: Diagnosis not present

## 2018-10-09 DIAGNOSIS — G20 Parkinson's disease: Secondary | ICD-10-CM | POA: Diagnosis not present

## 2018-10-09 DIAGNOSIS — I4821 Permanent atrial fibrillation: Secondary | ICD-10-CM | POA: Diagnosis not present

## 2018-10-11 DIAGNOSIS — I10 Essential (primary) hypertension: Secondary | ICD-10-CM | POA: Diagnosis not present

## 2018-10-11 DIAGNOSIS — E785 Hyperlipidemia, unspecified: Secondary | ICD-10-CM | POA: Diagnosis not present

## 2018-10-11 DIAGNOSIS — G20 Parkinson's disease: Secondary | ICD-10-CM | POA: Diagnosis not present

## 2018-10-11 DIAGNOSIS — M810 Age-related osteoporosis without current pathological fracture: Secondary | ICD-10-CM | POA: Diagnosis not present

## 2018-10-11 DIAGNOSIS — I4821 Permanent atrial fibrillation: Secondary | ICD-10-CM | POA: Diagnosis not present

## 2018-10-11 DIAGNOSIS — F0280 Dementia in other diseases classified elsewhere without behavioral disturbance: Secondary | ICD-10-CM | POA: Diagnosis not present

## 2018-10-13 DIAGNOSIS — G20 Parkinson's disease: Secondary | ICD-10-CM | POA: Diagnosis not present

## 2018-10-13 DIAGNOSIS — Z6830 Body mass index (BMI) 30.0-30.9, adult: Secondary | ICD-10-CM | POA: Diagnosis not present

## 2018-10-13 DIAGNOSIS — E785 Hyperlipidemia, unspecified: Secondary | ICD-10-CM | POA: Diagnosis not present

## 2018-10-13 DIAGNOSIS — I10 Essential (primary) hypertension: Secondary | ICD-10-CM | POA: Diagnosis not present

## 2018-10-13 DIAGNOSIS — I4821 Permanent atrial fibrillation: Secondary | ICD-10-CM | POA: Diagnosis not present

## 2018-10-13 DIAGNOSIS — M810 Age-related osteoporosis without current pathological fracture: Secondary | ICD-10-CM | POA: Diagnosis not present

## 2018-10-13 DIAGNOSIS — E559 Vitamin D deficiency, unspecified: Secondary | ICD-10-CM | POA: Diagnosis not present

## 2018-10-13 DIAGNOSIS — F0280 Dementia in other diseases classified elsewhere without behavioral disturbance: Secondary | ICD-10-CM | POA: Diagnosis not present

## 2018-10-15 DIAGNOSIS — E785 Hyperlipidemia, unspecified: Secondary | ICD-10-CM | POA: Diagnosis not present

## 2018-10-15 DIAGNOSIS — F0280 Dementia in other diseases classified elsewhere without behavioral disturbance: Secondary | ICD-10-CM | POA: Diagnosis not present

## 2018-10-15 DIAGNOSIS — I4821 Permanent atrial fibrillation: Secondary | ICD-10-CM | POA: Diagnosis not present

## 2018-10-15 DIAGNOSIS — G20 Parkinson's disease: Secondary | ICD-10-CM | POA: Diagnosis not present

## 2018-10-15 DIAGNOSIS — M810 Age-related osteoporosis without current pathological fracture: Secondary | ICD-10-CM | POA: Diagnosis not present

## 2018-10-15 DIAGNOSIS — I10 Essential (primary) hypertension: Secondary | ICD-10-CM | POA: Diagnosis not present

## 2018-10-18 DIAGNOSIS — F0280 Dementia in other diseases classified elsewhere without behavioral disturbance: Secondary | ICD-10-CM | POA: Diagnosis not present

## 2018-10-18 DIAGNOSIS — E785 Hyperlipidemia, unspecified: Secondary | ICD-10-CM | POA: Diagnosis not present

## 2018-10-18 DIAGNOSIS — I10 Essential (primary) hypertension: Secondary | ICD-10-CM | POA: Diagnosis not present

## 2018-10-18 DIAGNOSIS — G20 Parkinson's disease: Secondary | ICD-10-CM | POA: Diagnosis not present

## 2018-10-18 DIAGNOSIS — I4821 Permanent atrial fibrillation: Secondary | ICD-10-CM | POA: Diagnosis not present

## 2018-10-18 DIAGNOSIS — M810 Age-related osteoporosis without current pathological fracture: Secondary | ICD-10-CM | POA: Diagnosis not present

## 2018-10-19 DIAGNOSIS — I4821 Permanent atrial fibrillation: Secondary | ICD-10-CM | POA: Diagnosis not present

## 2018-10-19 DIAGNOSIS — F0280 Dementia in other diseases classified elsewhere without behavioral disturbance: Secondary | ICD-10-CM | POA: Diagnosis not present

## 2018-10-19 DIAGNOSIS — G20 Parkinson's disease: Secondary | ICD-10-CM | POA: Diagnosis not present

## 2018-10-19 DIAGNOSIS — E785 Hyperlipidemia, unspecified: Secondary | ICD-10-CM | POA: Diagnosis not present

## 2018-10-19 DIAGNOSIS — M810 Age-related osteoporosis without current pathological fracture: Secondary | ICD-10-CM | POA: Diagnosis not present

## 2018-10-19 DIAGNOSIS — I10 Essential (primary) hypertension: Secondary | ICD-10-CM | POA: Diagnosis not present

## 2018-10-22 DIAGNOSIS — I4821 Permanent atrial fibrillation: Secondary | ICD-10-CM | POA: Diagnosis not present

## 2018-10-22 DIAGNOSIS — G20 Parkinson's disease: Secondary | ICD-10-CM | POA: Diagnosis not present

## 2018-10-22 DIAGNOSIS — F0280 Dementia in other diseases classified elsewhere without behavioral disturbance: Secondary | ICD-10-CM | POA: Diagnosis not present

## 2018-10-22 DIAGNOSIS — M810 Age-related osteoporosis without current pathological fracture: Secondary | ICD-10-CM | POA: Diagnosis not present

## 2018-10-22 DIAGNOSIS — I10 Essential (primary) hypertension: Secondary | ICD-10-CM | POA: Diagnosis not present

## 2018-10-22 DIAGNOSIS — E785 Hyperlipidemia, unspecified: Secondary | ICD-10-CM | POA: Diagnosis not present

## 2018-10-23 DIAGNOSIS — G20 Parkinson's disease: Secondary | ICD-10-CM | POA: Diagnosis not present

## 2018-10-23 DIAGNOSIS — E785 Hyperlipidemia, unspecified: Secondary | ICD-10-CM | POA: Diagnosis not present

## 2018-10-23 DIAGNOSIS — I10 Essential (primary) hypertension: Secondary | ICD-10-CM | POA: Diagnosis not present

## 2018-10-23 DIAGNOSIS — M810 Age-related osteoporosis without current pathological fracture: Secondary | ICD-10-CM | POA: Diagnosis not present

## 2018-10-23 DIAGNOSIS — F0280 Dementia in other diseases classified elsewhere without behavioral disturbance: Secondary | ICD-10-CM | POA: Diagnosis not present

## 2018-10-23 DIAGNOSIS — I4821 Permanent atrial fibrillation: Secondary | ICD-10-CM | POA: Diagnosis not present

## 2018-10-24 DIAGNOSIS — G20 Parkinson's disease: Secondary | ICD-10-CM | POA: Diagnosis not present

## 2018-10-24 DIAGNOSIS — I4821 Permanent atrial fibrillation: Secondary | ICD-10-CM | POA: Diagnosis not present

## 2018-10-24 DIAGNOSIS — M810 Age-related osteoporosis without current pathological fracture: Secondary | ICD-10-CM | POA: Diagnosis not present

## 2018-10-24 DIAGNOSIS — I10 Essential (primary) hypertension: Secondary | ICD-10-CM | POA: Diagnosis not present

## 2018-10-24 DIAGNOSIS — F0280 Dementia in other diseases classified elsewhere without behavioral disturbance: Secondary | ICD-10-CM | POA: Diagnosis not present

## 2018-10-24 DIAGNOSIS — E785 Hyperlipidemia, unspecified: Secondary | ICD-10-CM | POA: Diagnosis not present

## 2018-10-29 DIAGNOSIS — E785 Hyperlipidemia, unspecified: Secondary | ICD-10-CM | POA: Diagnosis not present

## 2018-10-29 DIAGNOSIS — R269 Unspecified abnormalities of gait and mobility: Secondary | ICD-10-CM | POA: Diagnosis not present

## 2018-10-29 DIAGNOSIS — S8992XD Unspecified injury of left lower leg, subsequent encounter: Secondary | ICD-10-CM | POA: Diagnosis not present

## 2018-10-29 DIAGNOSIS — I4891 Unspecified atrial fibrillation: Secondary | ICD-10-CM | POA: Diagnosis not present

## 2018-10-29 DIAGNOSIS — I10 Essential (primary) hypertension: Secondary | ICD-10-CM | POA: Diagnosis not present

## 2018-10-29 DIAGNOSIS — Z950 Presence of cardiac pacemaker: Secondary | ICD-10-CM | POA: Diagnosis not present

## 2018-10-29 DIAGNOSIS — S22030A Wedge compression fracture of third thoracic vertebra, initial encounter for closed fracture: Secondary | ICD-10-CM | POA: Diagnosis not present

## 2018-10-29 DIAGNOSIS — G20 Parkinson's disease: Secondary | ICD-10-CM | POA: Diagnosis not present

## 2018-10-30 DIAGNOSIS — I10 Essential (primary) hypertension: Secondary | ICD-10-CM | POA: Diagnosis not present

## 2018-10-30 DIAGNOSIS — I4821 Permanent atrial fibrillation: Secondary | ICD-10-CM | POA: Diagnosis not present

## 2018-10-30 DIAGNOSIS — F0280 Dementia in other diseases classified elsewhere without behavioral disturbance: Secondary | ICD-10-CM | POA: Diagnosis not present

## 2018-10-30 DIAGNOSIS — G20 Parkinson's disease: Secondary | ICD-10-CM | POA: Diagnosis not present

## 2018-10-30 DIAGNOSIS — M810 Age-related osteoporosis without current pathological fracture: Secondary | ICD-10-CM | POA: Diagnosis not present

## 2018-10-30 DIAGNOSIS — E785 Hyperlipidemia, unspecified: Secondary | ICD-10-CM | POA: Diagnosis not present

## 2018-10-31 DIAGNOSIS — F5102 Adjustment insomnia: Secondary | ICD-10-CM | POA: Diagnosis not present

## 2018-10-31 DIAGNOSIS — R443 Hallucinations, unspecified: Secondary | ICD-10-CM | POA: Diagnosis not present

## 2018-10-31 DIAGNOSIS — F419 Anxiety disorder, unspecified: Secondary | ICD-10-CM | POA: Diagnosis not present

## 2018-10-31 DIAGNOSIS — G20 Parkinson's disease: Secondary | ICD-10-CM | POA: Diagnosis not present

## 2018-11-01 DIAGNOSIS — G20 Parkinson's disease: Secondary | ICD-10-CM | POA: Diagnosis not present

## 2018-11-01 DIAGNOSIS — I4821 Permanent atrial fibrillation: Secondary | ICD-10-CM | POA: Diagnosis not present

## 2018-11-01 DIAGNOSIS — I10 Essential (primary) hypertension: Secondary | ICD-10-CM | POA: Diagnosis not present

## 2018-11-01 DIAGNOSIS — F0280 Dementia in other diseases classified elsewhere without behavioral disturbance: Secondary | ICD-10-CM | POA: Diagnosis not present

## 2018-11-01 DIAGNOSIS — E785 Hyperlipidemia, unspecified: Secondary | ICD-10-CM | POA: Diagnosis not present

## 2018-11-01 DIAGNOSIS — M810 Age-related osteoporosis without current pathological fracture: Secondary | ICD-10-CM | POA: Diagnosis not present

## 2018-11-06 DIAGNOSIS — F0280 Dementia in other diseases classified elsewhere without behavioral disturbance: Secondary | ICD-10-CM | POA: Diagnosis not present

## 2018-11-06 DIAGNOSIS — I10 Essential (primary) hypertension: Secondary | ICD-10-CM | POA: Diagnosis not present

## 2018-11-06 DIAGNOSIS — G20 Parkinson's disease: Secondary | ICD-10-CM | POA: Diagnosis not present

## 2018-11-06 DIAGNOSIS — I4821 Permanent atrial fibrillation: Secondary | ICD-10-CM | POA: Diagnosis not present

## 2018-11-06 DIAGNOSIS — E785 Hyperlipidemia, unspecified: Secondary | ICD-10-CM | POA: Diagnosis not present

## 2018-11-06 DIAGNOSIS — M810 Age-related osteoporosis without current pathological fracture: Secondary | ICD-10-CM | POA: Diagnosis not present

## 2018-11-07 DIAGNOSIS — G20 Parkinson's disease: Secondary | ICD-10-CM | POA: Diagnosis not present

## 2018-11-07 DIAGNOSIS — I10 Essential (primary) hypertension: Secondary | ICD-10-CM | POA: Diagnosis not present

## 2018-11-07 DIAGNOSIS — F0280 Dementia in other diseases classified elsewhere without behavioral disturbance: Secondary | ICD-10-CM | POA: Diagnosis not present

## 2018-11-07 DIAGNOSIS — M810 Age-related osteoporosis without current pathological fracture: Secondary | ICD-10-CM | POA: Diagnosis not present

## 2018-11-07 DIAGNOSIS — I4821 Permanent atrial fibrillation: Secondary | ICD-10-CM | POA: Diagnosis not present

## 2018-11-07 DIAGNOSIS — E785 Hyperlipidemia, unspecified: Secondary | ICD-10-CM | POA: Diagnosis not present

## 2018-11-08 ENCOUNTER — Telehealth: Payer: Self-pay

## 2018-11-08 NOTE — Telephone Encounter (Signed)
Pt's daughter got a call from "our office" telling her she needs to have a flu vaccine   [facility] uses [facility].  Not our office... She did get a flu vaccine.  She is [facility] now...  [place]

## 2018-11-21 ENCOUNTER — Telehealth: Payer: Self-pay

## 2018-11-21 NOTE — Telephone Encounter (Signed)
Received call from daughter, Whitney Hoffman, [place].  She asked if the remote transmission can be scheduled at a later date.  Patient lives in an assisted living facility which is on lock down so she is unable to get the monitor to her at this time.  Advised will change remote transmission to 12/24/2018 and if patient has any problems she can call back.

## 2018-11-22 DIAGNOSIS — I739 Peripheral vascular disease, unspecified: Secondary | ICD-10-CM | POA: Diagnosis not present

## 2018-11-22 DIAGNOSIS — B351 Tinea unguium: Secondary | ICD-10-CM | POA: Diagnosis not present

## 2018-11-22 DIAGNOSIS — R600 Localized edema: Secondary | ICD-10-CM | POA: Diagnosis not present

## 2018-11-26 ENCOUNTER — Telehealth: Payer: Self-pay | Admitting: Internal Medicine

## 2018-11-26 NOTE — Telephone Encounter (Signed)
Patient daughter calling States Whitney Hoffman has requested that the change of the appointment and the doctors orders are faxed to the facility Fax is [phone] Please advise

## 2018-11-27 DIAGNOSIS — G20 Parkinson's disease: Secondary | ICD-10-CM | POA: Diagnosis not present

## 2018-11-27 DIAGNOSIS — R2681 Unsteadiness on feet: Secondary | ICD-10-CM | POA: Diagnosis not present

## 2018-11-27 DIAGNOSIS — M6281 Muscle weakness (generalized): Secondary | ICD-10-CM | POA: Diagnosis not present

## 2018-11-27 NOTE — Telephone Encounter (Signed)
Spoke with patient's daughter.  She says [facility] needs an order saying ok to cancel this remote transmission that was originally scheduled for 11/22/18. Daughter has the device to transmit and usually goes to her mother at the nursing to do so. In light of Covid19, she is not permitted in the facility. She does not want to leave the box there in fear it will be lost but will do so if advised by [place].  Ultimately, she needs an order faxed to [facility] [phone] fax) saying patient did not need past download from 4/2. Patient is also scheduled the beginning of May. Let her know someone may call her to arrange a Virtual visit at some point.   She is aware I will route to device clinic to details on management of the device and the order to be faxed.

## 2018-11-27 NOTE — Telephone Encounter (Signed)
Transmission was rescheduled to 12/24/18. Will not be able to fax an order until Monday, 12/03/18, at the earliest.  Spoke with Clarene Critchley, [place], at [facility]. She is aware order will not be faxed until Monday. Plan to reschedule transmission to 01/15/19. Hopefully facility lock down will be over at that point. She requests order be faxed to [phone] (AttnClarene Critchley, RN).  Spoke with pt's daughter. She is aware of updated remote transmission date and denies additional questions or concerns at this time.

## 2018-11-28 ENCOUNTER — Other Ambulatory Visit: Payer: Self-pay

## 2018-11-28 ENCOUNTER — Ambulatory Visit (INDEPENDENT_AMBULATORY_CARE_PROVIDER_SITE_OTHER): Payer: Medicare Other | Admitting: *Deleted

## 2018-11-28 DIAGNOSIS — I495 Sick sinus syndrome: Secondary | ICD-10-CM

## 2018-11-28 LAB — CUP PACEART REMOTE DEVICE CHECK
Battery Impedance: 1451 Ohm
Battery Remaining Longevity: 49 mo
Battery Voltage: 2.77 V
Brady Statistic RV Percent Paced: 28 %
Date Time Interrogation Session: 20200408142324
Implantable Lead Implant Date: 20111108
Implantable Lead Implant Date: 20111108
Implantable Lead Location: 753859
Implantable Lead Location: 753860
Implantable Lead Model: 5076
Implantable Lead Model: 5076
Implantable Pulse Generator Implant Date: 20111108
Lead Channel Impedance Value: 540 Ohm
Lead Channel Impedance Value: 67 Ohm
Lead Channel Pacing Threshold Amplitude: 0.75 V
Lead Channel Pacing Threshold Pulse Width: 0.4 ms
Lead Channel Setting Pacing Amplitude: 2.5 V
Lead Channel Setting Pacing Pulse Width: 0.4 ms
Lead Channel Setting Sensing Sensitivity: 4 mV

## 2018-11-29 DIAGNOSIS — R2681 Unsteadiness on feet: Secondary | ICD-10-CM | POA: Diagnosis not present

## 2018-11-29 DIAGNOSIS — M6281 Muscle weakness (generalized): Secondary | ICD-10-CM | POA: Diagnosis not present

## 2018-11-29 DIAGNOSIS — G20 Parkinson's disease: Secondary | ICD-10-CM | POA: Diagnosis not present

## 2018-11-29 NOTE — Telephone Encounter (Signed)
Remote transmission was received on 11/28/18.
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
## 2018-11-30 DIAGNOSIS — G20 Parkinson's disease: Secondary | ICD-10-CM | POA: Diagnosis not present

## 2018-11-30 DIAGNOSIS — M6281 Muscle weakness (generalized): Secondary | ICD-10-CM | POA: Diagnosis not present

## 2018-11-30 DIAGNOSIS — R2681 Unsteadiness on feet: Secondary | ICD-10-CM | POA: Diagnosis not present

## 2018-12-03 ENCOUNTER — Encounter: Payer: Self-pay | Admitting: *Deleted

## 2018-12-03 DIAGNOSIS — Z950 Presence of cardiac pacemaker: Secondary | ICD-10-CM | POA: Diagnosis not present

## 2018-12-03 DIAGNOSIS — S22030A Wedge compression fracture of third thoracic vertebra, initial encounter for closed fracture: Secondary | ICD-10-CM | POA: Diagnosis not present

## 2018-12-03 DIAGNOSIS — S8992XD Unspecified injury of left lower leg, subsequent encounter: Secondary | ICD-10-CM | POA: Diagnosis not present

## 2018-12-03 DIAGNOSIS — G20 Parkinson's disease: Secondary | ICD-10-CM | POA: Diagnosis not present

## 2018-12-03 DIAGNOSIS — I4891 Unspecified atrial fibrillation: Secondary | ICD-10-CM | POA: Diagnosis not present

## 2018-12-03 DIAGNOSIS — I10 Essential (primary) hypertension: Secondary | ICD-10-CM | POA: Diagnosis not present

## 2018-12-03 DIAGNOSIS — E785 Hyperlipidemia, unspecified: Secondary | ICD-10-CM | POA: Diagnosis not present

## 2018-12-03 DIAGNOSIS — R269 Unspecified abnormalities of gait and mobility: Secondary | ICD-10-CM | POA: Diagnosis not present

## 2018-12-03 NOTE — Telephone Encounter (Signed)
Faxed letter with next transmission date to [facility], attn: Clarene Critchley, RN. Confirmation received.  Attempted to reach [place], but phone rang continuously with no answer at facility.

## 2018-12-04 DIAGNOSIS — R2681 Unsteadiness on feet: Secondary | ICD-10-CM | POA: Diagnosis not present

## 2018-12-04 DIAGNOSIS — M6281 Muscle weakness (generalized): Secondary | ICD-10-CM | POA: Diagnosis not present

## 2018-12-04 DIAGNOSIS — G20 Parkinson's disease: Secondary | ICD-10-CM | POA: Diagnosis not present

## 2018-12-04 NOTE — Telephone Encounter (Signed)
LMOVM for Whitney Hoffman requesting call back to my direct number.

## 2018-12-04 NOTE — Telephone Encounter (Signed)
Attempted to reach [place], Therapist, sports, at [facility]. Held for ~27min but no answer. Will try again later.

## 2018-12-05 DIAGNOSIS — M6281 Muscle weakness (generalized): Secondary | ICD-10-CM | POA: Diagnosis not present

## 2018-12-05 DIAGNOSIS — R2681 Unsteadiness on feet: Secondary | ICD-10-CM | POA: Diagnosis not present

## 2018-12-05 DIAGNOSIS — G20 Parkinson's disease: Secondary | ICD-10-CM | POA: Diagnosis not present

## 2018-12-06 DIAGNOSIS — R2681 Unsteadiness on feet: Secondary | ICD-10-CM | POA: Diagnosis not present

## 2018-12-06 DIAGNOSIS — G20 Parkinson's disease: Secondary | ICD-10-CM | POA: Diagnosis not present

## 2018-12-06 DIAGNOSIS — M6281 Muscle weakness (generalized): Secondary | ICD-10-CM | POA: Diagnosis not present

## 2018-12-06 NOTE — Telephone Encounter (Signed)
Final attempt:  [place] to try to confirm that letter faxed 12/03/18 will suffice as order for next transmission date. Will also clarify if they need a copy of PPM report.   No answer at first extension transferred to. Called back, was transferred to supervisor's VM. LMOVM requesting call back to DC.  Gave direct number.

## 2018-12-07 NOTE — Progress Notes (Signed)
Remote pacemaker transmission.   

## 2018-12-11 DIAGNOSIS — R2681 Unsteadiness on feet: Secondary | ICD-10-CM | POA: Diagnosis not present

## 2018-12-11 DIAGNOSIS — G20 Parkinson's disease: Secondary | ICD-10-CM | POA: Diagnosis not present

## 2018-12-11 DIAGNOSIS — M6281 Muscle weakness (generalized): Secondary | ICD-10-CM | POA: Diagnosis not present

## 2018-12-12 ENCOUNTER — Telehealth: Payer: Self-pay

## 2018-12-12 DIAGNOSIS — G20 Parkinson's disease: Secondary | ICD-10-CM | POA: Diagnosis not present

## 2018-12-12 DIAGNOSIS — R2681 Unsteadiness on feet: Secondary | ICD-10-CM | POA: Diagnosis not present

## 2018-12-12 DIAGNOSIS — M6281 Muscle weakness (generalized): Secondary | ICD-10-CM | POA: Diagnosis not present

## 2018-12-12 NOTE — Telephone Encounter (Signed)
Virtual Visit Pre-Appointment Phone Call  "(Name), I am calling you today to discuss your upcoming appointment. We are currently trying to limit exposure to the virus that causes COVID-19 by seeing patients at home rather than in the office."  1. "What is the BEST phone number to call the day of the visit?" - include this in appointment notes  2. Do you have or have access to (through a family member/friend) a smartphone with video capability that we can use for your visit?" a. If yes - list this number in appt notes as cell (if different from BEST phone #) and list the appointment type as a VIDEO visit in appointment notes b. If no - list the appointment type as a PHONE visit in appointment notes  3. Confirm consent - "In the setting of the current Covid19 crisis, you are scheduled for a (phone or video) visit with your provider on (date) at (time).  Just as we do with many in-office visits, in order for you to participate in this visit, we must obtain consent.  If you'd like, I can send this to your mychart (if signed up) or email for you to review.  Otherwise, I can obtain your verbal consent now.  All virtual visits are billed to your insurance company just like a normal visit would be.  By agreeing to a virtual visit, we'd like you to understand that the technology does not allow for your provider to perform an examination, and thus may limit your provider's ability to fully assess your condition. If your provider identifies any concerns that need to be evaluated in person, we will make arrangements to do so.  Finally, though the technology is pretty good, we cannot assure that it will always work on either your or our end, and in the setting of a video visit, we may have to convert it to a phone-only visit.  In either situation, we cannot ensure that we have a secure connection.  Are you willing to proceed?"  YES     TELEPHONE CALL NOTE  Whitney Hoffman has been deemed a candidate  for a follow-up tele-health visit to limit community exposure during the Covid-19 pandemic. I spoke with the patient via phone to ensure availability of phone/video source, confirm preferred email & phone number, and discuss instructions and expectations.  I reminded Whitney Hoffman to be prepared with any vital sign and/or heart rhythm information that could potentially be obtained via home monitoring, at the time of her visit. I reminded Whitney Hoffman to expect a phone call prior to her visit.  Alba Destine, RMA 12/12/2018 4:57 PM       FULL LENGTH CONSENT FOR TELE-HEALTH VISIT   I hereby voluntarily request, consent and authorize [facility] and its employed or contracted physicians, physician assistants, nurse practitioners or other licensed health care professionals (the Practitioner), to provide me with telemedicine health care services (the Services") as deemed necessary by the treating Practitioner. I acknowledge and consent to receive the Services by the Practitioner via telemedicine. I understand that the telemedicine visit will involve communicating with the Practitioner through live audiovisual communication technology and the disclosure of certain medical information by electronic transmission. I acknowledge that I have been given the opportunity to request an in-person assessment or other available alternative prior to the telemedicine visit and am voluntarily participating in the telemedicine visit.  I understand that I have the right to withhold or withdraw my consent to the  use of telemedicine in the course of my care at any time, without affecting my right to future care or treatment, and that the Practitioner or I may terminate the telemedicine visit at any time. I understand that I have the right to inspect all information obtained and/or recorded in the course of the telemedicine visit and may receive copies of available information for a reasonable fee.  I understand  that some of the potential risks of receiving the Services via telemedicine include:   Delay or interruption in medical evaluation due to technological equipment failure or disruption;  Information transmitted may not be sufficient (e.g. poor resolution of images) to allow for appropriate medical decision making by the Practitioner; and/or   In rare instances, security protocols could fail, causing a breach of personal health information.  Furthermore, I acknowledge that it is my responsibility to provide information about my medical history, conditions and care that is complete and accurate to the best of my ability. I acknowledge that Practitioner's advice, recommendations, and/or decision may be based on factors not within their control, such as incomplete or inaccurate data provided by me or distortions of diagnostic images or specimens that may result from electronic transmissions. I understand that the practice of medicine is not an exact science and that Practitioner makes no warranties or guarantees regarding treatment outcomes. I acknowledge that I will receive a copy of this consent concurrently upon execution via email to the email address I last provided but may also request a printed copy by calling the office of [place].    I understand that my insurance will be billed for this visit.   I have read or had this consent read to me.  I understand the contents of this consent, which adequately explains the benefits and risks of the Services being provided via telemedicine.   I have been provided ample opportunity to ask questions regarding this consent and the Services and have had my questions answered to my satisfaction.  I give my informed consent for the services to be provided through the use of telemedicine in my medical care  By participating in this telemedicine visit I agree to the above.

## 2018-12-12 NOTE — Telephone Encounter (Signed)
[facility] re E-Visit for 12/25/18 with Dr. Caryl Comes. Also would like to change time to 11:00 am.

## 2018-12-13 DIAGNOSIS — R2681 Unsteadiness on feet: Secondary | ICD-10-CM | POA: Diagnosis not present

## 2018-12-13 DIAGNOSIS — G20 Parkinson's disease: Secondary | ICD-10-CM | POA: Diagnosis not present

## 2018-12-13 DIAGNOSIS — M6281 Muscle weakness (generalized): Secondary | ICD-10-CM | POA: Diagnosis not present

## 2018-12-18 DIAGNOSIS — M6281 Muscle weakness (generalized): Secondary | ICD-10-CM | POA: Diagnosis not present

## 2018-12-18 DIAGNOSIS — G20 Parkinson's disease: Secondary | ICD-10-CM | POA: Diagnosis not present

## 2018-12-18 DIAGNOSIS — R2681 Unsteadiness on feet: Secondary | ICD-10-CM | POA: Diagnosis not present

## 2018-12-19 DIAGNOSIS — M6281 Muscle weakness (generalized): Secondary | ICD-10-CM | POA: Diagnosis not present

## 2018-12-19 DIAGNOSIS — G20 Parkinson's disease: Secondary | ICD-10-CM | POA: Diagnosis not present

## 2018-12-19 DIAGNOSIS — R2681 Unsteadiness on feet: Secondary | ICD-10-CM | POA: Diagnosis not present

## 2018-12-21 DIAGNOSIS — M6281 Muscle weakness (generalized): Secondary | ICD-10-CM | POA: Diagnosis not present

## 2018-12-21 DIAGNOSIS — G20 Parkinson's disease: Secondary | ICD-10-CM | POA: Diagnosis not present

## 2018-12-21 DIAGNOSIS — R2681 Unsteadiness on feet: Secondary | ICD-10-CM | POA: Diagnosis not present

## 2018-12-24 DIAGNOSIS — Z79899 Other long term (current) drug therapy: Secondary | ICD-10-CM | POA: Diagnosis not present

## 2018-12-25 ENCOUNTER — Telehealth (INDEPENDENT_AMBULATORY_CARE_PROVIDER_SITE_OTHER): Payer: Medicare Other | Admitting: Internal Medicine

## 2018-12-25 ENCOUNTER — Other Ambulatory Visit: Payer: Self-pay

## 2018-12-25 DIAGNOSIS — M6281 Muscle weakness (generalized): Secondary | ICD-10-CM | POA: Diagnosis not present

## 2018-12-25 DIAGNOSIS — Z950 Presence of cardiac pacemaker: Secondary | ICD-10-CM

## 2018-12-25 DIAGNOSIS — R609 Edema, unspecified: Secondary | ICD-10-CM

## 2018-12-25 DIAGNOSIS — I4821 Permanent atrial fibrillation: Secondary | ICD-10-CM

## 2018-12-25 DIAGNOSIS — R2681 Unsteadiness on feet: Secondary | ICD-10-CM | POA: Diagnosis not present

## 2018-12-25 DIAGNOSIS — G20 Parkinson's disease: Secondary | ICD-10-CM | POA: Diagnosis not present

## 2018-12-25 DIAGNOSIS — I10 Essential (primary) hypertension: Secondary | ICD-10-CM

## 2018-12-25 NOTE — Progress Notes (Signed)
Electrophysiology TeleHealth Note   Due to national recommendations of social distancing due to COVID 19, an audio/video telehealth visit is felt to be most appropriate for this patient at this time.  See MyChart message from today for the patient's consent to telehealth for [facility].   Date:  12/25/2018   ID:  Whitney Hoffman, DOB May 28, 1924, MRN [id number]  Location: patient's home  Provider location: [address], [place]  Evaluation Performed: Follow-up visit  PCP:  [facility]  Cardiologist:    Electrophysiologist:  SK   Chief Complaint:  Frustrated with quarentine 2/2 virus  History of Present Illness:    Whitney Hoffman is a 85 y.o. female who presents via audio/video conferencing for a telehealth visit today.  Since last being seen in our clinic, the patient reports chronic stable dyspnea.  More edema than normal  NO PND or orthopnea, no chest pain, palpitations  No bleeding   The patient denies symptoms of fevers, chills, cough, or new SOB worrisome for COVID 19.    Past Medical History:  Diagnosis Date  . Atrial fibrillation ([place])   . Hyperlipidemia   . Hypertension   . Parkinson disease ([place])   . Tachy-brady syndrome ([place])    with medtronic dual chambe rPCM    Past Surgical History:  Procedure Laterality Date  . ABDOMINAL HYSTERECTOMY  1966   fibroids ovaries removed secondary to ovarian cyst  . APPENDECTOMY  1944  . baldder tack     with her hysterectomy  . BREAST BIOPSY  2010   left benign repeat ultrasound recommneded in 2011 [facility]    . CHOLECYSTECTOMY  1969  . GALLBLADDER SURGERY    . HIP SURGERY Left    ORIF x 2; post op infection Left hip  . PACEMAKER PLACEMENT  06/29/2010  . WRIST SURGERY  1996   Traumatic B    Current Outpatient Medications  Medication Sig Dispense Refill  . Calcium Carbonate-Vitamin D (CALCIUM 600+D HIGH POTENCY PO) Take 1 tablet 2 (two) times  daily by mouth.    . cetirizine (ZYRTEC) 10 MG tablet Take 1 tablet (10 mg total) by mouth daily as needed for allergies. 30 tablet 11  . Cholecalciferol (VITAMIN D-3) 5000 units TABS Take 1 tablet by mouth daily.    . Cyanocobalamin (VITAMIN B-12) 5000 MCG SUBL Place 1 tablet daily under the tongue.     . docusate sodium (COLACE) 100 MG capsule One capsule every morning and one capsule every evening 60 capsule 12  . ezetimibe (ZETIA) 10 MG tablet Take 1 tablet (10 mg total) by mouth daily. 30 tablet 5  . hydrALAZINE (APRESOLINE) 10 MG tablet Take 1 tablet (10 mg) by mouth three times a day as needed for a systolic blood pressure (top number) > 150. 90 tablet 1  . loperamide (IMODIUM A-D) 2 MG tablet One tablet by mouth as needed for diarhea up to 3 tablets in 24 hours 30 tablet 12  . metoprolol (LOPRESSOR) 50 MG tablet TAKE 2 TABLETS BY MOUTH ONCE EVERY MORNING AND TAKE 1 TABLET BY MOUTHEVERYEVENING (Patient taking differently: TAKE 1 TABLET BY MOUTH ONCE EVERY EVENING) 90 tablet 3  . Milk Thistle 175 MG CAPS Take by mouth daily.     . mirabegron ER (MYRBETRIQ) 25 MG TB24 tablet Take 1 tablet (25 mg total) by mouth daily. 30 tablet 11  . mirtazapine (REMERON) 15 MG tablet TAKE 1 TABLET BY  MOUTH AT BEDTIME 30 tablet 11  . polyethylene glycol powder (GLYCOLAX/MIRALAX) powder Take 17 g 2 (two) times daily as needed by mouth. 3350 g 5  . PREMARIN vaginal cream APPLY 1 APPLICATOR VAGINALLY 3 NIGHTS A WEEK 30 g PRN  . Rivaroxaban (XARELTO) 15 MG TABS tablet Take 1 tablet (15 mg total) by mouth daily with supper. 30 tablet 6   No current facility-administered medications for this visit.     Allergies:   Codeine; Meperidine hcl; Morphine; Pramipexole; Propoxyphene; Sinemet [carbidopa w-levodopa]; Aspirin; Diazepam; Meperidine; and Oxycodone-acetaminophen   Social History:  The patient  reports that she has never smoked. She has never used smokeless tobacco. She reports that she does not drink alcohol  or use drugs.   Family History:  The patient's   family history includes Alzheimer's disease in her father; Coronary artery disease in her father; Diabetes in her mother; Heart attack in her father.   ROS:  Please see the history of present illness.   All other systems are personally reviewed and negative.    Exam:    Vital Signs:     Well appearing, alert and conversant, regular work of breathing,  good skin color Eyes- anicteric, neuro- grossly intact, skin- no apparent rash or lesions or cyanosis, mouth- oral mucosa is pink   Labs/Other Tests and Data Reviewed:    Recent Labs: 08/23/2018: BUN 28; Creatinine, Ser 1.14; Hemoglobin 13.6; Platelets 261; Potassium 3.8; Sodium 143  Personally reviewed   Wt Readings from Last 3 Encounters:  09/24/18 160 lb 15 oz (73 kg)  08/23/18 162 lb (73.5 kg)  01/09/18 149 lb (67.6 kg)     Other studies personally reviewed:   *  Last device remote is reviewed from [place] PDF dated 4/20 which reveals normal device function,   arrhythmias -Afib with some RVR     ASSESSMENT & PLAN:   HFpEF  Permanent atrial fib with some RVR  Pacemaker Medtronic  dual chamber programmed VVIR  Orthostatic intolerance   Edema   parkinsons disease   Modest edema; will give low dose short term diuretic  Furosemide 40 mg qod x 3 doses  Renal function normal   Largely but not completely sedentary  HR relatively well controlled--  On Anticoagulation;  No bleeding issues     COVID 19 screen The patient denies symptoms of COVID 19 at this time.  The importance of social distancing was discussed today.  Follow-up: 30m  Next remote: As Scheduled   Current medicines are reviewed at length with the patient today.   The patient does not have concerns regarding her medicines.  The following changes were made today:   Begin furosemide 40 mg qod x 3 doses   Labs/ tests ordered today include:   No orders of the defined types were placed in this  encounter.   Future tests ( post COVID )     Patient Risk:  after full review of this patients clinical status, I feel that they are at moderate risk at this time.  Today, I have spent 8 minutes with the patient with telehealth technology discussing the above.  Signed, Virl Axe, MD  12/25/2018 11:12 AM     [facility] [address] [place] [address] [phone] (office) [phone] (fax)

## 2018-12-25 NOTE — Patient Instructions (Addendum)
Medication Instructions:  - Your physician has recommended you make the following change in your medication:   1) Start lasix (furosemide) 40 mg- take 1 tablet (40 mg) by mouth once every other day x 3 doses  (Order to be faxed to [facility] at [phone])  If you need a refill on your cardiac medications before your next appointment, please call your pharmacy.   Lab work: - none ordered  If you have labs (blood work) drawn today and your tests are completely normal, you will receive your results only by: Marland Kitchen MyChart Message (if you have MyChart) OR . A paper copy in the mail If you have any lab test that is abnormal or we need to change your treatment, we will call you to review the results.  Testing/Procedures: - none ordered  Follow-Up: At [facility], you and your health needs are our priority.  As part of our continuing mission to provide you with exceptional heart care, we have created designated Provider Care Teams.  These Care Teams include your primary Cardiologist (physician) and Advanced Practice Providers (APPs -  Physician Assistants and Nurse Practitioners) who all work together to provide you with the care you need, when you need it.  . You will need a follow up appointment in 6 months (November) with Dr. Caryl Comes . Please call our office 2 months in advance to schedule this appointment.  (call in early September to schedule)  Remote monitoring is used to monitor your Pacemaker of ICD from home. This monitoring reduces the number of office visits required to check your device to one time per year. It allows [place] to keep an eye on the functioning of your device to ensure it is working properly. You are scheduled for a device check from home on 02/27/2019. You may send your transmission at any time that day. If you have a wireless device, the transmission will be sent automatically. After your physician reviews your transmission, you will receive a postcard with your next  transmission date.   Any Other Special Instructions Will Be Listed Below (If Applicable). - N/A

## 2018-12-26 ENCOUNTER — Telehealth: Payer: Self-pay | Admitting: *Deleted

## 2018-12-26 NOTE — Telephone Encounter (Signed)
Patient had Telemedicine visit with Dr Caryl Comes yesterday. Order as follows written on prescription pad and signed as verbal order from Dr Caryl Comes: 1) Start lasix (furosemide) 40 mg- take 1 tablet (40 mg) by mouth once every other day x 3 doses.  Order faxed to [facility] at [phone])

## 2018-12-27 DIAGNOSIS — M6281 Muscle weakness (generalized): Secondary | ICD-10-CM | POA: Diagnosis not present

## 2018-12-27 DIAGNOSIS — R2681 Unsteadiness on feet: Secondary | ICD-10-CM | POA: Diagnosis not present

## 2018-12-27 DIAGNOSIS — G20 Parkinson's disease: Secondary | ICD-10-CM | POA: Diagnosis not present

## 2018-12-31 DIAGNOSIS — R2681 Unsteadiness on feet: Secondary | ICD-10-CM | POA: Diagnosis not present

## 2018-12-31 DIAGNOSIS — G20 Parkinson's disease: Secondary | ICD-10-CM | POA: Diagnosis not present

## 2018-12-31 DIAGNOSIS — M6281 Muscle weakness (generalized): Secondary | ICD-10-CM | POA: Diagnosis not present

## 2019-01-01 DIAGNOSIS — M6281 Muscle weakness (generalized): Secondary | ICD-10-CM | POA: Diagnosis not present

## 2019-01-01 DIAGNOSIS — G20 Parkinson's disease: Secondary | ICD-10-CM | POA: Diagnosis not present

## 2019-01-01 DIAGNOSIS — R2681 Unsteadiness on feet: Secondary | ICD-10-CM | POA: Diagnosis not present

## 2019-01-03 DIAGNOSIS — G20 Parkinson's disease: Secondary | ICD-10-CM | POA: Diagnosis not present

## 2019-01-03 DIAGNOSIS — R2681 Unsteadiness on feet: Secondary | ICD-10-CM | POA: Diagnosis not present

## 2019-01-03 DIAGNOSIS — M6281 Muscle weakness (generalized): Secondary | ICD-10-CM | POA: Diagnosis not present

## 2019-01-04 DIAGNOSIS — R443 Hallucinations, unspecified: Secondary | ICD-10-CM | POA: Diagnosis not present

## 2019-01-04 DIAGNOSIS — F419 Anxiety disorder, unspecified: Secondary | ICD-10-CM | POA: Diagnosis not present

## 2019-01-04 DIAGNOSIS — F5102 Adjustment insomnia: Secondary | ICD-10-CM | POA: Diagnosis not present

## 2019-01-04 DIAGNOSIS — G20 Parkinson's disease: Secondary | ICD-10-CM | POA: Diagnosis not present

## 2019-01-07 DIAGNOSIS — I10 Essential (primary) hypertension: Secondary | ICD-10-CM | POA: Diagnosis not present

## 2019-01-07 DIAGNOSIS — I4891 Unspecified atrial fibrillation: Secondary | ICD-10-CM | POA: Diagnosis not present

## 2019-01-07 DIAGNOSIS — M6281 Muscle weakness (generalized): Secondary | ICD-10-CM | POA: Diagnosis not present

## 2019-01-07 DIAGNOSIS — G20 Parkinson's disease: Secondary | ICD-10-CM | POA: Diagnosis not present

## 2019-01-07 DIAGNOSIS — M159 Polyosteoarthritis, unspecified: Secondary | ICD-10-CM | POA: Diagnosis not present

## 2019-01-07 DIAGNOSIS — S22030A Wedge compression fracture of third thoracic vertebra, initial encounter for closed fracture: Secondary | ICD-10-CM | POA: Diagnosis not present

## 2019-01-07 DIAGNOSIS — S8992XD Unspecified injury of left lower leg, subsequent encounter: Secondary | ICD-10-CM | POA: Diagnosis not present

## 2019-01-07 DIAGNOSIS — R2681 Unsteadiness on feet: Secondary | ICD-10-CM | POA: Diagnosis not present

## 2019-01-07 DIAGNOSIS — E785 Hyperlipidemia, unspecified: Secondary | ICD-10-CM | POA: Diagnosis not present

## 2019-01-07 DIAGNOSIS — Z950 Presence of cardiac pacemaker: Secondary | ICD-10-CM | POA: Diagnosis not present

## 2019-01-07 DIAGNOSIS — R269 Unspecified abnormalities of gait and mobility: Secondary | ICD-10-CM | POA: Diagnosis not present

## 2019-01-08 DIAGNOSIS — R2681 Unsteadiness on feet: Secondary | ICD-10-CM | POA: Diagnosis not present

## 2019-01-08 DIAGNOSIS — M6281 Muscle weakness (generalized): Secondary | ICD-10-CM | POA: Diagnosis not present

## 2019-01-08 DIAGNOSIS — G20 Parkinson's disease: Secondary | ICD-10-CM | POA: Diagnosis not present

## 2019-01-09 DIAGNOSIS — R2681 Unsteadiness on feet: Secondary | ICD-10-CM | POA: Diagnosis not present

## 2019-01-09 DIAGNOSIS — M6281 Muscle weakness (generalized): Secondary | ICD-10-CM | POA: Diagnosis not present

## 2019-01-09 DIAGNOSIS — G20 Parkinson's disease: Secondary | ICD-10-CM | POA: Diagnosis not present

## 2019-01-11 DIAGNOSIS — M6281 Muscle weakness (generalized): Secondary | ICD-10-CM | POA: Diagnosis not present

## 2019-01-11 DIAGNOSIS — G20 Parkinson's disease: Secondary | ICD-10-CM | POA: Diagnosis not present

## 2019-01-11 DIAGNOSIS — R2681 Unsteadiness on feet: Secondary | ICD-10-CM | POA: Diagnosis not present

## 2019-01-15 ENCOUNTER — Ambulatory Visit: Payer: Medicare Other

## 2019-01-16 DIAGNOSIS — G20 Parkinson's disease: Secondary | ICD-10-CM | POA: Diagnosis not present

## 2019-01-16 DIAGNOSIS — M6281 Muscle weakness (generalized): Secondary | ICD-10-CM | POA: Diagnosis not present

## 2019-01-16 DIAGNOSIS — R2681 Unsteadiness on feet: Secondary | ICD-10-CM | POA: Diagnosis not present

## 2019-01-17 DIAGNOSIS — R2681 Unsteadiness on feet: Secondary | ICD-10-CM | POA: Diagnosis not present

## 2019-01-17 DIAGNOSIS — G20 Parkinson's disease: Secondary | ICD-10-CM | POA: Diagnosis not present

## 2019-01-17 DIAGNOSIS — M6281 Muscle weakness (generalized): Secondary | ICD-10-CM | POA: Diagnosis not present

## 2019-01-21 DIAGNOSIS — G20 Parkinson's disease: Secondary | ICD-10-CM | POA: Diagnosis not present

## 2019-01-21 DIAGNOSIS — M6281 Muscle weakness (generalized): Secondary | ICD-10-CM | POA: Diagnosis not present

## 2019-01-21 DIAGNOSIS — R2681 Unsteadiness on feet: Secondary | ICD-10-CM | POA: Diagnosis not present

## 2019-01-22 DIAGNOSIS — G20 Parkinson's disease: Secondary | ICD-10-CM | POA: Diagnosis not present

## 2019-01-22 DIAGNOSIS — R2681 Unsteadiness on feet: Secondary | ICD-10-CM | POA: Diagnosis not present

## 2019-01-22 DIAGNOSIS — M6281 Muscle weakness (generalized): Secondary | ICD-10-CM | POA: Diagnosis not present

## 2019-01-24 DIAGNOSIS — M6281 Muscle weakness (generalized): Secondary | ICD-10-CM | POA: Diagnosis not present

## 2019-01-24 DIAGNOSIS — G20 Parkinson's disease: Secondary | ICD-10-CM | POA: Diagnosis not present

## 2019-01-24 DIAGNOSIS — R2681 Unsteadiness on feet: Secondary | ICD-10-CM | POA: Diagnosis not present

## 2019-01-29 DIAGNOSIS — G20 Parkinson's disease: Secondary | ICD-10-CM | POA: Diagnosis not present

## 2019-01-29 DIAGNOSIS — R2681 Unsteadiness on feet: Secondary | ICD-10-CM | POA: Diagnosis not present

## 2019-01-29 DIAGNOSIS — M6281 Muscle weakness (generalized): Secondary | ICD-10-CM | POA: Diagnosis not present

## 2019-01-31 DIAGNOSIS — R2681 Unsteadiness on feet: Secondary | ICD-10-CM | POA: Diagnosis not present

## 2019-01-31 DIAGNOSIS — M6281 Muscle weakness (generalized): Secondary | ICD-10-CM | POA: Diagnosis not present

## 2019-01-31 DIAGNOSIS — G20 Parkinson's disease: Secondary | ICD-10-CM | POA: Diagnosis not present

## 2019-02-04 DIAGNOSIS — R2681 Unsteadiness on feet: Secondary | ICD-10-CM | POA: Diagnosis not present

## 2019-02-04 DIAGNOSIS — G20 Parkinson's disease: Secondary | ICD-10-CM | POA: Diagnosis not present

## 2019-02-04 DIAGNOSIS — M6281 Muscle weakness (generalized): Secondary | ICD-10-CM | POA: Diagnosis not present

## 2019-02-07 DIAGNOSIS — B351 Tinea unguium: Secondary | ICD-10-CM | POA: Diagnosis not present

## 2019-02-07 DIAGNOSIS — I739 Peripheral vascular disease, unspecified: Secondary | ICD-10-CM | POA: Diagnosis not present

## 2019-02-08 DIAGNOSIS — M6281 Muscle weakness (generalized): Secondary | ICD-10-CM | POA: Diagnosis not present

## 2019-02-08 DIAGNOSIS — G20 Parkinson's disease: Secondary | ICD-10-CM | POA: Diagnosis not present

## 2019-02-08 DIAGNOSIS — R2681 Unsteadiness on feet: Secondary | ICD-10-CM | POA: Diagnosis not present

## 2019-02-11 DIAGNOSIS — R2681 Unsteadiness on feet: Secondary | ICD-10-CM | POA: Diagnosis not present

## 2019-02-11 DIAGNOSIS — E785 Hyperlipidemia, unspecified: Secondary | ICD-10-CM | POA: Diagnosis not present

## 2019-02-11 DIAGNOSIS — S22030A Wedge compression fracture of third thoracic vertebra, initial encounter for closed fracture: Secondary | ICD-10-CM | POA: Diagnosis not present

## 2019-02-11 DIAGNOSIS — M6281 Muscle weakness (generalized): Secondary | ICD-10-CM | POA: Diagnosis not present

## 2019-02-11 DIAGNOSIS — G20 Parkinson's disease: Secondary | ICD-10-CM | POA: Diagnosis not present

## 2019-02-11 DIAGNOSIS — Z950 Presence of cardiac pacemaker: Secondary | ICD-10-CM | POA: Diagnosis not present

## 2019-02-11 DIAGNOSIS — S8992XD Unspecified injury of left lower leg, subsequent encounter: Secondary | ICD-10-CM | POA: Diagnosis not present

## 2019-02-11 DIAGNOSIS — M159 Polyosteoarthritis, unspecified: Secondary | ICD-10-CM | POA: Diagnosis not present

## 2019-02-11 DIAGNOSIS — I10 Essential (primary) hypertension: Secondary | ICD-10-CM | POA: Diagnosis not present

## 2019-02-11 DIAGNOSIS — I4891 Unspecified atrial fibrillation: Secondary | ICD-10-CM | POA: Diagnosis not present

## 2019-02-11 DIAGNOSIS — R269 Unspecified abnormalities of gait and mobility: Secondary | ICD-10-CM | POA: Diagnosis not present

## 2019-02-12 DIAGNOSIS — G20 Parkinson's disease: Secondary | ICD-10-CM | POA: Diagnosis not present

## 2019-02-12 DIAGNOSIS — R2681 Unsteadiness on feet: Secondary | ICD-10-CM | POA: Diagnosis not present

## 2019-02-12 DIAGNOSIS — M6281 Muscle weakness (generalized): Secondary | ICD-10-CM | POA: Diagnosis not present

## 2019-02-13 DIAGNOSIS — F419 Anxiety disorder, unspecified: Secondary | ICD-10-CM | POA: Diagnosis not present

## 2019-02-13 DIAGNOSIS — F4321 Adjustment disorder with depressed mood: Secondary | ICD-10-CM | POA: Diagnosis not present

## 2019-02-14 DIAGNOSIS — G20 Parkinson's disease: Secondary | ICD-10-CM | POA: Diagnosis not present

## 2019-02-14 DIAGNOSIS — M6281 Muscle weakness (generalized): Secondary | ICD-10-CM | POA: Diagnosis not present

## 2019-02-14 DIAGNOSIS — R2681 Unsteadiness on feet: Secondary | ICD-10-CM | POA: Diagnosis not present

## 2019-02-18 DIAGNOSIS — G20 Parkinson's disease: Secondary | ICD-10-CM | POA: Diagnosis not present

## 2019-02-18 DIAGNOSIS — R2681 Unsteadiness on feet: Secondary | ICD-10-CM | POA: Diagnosis not present

## 2019-02-18 DIAGNOSIS — M6281 Muscle weakness (generalized): Secondary | ICD-10-CM | POA: Diagnosis not present

## 2019-02-19 DIAGNOSIS — F419 Anxiety disorder, unspecified: Secondary | ICD-10-CM | POA: Diagnosis not present

## 2019-02-20 DIAGNOSIS — F4321 Adjustment disorder with depressed mood: Secondary | ICD-10-CM | POA: Diagnosis not present

## 2019-02-20 DIAGNOSIS — F419 Anxiety disorder, unspecified: Secondary | ICD-10-CM | POA: Diagnosis not present

## 2019-02-21 DIAGNOSIS — G20 Parkinson's disease: Secondary | ICD-10-CM | POA: Diagnosis not present

## 2019-02-21 DIAGNOSIS — M6281 Muscle weakness (generalized): Secondary | ICD-10-CM | POA: Diagnosis not present

## 2019-02-21 DIAGNOSIS — R2681 Unsteadiness on feet: Secondary | ICD-10-CM | POA: Diagnosis not present

## 2019-02-22 DIAGNOSIS — M6281 Muscle weakness (generalized): Secondary | ICD-10-CM | POA: Diagnosis not present

## 2019-02-22 DIAGNOSIS — R2681 Unsteadiness on feet: Secondary | ICD-10-CM | POA: Diagnosis not present

## 2019-02-22 DIAGNOSIS — G20 Parkinson's disease: Secondary | ICD-10-CM | POA: Diagnosis not present

## 2019-02-25 DIAGNOSIS — G20 Parkinson's disease: Secondary | ICD-10-CM | POA: Diagnosis not present

## 2019-02-25 DIAGNOSIS — R2681 Unsteadiness on feet: Secondary | ICD-10-CM | POA: Diagnosis not present

## 2019-02-25 DIAGNOSIS — M6281 Muscle weakness (generalized): Secondary | ICD-10-CM | POA: Diagnosis not present

## 2019-02-27 ENCOUNTER — Ambulatory Visit (INDEPENDENT_AMBULATORY_CARE_PROVIDER_SITE_OTHER): Payer: Medicare Other | Admitting: *Deleted

## 2019-02-27 DIAGNOSIS — I495 Sick sinus syndrome: Secondary | ICD-10-CM

## 2019-02-27 DIAGNOSIS — F419 Anxiety disorder, unspecified: Secondary | ICD-10-CM | POA: Diagnosis not present

## 2019-02-27 DIAGNOSIS — R443 Hallucinations, unspecified: Secondary | ICD-10-CM | POA: Diagnosis not present

## 2019-02-27 DIAGNOSIS — F4321 Adjustment disorder with depressed mood: Secondary | ICD-10-CM | POA: Diagnosis not present

## 2019-02-27 DIAGNOSIS — F5102 Adjustment insomnia: Secondary | ICD-10-CM | POA: Diagnosis not present

## 2019-02-27 DIAGNOSIS — G20 Parkinson's disease: Secondary | ICD-10-CM | POA: Diagnosis not present

## 2019-02-28 DIAGNOSIS — R2681 Unsteadiness on feet: Secondary | ICD-10-CM | POA: Diagnosis not present

## 2019-02-28 DIAGNOSIS — M6281 Muscle weakness (generalized): Secondary | ICD-10-CM | POA: Diagnosis not present

## 2019-02-28 DIAGNOSIS — G20 Parkinson's disease: Secondary | ICD-10-CM | POA: Diagnosis not present

## 2019-02-28 LAB — CUP PACEART REMOTE DEVICE CHECK
Battery Impedance: 1534 Ohm
Battery Remaining Longevity: 46 mo
Battery Voltage: 2.77 V
Brady Statistic RV Percent Paced: 29 %
Date Time Interrogation Session: 20200708210711
Implantable Lead Implant Date: 20111108
Implantable Lead Implant Date: 20111108
Implantable Lead Location: 753859
Implantable Lead Location: 753860
Implantable Lead Model: 5076
Implantable Lead Model: 5076
Implantable Pulse Generator Implant Date: 20111108
Lead Channel Impedance Value: 562 Ohm
Lead Channel Pacing Threshold Amplitude: 0.875 V
Lead Channel Pacing Threshold Pulse Width: 0.4 ms
Lead Channel Sensing Intrinsic Amplitude: 8 mV
Lead Channel Setting Pacing Amplitude: 2.5 V
Lead Channel Setting Pacing Pulse Width: 0.4 ms
Lead Channel Setting Sensing Sensitivity: 4 mV

## 2019-03-04 DIAGNOSIS — M6281 Muscle weakness (generalized): Secondary | ICD-10-CM | POA: Diagnosis not present

## 2019-03-04 DIAGNOSIS — G20 Parkinson's disease: Secondary | ICD-10-CM | POA: Diagnosis not present

## 2019-03-04 DIAGNOSIS — R2681 Unsteadiness on feet: Secondary | ICD-10-CM | POA: Diagnosis not present

## 2019-03-06 DIAGNOSIS — F4321 Adjustment disorder with depressed mood: Secondary | ICD-10-CM | POA: Diagnosis not present

## 2019-03-06 DIAGNOSIS — F419 Anxiety disorder, unspecified: Secondary | ICD-10-CM | POA: Diagnosis not present

## 2019-03-07 DIAGNOSIS — R2681 Unsteadiness on feet: Secondary | ICD-10-CM | POA: Diagnosis not present

## 2019-03-07 DIAGNOSIS — M6281 Muscle weakness (generalized): Secondary | ICD-10-CM | POA: Diagnosis not present

## 2019-03-07 DIAGNOSIS — G20 Parkinson's disease: Secondary | ICD-10-CM | POA: Diagnosis not present

## 2019-03-08 DIAGNOSIS — M6281 Muscle weakness (generalized): Secondary | ICD-10-CM | POA: Diagnosis not present

## 2019-03-08 DIAGNOSIS — G20 Parkinson's disease: Secondary | ICD-10-CM | POA: Diagnosis not present

## 2019-03-08 DIAGNOSIS — R2681 Unsteadiness on feet: Secondary | ICD-10-CM | POA: Diagnosis not present

## 2019-03-11 ENCOUNTER — Encounter: Payer: Self-pay | Admitting: Cardiology

## 2019-03-11 DIAGNOSIS — M6281 Muscle weakness (generalized): Secondary | ICD-10-CM | POA: Diagnosis not present

## 2019-03-11 DIAGNOSIS — Z950 Presence of cardiac pacemaker: Secondary | ICD-10-CM | POA: Diagnosis not present

## 2019-03-11 DIAGNOSIS — S8992XD Unspecified injury of left lower leg, subsequent encounter: Secondary | ICD-10-CM | POA: Diagnosis not present

## 2019-03-11 DIAGNOSIS — R2681 Unsteadiness on feet: Secondary | ICD-10-CM | POA: Diagnosis not present

## 2019-03-11 DIAGNOSIS — M159 Polyosteoarthritis, unspecified: Secondary | ICD-10-CM | POA: Diagnosis not present

## 2019-03-11 DIAGNOSIS — I10 Essential (primary) hypertension: Secondary | ICD-10-CM | POA: Diagnosis not present

## 2019-03-11 DIAGNOSIS — S22030A Wedge compression fracture of third thoracic vertebra, initial encounter for closed fracture: Secondary | ICD-10-CM | POA: Diagnosis not present

## 2019-03-11 DIAGNOSIS — E785 Hyperlipidemia, unspecified: Secondary | ICD-10-CM | POA: Diagnosis not present

## 2019-03-11 DIAGNOSIS — G20 Parkinson's disease: Secondary | ICD-10-CM | POA: Diagnosis not present

## 2019-03-11 DIAGNOSIS — R269 Unspecified abnormalities of gait and mobility: Secondary | ICD-10-CM | POA: Diagnosis not present

## 2019-03-11 DIAGNOSIS — I4891 Unspecified atrial fibrillation: Secondary | ICD-10-CM | POA: Diagnosis not present

## 2019-03-11 NOTE — Progress Notes (Signed)
Remote pacemaker transmission.   

## 2019-03-12 DIAGNOSIS — G20 Parkinson's disease: Secondary | ICD-10-CM | POA: Diagnosis not present

## 2019-03-12 DIAGNOSIS — M6281 Muscle weakness (generalized): Secondary | ICD-10-CM | POA: Diagnosis not present

## 2019-03-12 DIAGNOSIS — R2681 Unsteadiness on feet: Secondary | ICD-10-CM | POA: Diagnosis not present

## 2019-03-13 DIAGNOSIS — F419 Anxiety disorder, unspecified: Secondary | ICD-10-CM | POA: Diagnosis not present

## 2019-03-13 DIAGNOSIS — F4321 Adjustment disorder with depressed mood: Secondary | ICD-10-CM | POA: Diagnosis not present

## 2019-03-14 DIAGNOSIS — M6281 Muscle weakness (generalized): Secondary | ICD-10-CM | POA: Diagnosis not present

## 2019-03-14 DIAGNOSIS — G20 Parkinson's disease: Secondary | ICD-10-CM | POA: Diagnosis not present

## 2019-03-14 DIAGNOSIS — R2681 Unsteadiness on feet: Secondary | ICD-10-CM | POA: Diagnosis not present

## 2019-03-19 DIAGNOSIS — R2681 Unsteadiness on feet: Secondary | ICD-10-CM | POA: Diagnosis not present

## 2019-03-19 DIAGNOSIS — G20 Parkinson's disease: Secondary | ICD-10-CM | POA: Diagnosis not present

## 2019-03-19 DIAGNOSIS — M6281 Muscle weakness (generalized): Secondary | ICD-10-CM | POA: Diagnosis not present

## 2019-03-20 DIAGNOSIS — F419 Anxiety disorder, unspecified: Secondary | ICD-10-CM | POA: Diagnosis not present

## 2019-03-20 DIAGNOSIS — F4321 Adjustment disorder with depressed mood: Secondary | ICD-10-CM | POA: Diagnosis not present

## 2019-03-22 DIAGNOSIS — M6281 Muscle weakness (generalized): Secondary | ICD-10-CM | POA: Diagnosis not present

## 2019-03-22 DIAGNOSIS — F419 Anxiety disorder, unspecified: Secondary | ICD-10-CM | POA: Diagnosis not present

## 2019-03-22 DIAGNOSIS — R2681 Unsteadiness on feet: Secondary | ICD-10-CM | POA: Diagnosis not present

## 2019-03-22 DIAGNOSIS — G20 Parkinson's disease: Secondary | ICD-10-CM | POA: Diagnosis not present

## 2019-03-26 DIAGNOSIS — G20 Parkinson's disease: Secondary | ICD-10-CM | POA: Diagnosis not present

## 2019-03-26 DIAGNOSIS — R2681 Unsteadiness on feet: Secondary | ICD-10-CM | POA: Diagnosis not present

## 2019-03-26 DIAGNOSIS — M6281 Muscle weakness (generalized): Secondary | ICD-10-CM | POA: Diagnosis not present

## 2019-03-27 DIAGNOSIS — F4321 Adjustment disorder with depressed mood: Secondary | ICD-10-CM | POA: Diagnosis not present

## 2019-03-27 DIAGNOSIS — F419 Anxiety disorder, unspecified: Secondary | ICD-10-CM | POA: Diagnosis not present

## 2019-03-28 DIAGNOSIS — R2681 Unsteadiness on feet: Secondary | ICD-10-CM | POA: Diagnosis not present

## 2019-03-28 DIAGNOSIS — G20 Parkinson's disease: Secondary | ICD-10-CM | POA: Diagnosis not present

## 2019-03-28 DIAGNOSIS — M6281 Muscle weakness (generalized): Secondary | ICD-10-CM | POA: Diagnosis not present

## 2019-04-03 DIAGNOSIS — F4321 Adjustment disorder with depressed mood: Secondary | ICD-10-CM | POA: Diagnosis not present

## 2019-04-03 DIAGNOSIS — F419 Anxiety disorder, unspecified: Secondary | ICD-10-CM | POA: Diagnosis not present

## 2019-04-05 DIAGNOSIS — G20 Parkinson's disease: Secondary | ICD-10-CM | POA: Diagnosis not present

## 2019-04-05 DIAGNOSIS — M6281 Muscle weakness (generalized): Secondary | ICD-10-CM | POA: Diagnosis not present

## 2019-04-05 DIAGNOSIS — R2681 Unsteadiness on feet: Secondary | ICD-10-CM | POA: Diagnosis not present

## 2019-04-10 DIAGNOSIS — F4321 Adjustment disorder with depressed mood: Secondary | ICD-10-CM | POA: Diagnosis not present

## 2019-04-10 DIAGNOSIS — F419 Anxiety disorder, unspecified: Secondary | ICD-10-CM | POA: Diagnosis not present

## 2019-04-11 DIAGNOSIS — G20 Parkinson's disease: Secondary | ICD-10-CM | POA: Diagnosis not present

## 2019-04-11 DIAGNOSIS — M6281 Muscle weakness (generalized): Secondary | ICD-10-CM | POA: Diagnosis not present

## 2019-04-11 DIAGNOSIS — R2681 Unsteadiness on feet: Secondary | ICD-10-CM | POA: Diagnosis not present

## 2019-04-11 DIAGNOSIS — R269 Unspecified abnormalities of gait and mobility: Secondary | ICD-10-CM | POA: Diagnosis not present

## 2019-04-11 DIAGNOSIS — N3281 Overactive bladder: Secondary | ICD-10-CM | POA: Diagnosis not present

## 2019-04-11 DIAGNOSIS — M25511 Pain in right shoulder: Secondary | ICD-10-CM | POA: Diagnosis not present

## 2019-04-15 DIAGNOSIS — G20 Parkinson's disease: Secondary | ICD-10-CM | POA: Diagnosis not present

## 2019-04-15 DIAGNOSIS — R2681 Unsteadiness on feet: Secondary | ICD-10-CM | POA: Diagnosis not present

## 2019-04-15 DIAGNOSIS — M6281 Muscle weakness (generalized): Secondary | ICD-10-CM | POA: Diagnosis not present

## 2019-04-16 DIAGNOSIS — R2681 Unsteadiness on feet: Secondary | ICD-10-CM | POA: Diagnosis not present

## 2019-04-16 DIAGNOSIS — M6281 Muscle weakness (generalized): Secondary | ICD-10-CM | POA: Diagnosis not present

## 2019-04-16 DIAGNOSIS — G20 Parkinson's disease: Secondary | ICD-10-CM | POA: Diagnosis not present

## 2019-04-17 DIAGNOSIS — F419 Anxiety disorder, unspecified: Secondary | ICD-10-CM | POA: Diagnosis not present

## 2019-04-17 DIAGNOSIS — F4321 Adjustment disorder with depressed mood: Secondary | ICD-10-CM | POA: Diagnosis not present

## 2019-04-19 DIAGNOSIS — G20 Parkinson's disease: Secondary | ICD-10-CM | POA: Diagnosis not present

## 2019-04-19 DIAGNOSIS — R2681 Unsteadiness on feet: Secondary | ICD-10-CM | POA: Diagnosis not present

## 2019-04-19 DIAGNOSIS — M6281 Muscle weakness (generalized): Secondary | ICD-10-CM | POA: Diagnosis not present

## 2019-04-23 DIAGNOSIS — G20 Parkinson's disease: Secondary | ICD-10-CM | POA: Diagnosis not present

## 2019-04-23 DIAGNOSIS — M6281 Muscle weakness (generalized): Secondary | ICD-10-CM | POA: Diagnosis not present

## 2019-04-23 DIAGNOSIS — R2681 Unsteadiness on feet: Secondary | ICD-10-CM | POA: Diagnosis not present

## 2019-04-24 DIAGNOSIS — G20 Parkinson's disease: Secondary | ICD-10-CM | POA: Diagnosis not present

## 2019-04-24 DIAGNOSIS — M6281 Muscle weakness (generalized): Secondary | ICD-10-CM | POA: Diagnosis not present

## 2019-04-24 DIAGNOSIS — R2681 Unsteadiness on feet: Secondary | ICD-10-CM | POA: Diagnosis not present

## 2019-04-30 DIAGNOSIS — M810 Age-related osteoporosis without current pathological fracture: Secondary | ICD-10-CM | POA: Diagnosis not present

## 2019-04-30 DIAGNOSIS — I10 Essential (primary) hypertension: Secondary | ICD-10-CM | POA: Diagnosis not present

## 2019-04-30 DIAGNOSIS — Z79899 Other long term (current) drug therapy: Secondary | ICD-10-CM | POA: Diagnosis not present

## 2019-04-30 DIAGNOSIS — E785 Hyperlipidemia, unspecified: Secondary | ICD-10-CM | POA: Diagnosis not present

## 2019-05-03 DIAGNOSIS — G20 Parkinson's disease: Secondary | ICD-10-CM | POA: Diagnosis not present

## 2019-05-03 DIAGNOSIS — M6281 Muscle weakness (generalized): Secondary | ICD-10-CM | POA: Diagnosis not present

## 2019-05-03 DIAGNOSIS — R2681 Unsteadiness on feet: Secondary | ICD-10-CM | POA: Diagnosis not present

## 2019-05-04 DIAGNOSIS — I739 Peripheral vascular disease, unspecified: Secondary | ICD-10-CM | POA: Diagnosis not present

## 2019-05-04 DIAGNOSIS — B351 Tinea unguium: Secondary | ICD-10-CM | POA: Diagnosis not present

## 2019-05-06 DIAGNOSIS — M6281 Muscle weakness (generalized): Secondary | ICD-10-CM | POA: Diagnosis not present

## 2019-05-06 DIAGNOSIS — G20 Parkinson's disease: Secondary | ICD-10-CM | POA: Diagnosis not present

## 2019-05-06 DIAGNOSIS — R2681 Unsteadiness on feet: Secondary | ICD-10-CM | POA: Diagnosis not present

## 2019-05-08 DIAGNOSIS — G20 Parkinson's disease: Secondary | ICD-10-CM | POA: Diagnosis not present

## 2019-05-08 DIAGNOSIS — R2681 Unsteadiness on feet: Secondary | ICD-10-CM | POA: Diagnosis not present

## 2019-05-08 DIAGNOSIS — M6281 Muscle weakness (generalized): Secondary | ICD-10-CM | POA: Diagnosis not present

## 2019-05-08 DIAGNOSIS — F419 Anxiety disorder, unspecified: Secondary | ICD-10-CM | POA: Diagnosis not present

## 2019-05-08 DIAGNOSIS — F4321 Adjustment disorder with depressed mood: Secondary | ICD-10-CM | POA: Diagnosis not present

## 2019-05-14 DIAGNOSIS — G20 Parkinson's disease: Secondary | ICD-10-CM | POA: Diagnosis not present

## 2019-05-14 DIAGNOSIS — F4321 Adjustment disorder with depressed mood: Secondary | ICD-10-CM | POA: Diagnosis not present

## 2019-05-14 DIAGNOSIS — N3281 Overactive bladder: Secondary | ICD-10-CM | POA: Diagnosis not present

## 2019-05-14 DIAGNOSIS — I4891 Unspecified atrial fibrillation: Secondary | ICD-10-CM | POA: Diagnosis not present

## 2019-05-14 DIAGNOSIS — R269 Unspecified abnormalities of gait and mobility: Secondary | ICD-10-CM | POA: Diagnosis not present

## 2019-05-14 DIAGNOSIS — F419 Anxiety disorder, unspecified: Secondary | ICD-10-CM | POA: Diagnosis not present

## 2019-05-14 DIAGNOSIS — M25511 Pain in right shoulder: Secondary | ICD-10-CM | POA: Diagnosis not present

## 2019-05-15 DIAGNOSIS — M6281 Muscle weakness (generalized): Secondary | ICD-10-CM | POA: Diagnosis not present

## 2019-05-15 DIAGNOSIS — R2681 Unsteadiness on feet: Secondary | ICD-10-CM | POA: Diagnosis not present

## 2019-05-15 DIAGNOSIS — G20 Parkinson's disease: Secondary | ICD-10-CM | POA: Diagnosis not present

## 2019-05-21 DIAGNOSIS — R443 Hallucinations, unspecified: Secondary | ICD-10-CM | POA: Diagnosis not present

## 2019-05-21 DIAGNOSIS — F5102 Adjustment insomnia: Secondary | ICD-10-CM | POA: Diagnosis not present

## 2019-05-21 DIAGNOSIS — F419 Anxiety disorder, unspecified: Secondary | ICD-10-CM | POA: Diagnosis not present

## 2019-05-21 DIAGNOSIS — G20 Parkinson's disease: Secondary | ICD-10-CM | POA: Diagnosis not present

## 2019-05-22 DIAGNOSIS — F4321 Adjustment disorder with depressed mood: Secondary | ICD-10-CM | POA: Diagnosis not present

## 2019-05-22 DIAGNOSIS — F419 Anxiety disorder, unspecified: Secondary | ICD-10-CM | POA: Diagnosis not present

## 2019-05-29 ENCOUNTER — Ambulatory Visit (INDEPENDENT_AMBULATORY_CARE_PROVIDER_SITE_OTHER): Payer: Medicare Other | Admitting: *Deleted

## 2019-05-29 DIAGNOSIS — I5032 Chronic diastolic (congestive) heart failure: Secondary | ICD-10-CM | POA: Diagnosis not present

## 2019-05-29 DIAGNOSIS — I4811 Longstanding persistent atrial fibrillation: Secondary | ICD-10-CM

## 2019-05-29 DIAGNOSIS — F419 Anxiety disorder, unspecified: Secondary | ICD-10-CM | POA: Diagnosis not present

## 2019-05-29 DIAGNOSIS — F4321 Adjustment disorder with depressed mood: Secondary | ICD-10-CM | POA: Diagnosis not present

## 2019-06-01 LAB — CUP PACEART REMOTE DEVICE CHECK
Battery Impedance: 1649 Ohm
Battery Remaining Longevity: 43 mo
Battery Voltage: 2.76 V
Brady Statistic RV Percent Paced: 31 %
Date Time Interrogation Session: 20201009175829
Implantable Lead Implant Date: 20111108
Implantable Lead Implant Date: 20111108
Implantable Lead Location: 753859
Implantable Lead Location: 753860
Implantable Lead Model: 5076
Implantable Lead Model: 5076
Implantable Pulse Generator Implant Date: 20111108
Lead Channel Impedance Value: 541 Ohm
Lead Channel Impedance Value: 67 Ohm
Lead Channel Pacing Threshold Amplitude: 0.75 V
Lead Channel Pacing Threshold Pulse Width: 0.4 ms
Lead Channel Setting Pacing Amplitude: 2.5 V
Lead Channel Setting Pacing Pulse Width: 0.4 ms
Lead Channel Setting Sensing Sensitivity: 4 mV

## 2019-06-05 DIAGNOSIS — Z79899 Other long term (current) drug therapy: Secondary | ICD-10-CM | POA: Diagnosis not present

## 2019-06-05 DIAGNOSIS — N390 Urinary tract infection, site not specified: Secondary | ICD-10-CM | POA: Diagnosis not present

## 2019-06-07 DIAGNOSIS — F419 Anxiety disorder, unspecified: Secondary | ICD-10-CM | POA: Diagnosis not present

## 2019-06-07 DIAGNOSIS — F4321 Adjustment disorder with depressed mood: Secondary | ICD-10-CM | POA: Diagnosis not present

## 2019-06-10 NOTE — Progress Notes (Signed)
Remote pacemaker transmission.   

## 2019-06-12 DIAGNOSIS — F419 Anxiety disorder, unspecified: Secondary | ICD-10-CM | POA: Diagnosis not present

## 2019-06-12 DIAGNOSIS — F4321 Adjustment disorder with depressed mood: Secondary | ICD-10-CM | POA: Diagnosis not present

## 2019-06-13 DIAGNOSIS — G8929 Other chronic pain: Secondary | ICD-10-CM | POA: Diagnosis not present

## 2019-06-13 DIAGNOSIS — G20 Parkinson's disease: Secondary | ICD-10-CM | POA: Diagnosis not present

## 2019-06-13 DIAGNOSIS — M545 Low back pain: Secondary | ICD-10-CM | POA: Diagnosis not present

## 2019-06-13 DIAGNOSIS — I4891 Unspecified atrial fibrillation: Secondary | ICD-10-CM | POA: Diagnosis not present

## 2019-06-13 DIAGNOSIS — N3281 Overactive bladder: Secondary | ICD-10-CM | POA: Diagnosis not present

## 2019-06-17 ENCOUNTER — Telehealth: Payer: Self-pay

## 2019-06-17 NOTE — Telephone Encounter (Signed)
Spoke with daughter per DPR- advised her I was calling to schedule an AWV rather it be a telephonic or in office visit. Daughter declined stating her mother was now seeing house call doctors only to prevent her from coming in the office. Stated understanding and removed her from the call list.

## 2019-06-19 DIAGNOSIS — F4321 Adjustment disorder with depressed mood: Secondary | ICD-10-CM | POA: Diagnosis not present

## 2019-06-19 DIAGNOSIS — F419 Anxiety disorder, unspecified: Secondary | ICD-10-CM | POA: Diagnosis not present

## 2019-06-21 DIAGNOSIS — G20 Parkinson's disease: Secondary | ICD-10-CM | POA: Diagnosis not present

## 2019-06-21 DIAGNOSIS — R443 Hallucinations, unspecified: Secondary | ICD-10-CM | POA: Diagnosis not present

## 2019-06-21 DIAGNOSIS — F5102 Adjustment insomnia: Secondary | ICD-10-CM | POA: Diagnosis not present

## 2019-06-21 DIAGNOSIS — F419 Anxiety disorder, unspecified: Secondary | ICD-10-CM | POA: Diagnosis not present

## 2019-06-26 DIAGNOSIS — F4321 Adjustment disorder with depressed mood: Secondary | ICD-10-CM | POA: Diagnosis not present

## 2019-06-26 DIAGNOSIS — F419 Anxiety disorder, unspecified: Secondary | ICD-10-CM | POA: Diagnosis not present

## 2019-07-10 DIAGNOSIS — F419 Anxiety disorder, unspecified: Secondary | ICD-10-CM | POA: Diagnosis not present

## 2019-07-10 DIAGNOSIS — I739 Peripheral vascular disease, unspecified: Secondary | ICD-10-CM | POA: Diagnosis not present

## 2019-07-10 DIAGNOSIS — B351 Tinea unguium: Secondary | ICD-10-CM | POA: Diagnosis not present

## 2019-07-10 DIAGNOSIS — R600 Localized edema: Secondary | ICD-10-CM | POA: Diagnosis not present

## 2019-07-10 DIAGNOSIS — Q845 Enlarged and hypertrophic nails: Secondary | ICD-10-CM | POA: Diagnosis not present

## 2019-07-10 DIAGNOSIS — G20 Parkinson's disease: Secondary | ICD-10-CM | POA: Diagnosis not present

## 2019-07-10 DIAGNOSIS — F4321 Adjustment disorder with depressed mood: Secondary | ICD-10-CM | POA: Diagnosis not present

## 2019-07-10 DIAGNOSIS — R269 Unspecified abnormalities of gait and mobility: Secondary | ICD-10-CM | POA: Diagnosis not present

## 2019-07-11 DIAGNOSIS — M545 Low back pain: Secondary | ICD-10-CM | POA: Diagnosis not present

## 2019-07-11 DIAGNOSIS — G20 Parkinson's disease: Secondary | ICD-10-CM | POA: Diagnosis not present

## 2019-07-11 DIAGNOSIS — G8929 Other chronic pain: Secondary | ICD-10-CM | POA: Diagnosis not present

## 2019-07-11 DIAGNOSIS — I10 Essential (primary) hypertension: Secondary | ICD-10-CM | POA: Diagnosis not present

## 2019-07-11 DIAGNOSIS — I4891 Unspecified atrial fibrillation: Secondary | ICD-10-CM | POA: Diagnosis not present

## 2019-07-17 DIAGNOSIS — F419 Anxiety disorder, unspecified: Secondary | ICD-10-CM | POA: Diagnosis not present

## 2019-07-17 DIAGNOSIS — F4321 Adjustment disorder with depressed mood: Secondary | ICD-10-CM | POA: Diagnosis not present

## 2019-07-24 DIAGNOSIS — F419 Anxiety disorder, unspecified: Secondary | ICD-10-CM | POA: Diagnosis not present

## 2019-07-24 DIAGNOSIS — F4321 Adjustment disorder with depressed mood: Secondary | ICD-10-CM | POA: Diagnosis not present

## 2019-08-28 ENCOUNTER — Ambulatory Visit (INDEPENDENT_AMBULATORY_CARE_PROVIDER_SITE_OTHER): Payer: Medicare Other | Admitting: *Deleted

## 2019-08-28 DIAGNOSIS — I5032 Chronic diastolic (congestive) heart failure: Secondary | ICD-10-CM | POA: Diagnosis not present

## 2019-08-29 ENCOUNTER — Telehealth: Payer: Self-pay

## 2019-08-29 LAB — CUP PACEART REMOTE DEVICE CHECK
Battery Impedance: 1708 Ohm
Battery Remaining Longevity: 42 mo
Battery Voltage: 2.76 V
Brady Statistic RV Percent Paced: 31 %
Date Time Interrogation Session: 20210107104223
Implantable Lead Implant Date: 20111108
Implantable Lead Implant Date: 20111108
Implantable Lead Location: 753859
Implantable Lead Location: 753860
Implantable Lead Model: 5076
Implantable Lead Model: 5076
Implantable Pulse Generator Implant Date: 20111108
Lead Channel Impedance Value: 543 Ohm
Lead Channel Impedance Value: 67 Ohm
Lead Channel Pacing Threshold Amplitude: 0.75 V
Lead Channel Pacing Threshold Pulse Width: 0.4 ms
Lead Channel Setting Pacing Amplitude: 2.5 V
Lead Channel Setting Pacing Pulse Width: 0.4 ms
Lead Channel Setting Sensing Sensitivity: 4 mV

## 2019-08-29 NOTE — Telephone Encounter (Signed)
Pt daughter states the pt nurse put the Medtronic app on her personal phone instead of using the pt phone to do the transmission. I ensured the Whitney Hoffman that Whitney Hoffman transmission has her information on there. Pt daughter states she was checking and is very upset that the nurse at the facility put the app on her personal phone.

## 2019-09-14 IMAGING — CT CT HEAD W/O CM
4 of 8 series · 15 of 47 positions shown, 16 images · non-contrast
Comparison: CT HEAD January 22, 2016. thoracic spine radiograph January 02, 2017

CLINICAL DATA: Unwitnessed fall at care facility. Struck back of
head on carpeted floor. No loss of consciousness. History of
Parkinson's disease, hypertension and hyperlipidemia.

EXAM:
CT HEAD WITHOUT CONTRAST
CT CERVICAL SPINE WITHOUT CONTRAST
TECHNIQUE: Multidetector CT imaging of the head and cervical spine was
performed following the standard protocol without intravenous
contrast. Multiplanar CT image reconstructions of the cervical spine
were also generated.

[Series 3: head wo · axial · 0.42mm/px · z∈[+129,+274]mm · 3 of 30 slices shown, 4 images]
[im 1/30  brain]
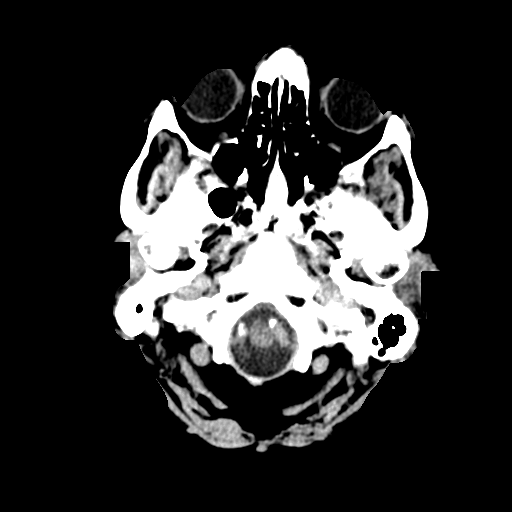
[im 1/30  bone]
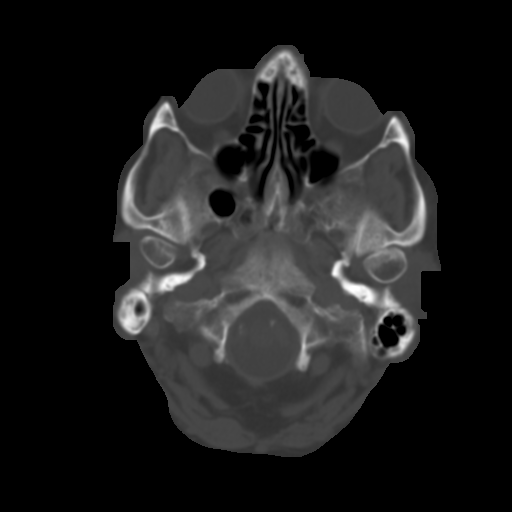
[im 15/30  brain]
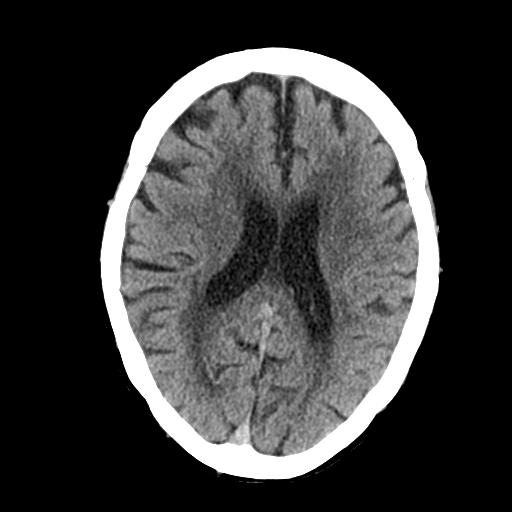
[im 30/30  brain]
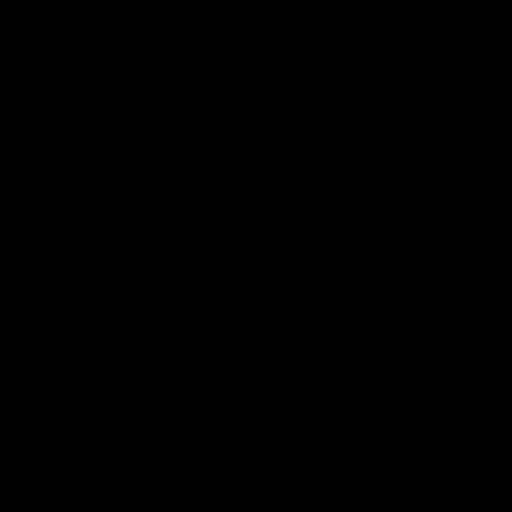

[Series 5: coronal soft tissue · coronal · 0.29mm/px · 3 of 64 slices shown]
[im 19/64  brain]
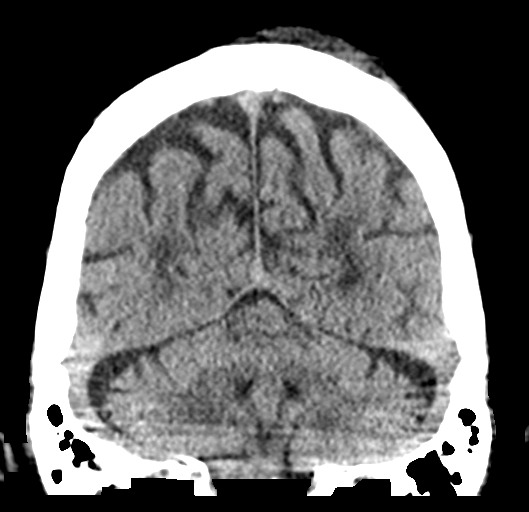
[im 28/64  brain]
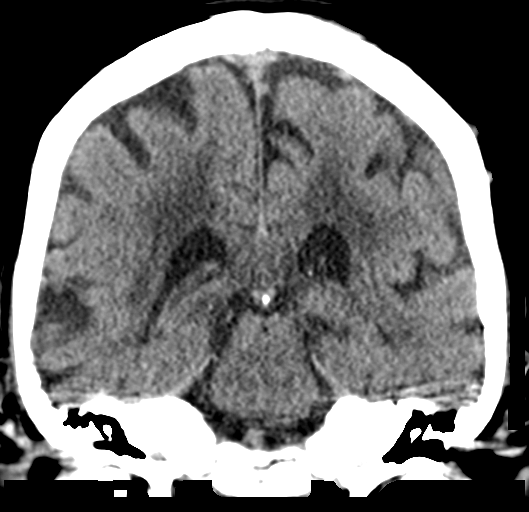
[im 37/64  brain]
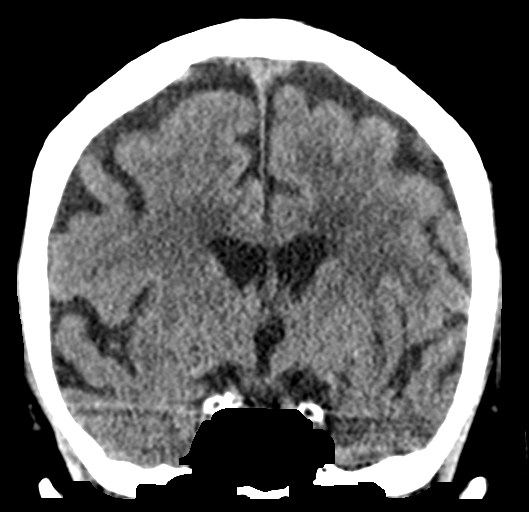

[Series 6: sagittal soft tissue · sagittal · 0.29mm/px · 1 of 51 slices shown]
[im 26/51  brain]
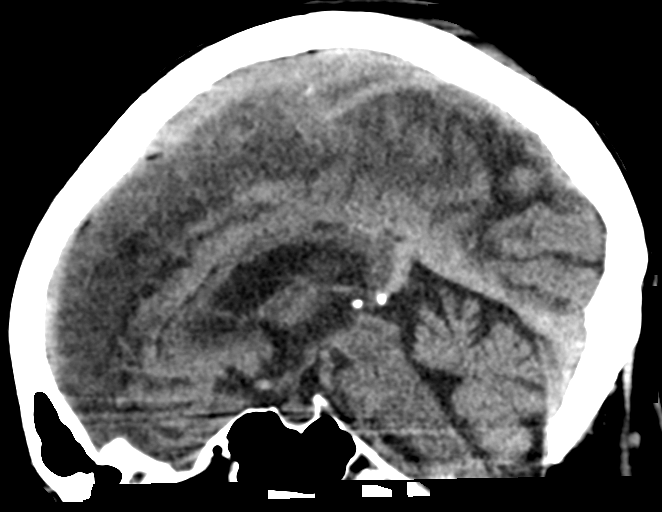

[Series 10: orthogonal bone · axial · 0.21mm/px · z∈[-92,+79]mm · 8 of 127 slices shown]
[im 12/127  bone]
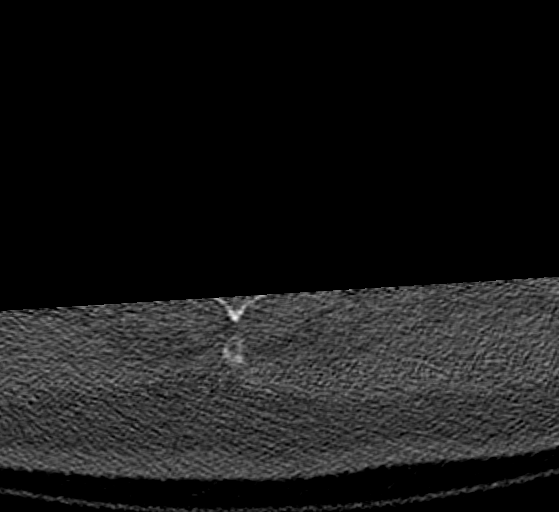
[im 23/127  bone]
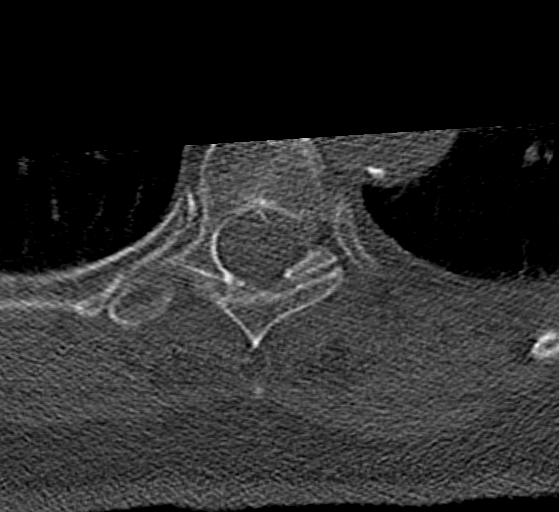
[im 46/127  bone]
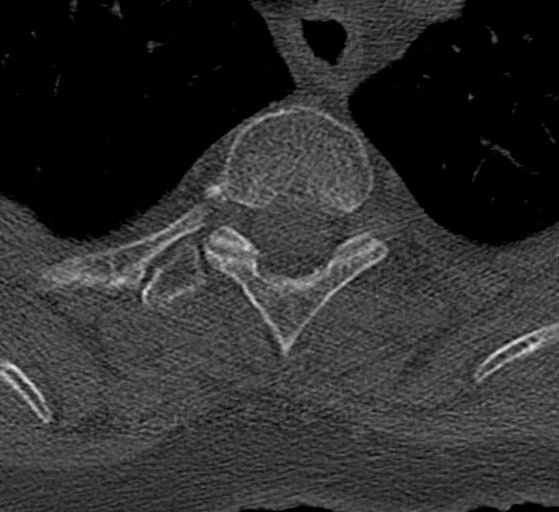
[im 58/127  bone]
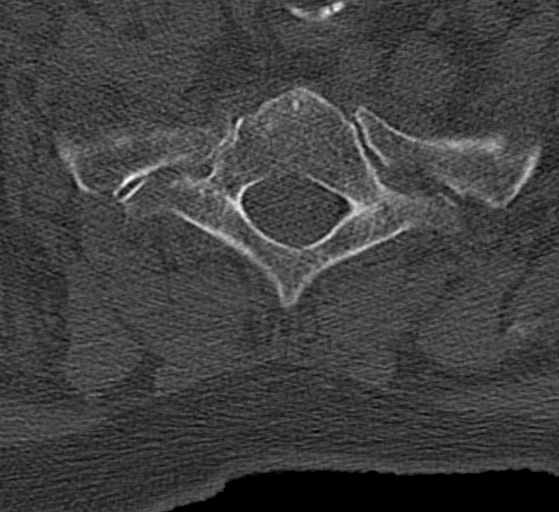
[im 69/127  bone]
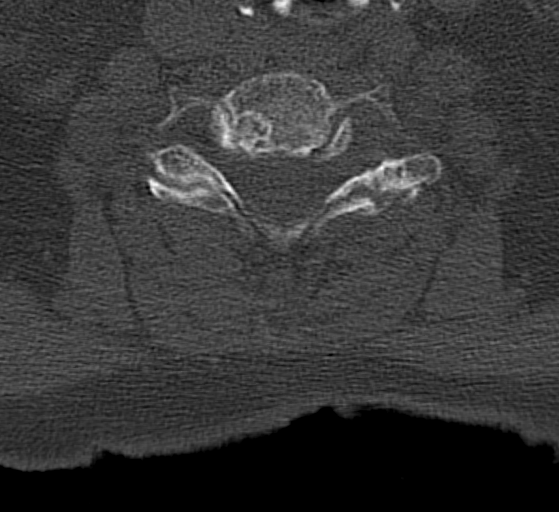
[im 81/127  bone]
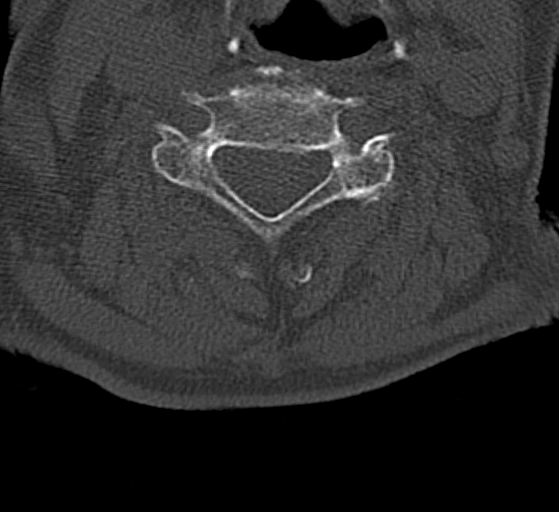
[im 104/127  bone]
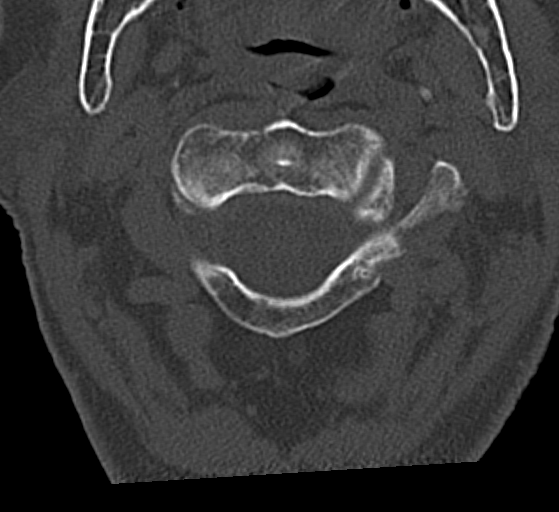
[im 115/127  bone]
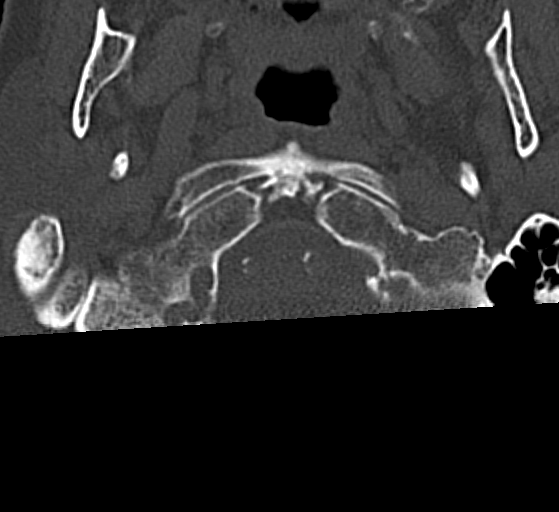

[15 of 47 positions shown; findings below may reference images not displayed]

FINDINGS: CT HEAD FINDINGS

BRAIN: No intraparenchymal hemorrhage, mass effect nor midline
shift. No parenchymal brain volume loss for age. No hydrocephalus.
Old RIGHT and possibly LEFT basal ganglia lacunar infarcts. Patchy
supratentorial white matter hypodensities within normal range for
patient's age, though non-specific are most compatible with chronic
small vessel ischemic disease. No acute large vascular territory
infarcts. No abnormal extra-axial fluid collections. Basal cisterns
are patent.

VASCULAR: Moderate calcific atherosclerosis of the carotid siphons.

SKULL: No skull fracture. Osteopenia. Moderate LEFT parietal vertex
scalp hematoma without subcutaneous gas or radiopaque foreign
bodies.

SINUSES/ORBITS: Paranasal sinuses are well aerated. Mastoid air
cells are well aerated.The included ocular globes and orbital
contents are non-suspicious.

OTHER: None.

CT CERVICAL SPINE FINDINGS

ALIGNMENT: Maintained lordosis. No malalignment. Cervicothoracic
levoscoliosis.

SKULL BASE AND VERTEBRAE: Cervical vertebral bodies and posterior
elements are intact. Moderate T3 compression fracture with further
height loss from prior radiograph. Severe C4-5 disc height loss and
endplate spurring compatible with degenerative discs. Severe LEFT
C3-4 facet arthropathy. No destructive bony lesions. C1-2
articulation maintained.

SOFT TISSUES AND SPINAL CANAL: Nonacute. Moderate calcific
atherosclerosis carotid bifurcations. LEFT pacemaker wires.

DISC LEVELS: No high-grade osseous canal stenosis. Moderate C3-4,
LEFT C4-5 neural foraminal narrowing.

UPPER CHEST: Lung apices are clear.

OTHER: None.
IMPRESSION: CT HEAD:

1. No acute intracranial process.  LEFT posterior scalp hematoma.
2. Stable examination including moderate chronic small vessel
ischemic changes and old basal ganglia lacunar infarcts.

CT CERVICAL SPINE:

1. Moderate T3 compression fracture, possibly acute. Recommend
correlation with point tenderness.
2. No cervical spine fracture or malalignment.

## 2019-09-14 IMAGING — CR DG HIP (WITH OR WITHOUT PELVIS) 2-3V*R*
1 series · 3 of 3 positions shown · non-contrast
Comparison: 11/24/2015

CLINICAL DATA: Unwitnessed fall. Left knee and right hip pain.

EXAM:
DG HIP (WITH OR WITHOUT PELVIS) 2-3V RIGHT

[Series 1: dg hip unilat w or w/o pelvis 2-3 views  · non-contrast · 0.14mm/px · 3 of 3 slices shown]
[im 1/3]
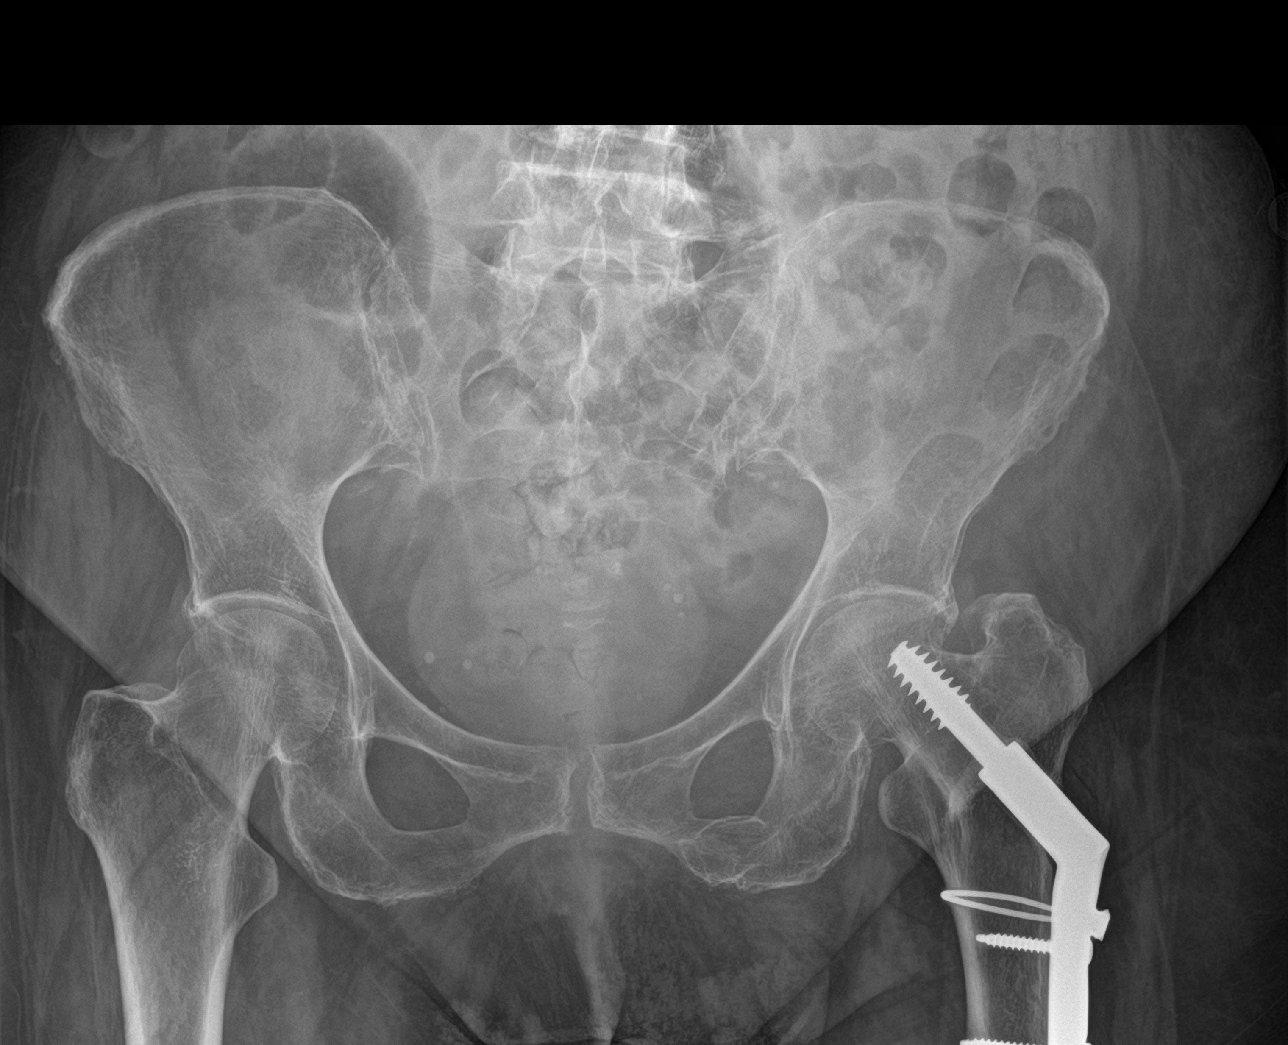
[im 2/3]
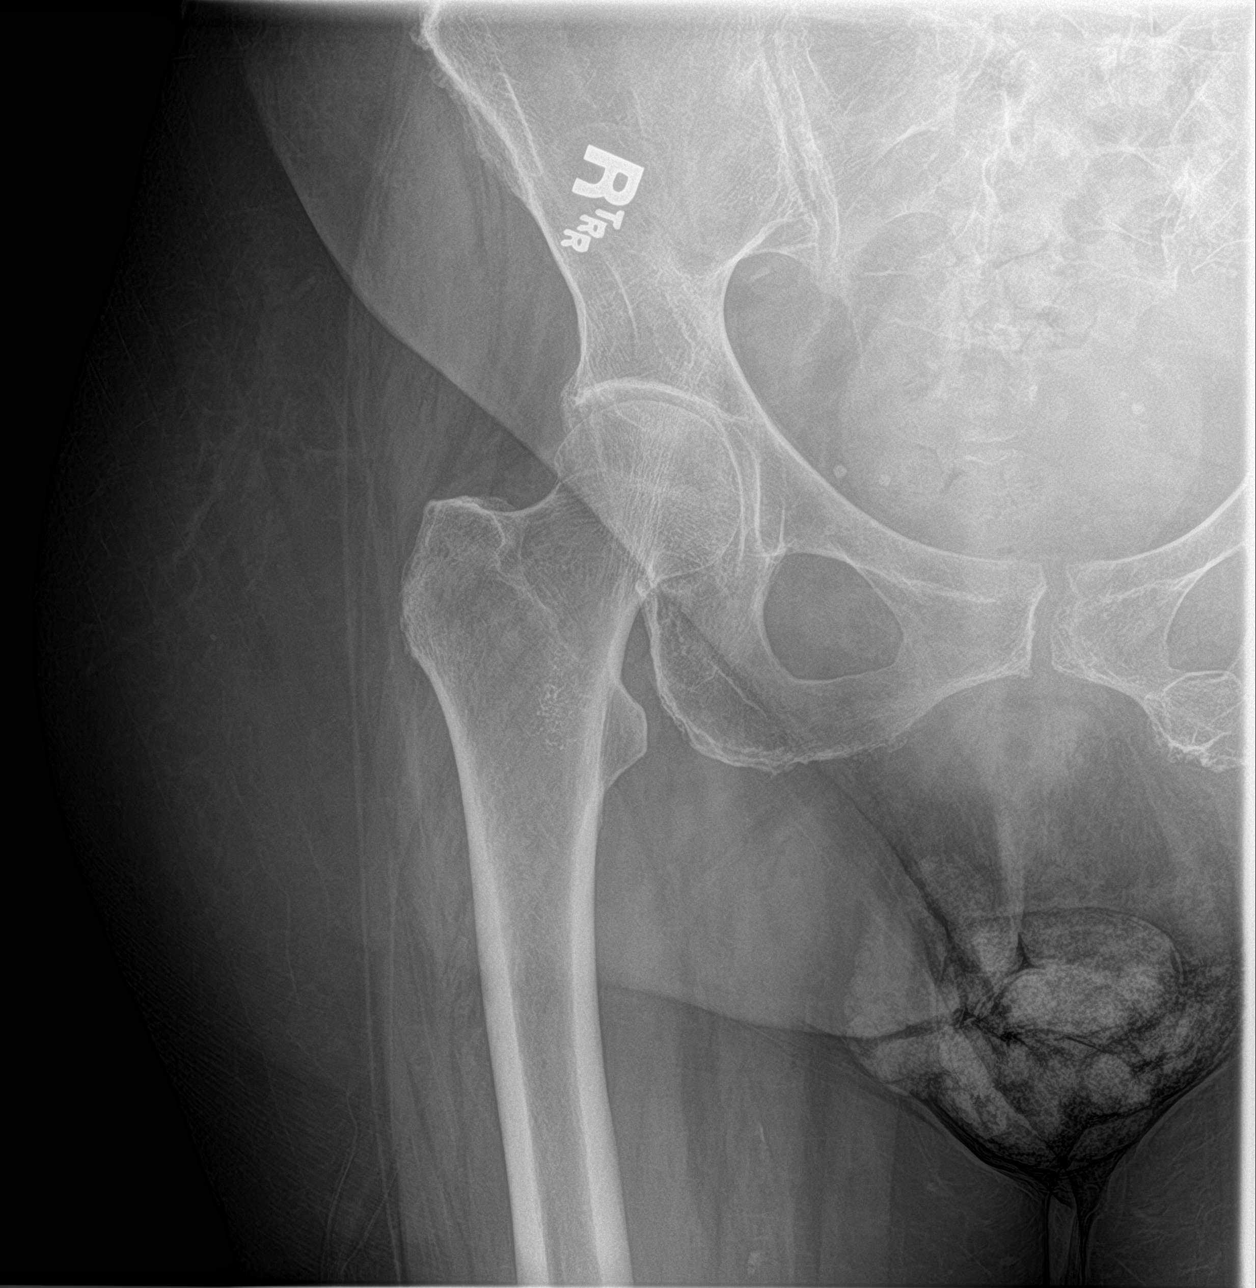
[im 3/3]
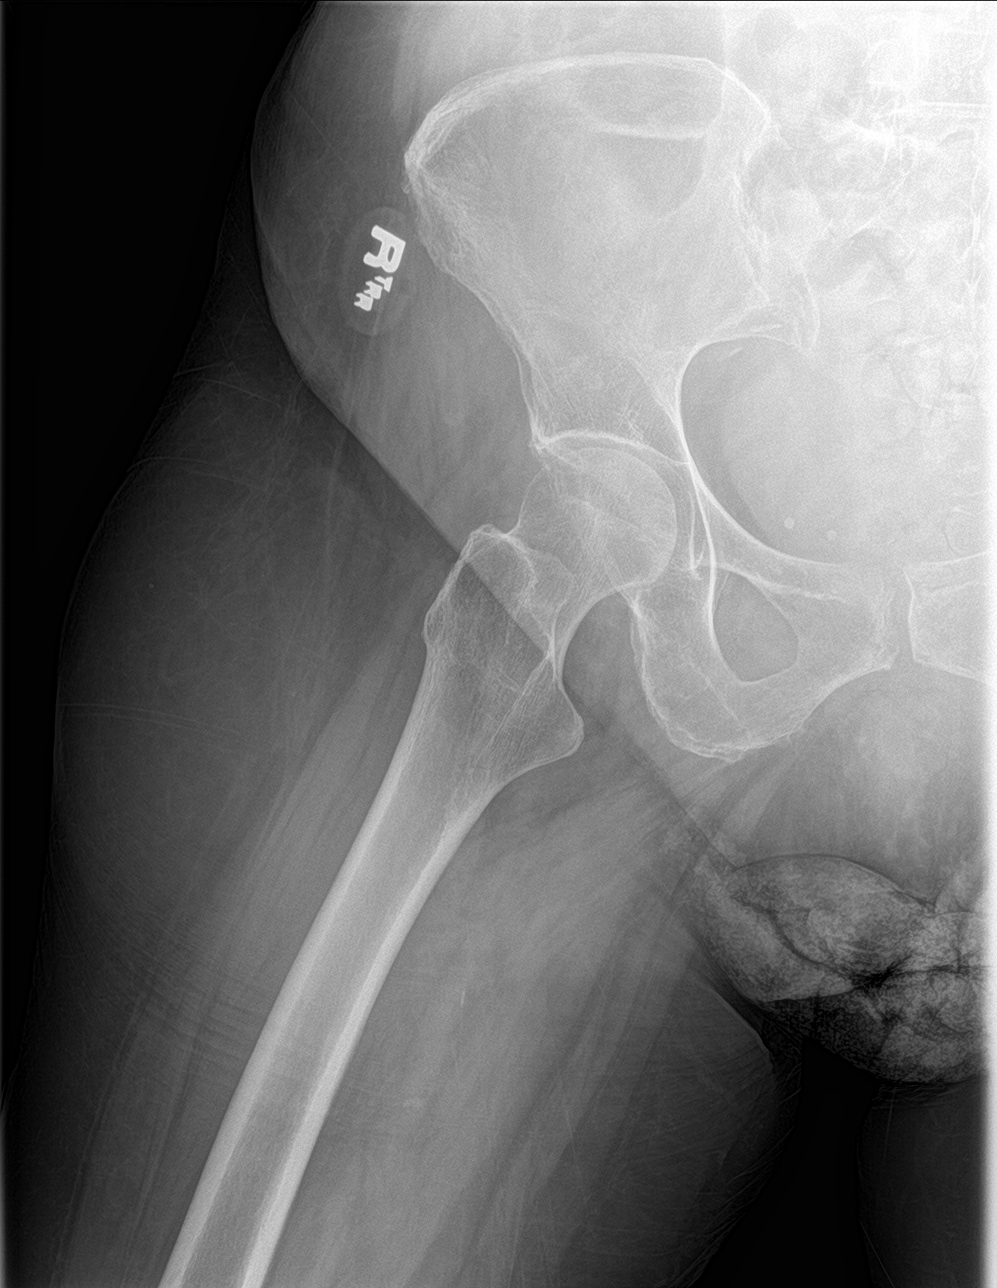

[3 of 3 positions shown; findings below may reference images not displayed]

FINDINGS: Mild degenerative changes in the right hip. No evidence of acute
fracture or dislocation. Old fracture deformity of the left inferior
pubic ramus. Previous internal fixation of the left hip.
Degenerative changes in the lower lumbar spine and left hip. SI
joints and symphysis pubis are not displaced.
IMPRESSION: No acute bony abnormalities.

## 2019-11-27 ENCOUNTER — Ambulatory Visit (INDEPENDENT_AMBULATORY_CARE_PROVIDER_SITE_OTHER): Payer: Medicare Other | Admitting: *Deleted

## 2019-11-27 DIAGNOSIS — I5032 Chronic diastolic (congestive) heart failure: Secondary | ICD-10-CM

## 2019-11-28 ENCOUNTER — Telehealth: Payer: Self-pay | Admitting: Internal Medicine

## 2019-11-28 NOTE — Telephone Encounter (Signed)
New message      1. Has your device fired? no  2. Is you device beeping? no  3. Are you experiencing draining or swelling at device site? no  4. Are you calling to see if we received your device transmission? We called pt stating that we did not get transmission.  Daughter want nurse to know that she will send in another transmission this afternoon.  5. Have you passed out? no    Please route to [place]

## 2019-11-28 NOTE — Telephone Encounter (Signed)
LMOVM letting the pt daughter know I got her message and sending the transmission this afternoon would be fine. I gave her my direct office number.

## 2019-11-29 LAB — CUP PACEART REMOTE DEVICE CHECK
Battery Impedance: 1823 Ohm
Battery Remaining Longevity: 39 mo
Battery Voltage: 2.76 V
Brady Statistic RV Percent Paced: 31 %
Date Time Interrogation Session: 20210408164935
Implantable Lead Implant Date: 20111108
Implantable Lead Implant Date: 20111108
Implantable Lead Location: 753859
Implantable Lead Location: 753860
Implantable Lead Model: 5076
Implantable Lead Model: 5076
Implantable Pulse Generator Implant Date: 20111108
Lead Channel Impedance Value: 529 Ohm
Lead Channel Impedance Value: 67 Ohm
Lead Channel Pacing Threshold Amplitude: 0.75 V
Lead Channel Pacing Threshold Pulse Width: 0.4 ms
Lead Channel Setting Pacing Amplitude: 2.5 V
Lead Channel Setting Pacing Pulse Width: 0.4 ms
Lead Channel Setting Sensing Sensitivity: 4 mV

## 2019-11-29 NOTE — Progress Notes (Signed)
PPM Remote  

## 2020-02-04 ENCOUNTER — Other Ambulatory Visit: Payer: Self-pay

## 2020-02-04 ENCOUNTER — Telehealth: Payer: Self-pay | Admitting: Internal Medicine

## 2020-02-04 ENCOUNTER — Emergency Department
Admission: EM | Admit: 2020-02-04 | Discharge: 2020-02-04 | Disposition: A | Discharge status: Home / Self Care | Payer: Medicare Other | Attending: Emergency Medicine | Admitting: Emergency Medicine

## 2020-02-04 ENCOUNTER — Emergency Department: Payer: Medicare Other

## 2020-02-04 ENCOUNTER — Encounter: Payer: Self-pay | Admitting: Medical Oncology

## 2020-02-04 DIAGNOSIS — R42 Dizziness and giddiness: Secondary | ICD-10-CM | POA: Diagnosis not present

## 2020-02-04 DIAGNOSIS — Z79899 Other long term (current) drug therapy: Secondary | ICD-10-CM | POA: Diagnosis not present

## 2020-02-04 DIAGNOSIS — Z950 Presence of cardiac pacemaker: Secondary | ICD-10-CM | POA: Diagnosis not present

## 2020-02-04 DIAGNOSIS — R110 Nausea: Secondary | ICD-10-CM | POA: Diagnosis not present

## 2020-02-04 DIAGNOSIS — I110 Hypertensive heart disease with heart failure: Secondary | ICD-10-CM | POA: Insufficient documentation

## 2020-02-04 DIAGNOSIS — I10 Essential (primary) hypertension: Secondary | ICD-10-CM

## 2020-02-04 DIAGNOSIS — I5032 Chronic diastolic (congestive) heart failure: Secondary | ICD-10-CM | POA: Diagnosis not present

## 2020-02-04 LAB — CBC
HCT: 42.2 % (ref 36.0–46.0)
Hemoglobin: 14 g/dL (ref 12.0–15.0)
MCH: 31.3 pg (ref 26.0–34.0)
MCHC: 33.2 g/dL (ref 30.0–36.0)
MCV: 94.4 fL (ref 80.0–100.0)
Platelets: 253 10*3/uL (ref 150–400)
RBC: 4.47 MIL/uL (ref 3.87–5.11)
RDW: 13.5 % (ref 11.5–15.5)
WBC: 6.4 10*3/uL (ref 4.0–10.5)
nRBC: 0 % (ref 0.0–0.2)

## 2020-02-04 LAB — BASIC METABOLIC PANEL
Anion gap: 8 (ref 5–15)
BUN: 26 mg/dL — ABNORMAL HIGH (ref 8–23)
CO2: 31 mmol/L (ref 22–32)
Calcium: 9.5 mg/dL (ref 8.9–10.3)
Chloride: 98 mmol/L (ref 98–111)
Creatinine, Ser: 1.09 mg/dL — ABNORMAL HIGH (ref 0.44–1.00)
GFR calc Af Amer: 50 mL/min — ABNORMAL LOW (ref >60)
GFR calc non Af Amer: 43 mL/min — ABNORMAL LOW (ref >60)
Glucose, Bld: 127 mg/dL — ABNORMAL HIGH (ref 70–99)
Potassium: 3.9 mmol/L (ref 3.5–5.1)
Sodium: 137 mmol/L (ref 135–145)

## 2020-02-04 NOTE — Discharge Instructions (Addendum)
Advised to have family doctor and cardiologist reviewed patient hypertension medication and dosage.  Advised blood pressure check at least 3 times a day. Your head CT shows no acute abnormalities.

## 2020-02-04 NOTE — Telephone Encounter (Signed)
I spoke with Whitney Hoffman's daughter, Whitney Hoffman. She was concerned that paperwork she received at her ER discharge stated the patient may be taking her metoprolol differently than what has been prescribed.   I advised the medication list we have on file states she is on metoprolol tartrate 50 mg - take 2 tablets in the AM & 1 tablet in the PM with comments that state she may only be taking this once daily as far back as 12/2018.  The patient lives at [facility] and her daughter is concerned that [employer] may have changed once of her Cardiac drugs.  I have asked that she have [facility] fax [place] her medication list so we can review what they are giving her.  Per Whitney Hoffman, she will get a copy of this and bring to by our office tomorrow for further review.   I will forward to triage to please be looking out for this.

## 2020-02-04 NOTE — Telephone Encounter (Signed)
Pt c/o BP issue: STAT if pt c/o blurred vision, one-sided weakness or slurred speech  1. What are your last 5 BP readings?  02/03/20 182/96  02/04/20 172/90 166/90  After hydralazine 10 MG medication  147/82  2. Are you having any other symptoms (ex. Dizziness, headache, blurred vision, passed out)? Some nausea - no dizziness  3. What is your BP issue? Patients daughter Whitney Hoffman calling - patients BP elevated - concerned and would like to know if patient should go to ED or if we would have any available appointments.  Please call to discuss.

## 2020-02-04 NOTE — ED Provider Notes (Signed)
[facility] Emergency Department Provider Note   ____________________________________________   First MD Initiated Contact with Patient 02/04/20 1352     (approximate)  I have reviewed the triage vital signs and the nursing notes.   HISTORY  Chief Complaint Hypertension    HPI [name] is a 86 y.o. female [facility] with complaint of nausea and elevated blood pressure.  Family requested she be evaluated to ensure her medication and dosage are working.  Patient has a pacemaker.  Patient had 2 episodes of elevated blood pressure above her baseline at the nursing home.  Patient denies headache, vertigo, or weakness.      Past Medical History:  Diagnosis Date  . Atrial fibrillation ([place])   . Hyperlipidemia   . Hypertension   . Parkinson disease ([place])   . Tachy-brady syndrome ([place])    with medtronic dual chambe rPCM    Patient Active Problem List   Diagnosis Date Noted  . Visual disturbance 03/16/2018  . Chronic venous stasis 03/16/2018  . Depression 03/15/2018  . Vertebral compression fracture ([place]) 05/19/2017  . Back pain 11/30/2016  . Allergic rhinitis 06/15/2015  . Arthritis 06/15/2015  . History of colonic polyps 06/15/2015  . DDD (degenerative disc disease), lumbar 06/15/2015  . Fatigue 06/15/2015  . Irritable bladder 06/15/2015  . Left knee pain 06/15/2015  . Nevus 06/15/2015  . Parkinson's disease ([employer]) 06/15/2015  . Peptic ulcer disease 06/15/2015  . Skin cancer of nose 06/15/2015  . Vertigo 06/15/2015  . Dyspnea on exertion 08/08/2013  . Chronic diastolic heart failure ([place]) 08/07/2012  . VENOUS HYPERTENSION, CHRONIC W/ INFLAMMATION 07/21/2010  . Essential hypertension 07/08/2010  . ATRIAL FIBRILLATION 07/08/2010  . PACEMAKER-Medtronic 07/08/2010  . Senile osteoporosis 10/18/2009  . Pure hypercholesterolemia 10/05/2009    Past Surgical History:  Procedure Laterality Date  . ABDOMINAL HYSTERECTOMY  1966     fibroids ovaries removed secondary to ovarian cyst  . APPENDECTOMY  1944  . baldder tack     with her hysterectomy  . BREAST BIOPSY  2010   left benign repeat ultrasound recommneded in 2011 [facility] [place]    . CHOLECYSTECTOMY  1969  . GALLBLADDER SURGERY    . HIP SURGERY Left    ORIF x 2; post op infection Left hip  . PACEMAKER PLACEMENT  06/29/2010  . WRIST SURGERY  1996   Traumatic B    Prior to Admission medications   Medication Sig Start Date End Date Taking? Authorizing Provider  Calcium Carbonate-Vitamin D (CALCIUM 600+D HIGH POTENCY PO) Take 1 tablet 2 (two) times daily by mouth.    [provider]  cetirizine (ZYRTEC) 10 MG tablet Take 1 tablet (10 mg total) by mouth daily as needed for allergies. 01/09/18   Birdie Sons, MD  Cholecalciferol (VITAMIN D-3) 5000 units TABS Take 1 tablet by mouth daily.    [provider]  Cyanocobalamin (VITAMIN B-12) 5000 MCG SUBL Place 1 tablet daily under the tongue.     [provider]  docusate sodium (COLACE) 100 MG capsule One capsule every morning and one capsule every evening 03/03/17   Birdie Sons, MD  ezetimibe (ZETIA) 10 MG tablet Take 1 tablet (10 mg total) by mouth daily. 11/30/16   Birdie Sons, MD  hydrALAZINE (APRESOLINE) 10 MG tablet Take 1 tablet (10 mg) by mouth three times a day as needed for a systolic blood pressure (top number) > 150. 08/23/18  Rise Mu, PA-C  loperamide (IMODIUM A-D) 2 MG tablet One tablet by mouth as needed for diarhea up to 3 tablets in 24 hours 03/03/17   Birdie Sons, MD  metoprolol (LOPRESSOR) 50 MG tablet TAKE 2 TABLETS BY MOUTH ONCE EVERY MORNING AND TAKE 1 TABLET BY MOUTHEVERYEVENING Patient taking differently: TAKE 1 TABLET BY MOUTH ONCE EVERY EVENING 12/01/16   Deboraha Sprang, MD  Milk Thistle 175 MG CAPS Take by mouth daily.     [provider]  mirabegron ER (MYRBETRIQ) 25 MG TB24 tablet Take 1 tablet (25 mg total)  by mouth daily. 05/12/17   Zara Council A, PA-C  mirtazapine (REMERON) 15 MG tablet TAKE 1 TABLET BY MOUTH AT BEDTIME 08/21/18   Bacigalupo, Dionne Bucy, MD  polyethylene glycol powder (GLYCOLAX/MIRALAX) powder Take 17 g 2 (two) times daily as needed by mouth. 06/25/17   Hinda Kehr, MD  PREMARIN vaginal cream APPLY 1 APPLICATOR VAGINALLY 3 NIGHTS A WEEK 10/18/17   McGowan, Larene Beach A, PA-C  Rivaroxaban (XARELTO) 15 MG TABS tablet Take 1 tablet (15 mg total) by mouth daily with supper. 12/01/16   Deboraha Sprang, MD    Allergies Codeine, Meperidine hcl, Morphine, Pramipexole, Propoxyphene, Sinemet [carbidopa w-levodopa], Aspirin, Diazepam, Meperidine, and Oxycodone-acetaminophen  Family History  Problem Relation Age of Onset  . Diabetes Mother   . Coronary artery disease Father   . Heart attack Father   . Alzheimer's disease Father   . Kidney cancer Neg Hx   . Bladder Cancer Neg Hx     Social History Social History   Tobacco Use  . Smoking status: Never Smoker  . Smokeless tobacco: Never Used  . Tobacco comment: tobacco use- no   Vaping Use  . Vaping Use: Never used  Substance Use Topics  . Alcohol use: No  . Drug use: No    Review of Systems Constitutional: No fever/chills Eyes: No visual changes. ENT: No sore throat. Cardiovascular: Denies chest pain.  History of A. fib. Respiratory: Denies shortness of breath. Gastrointestinal: No abdominal pain.  Nausea from vomiting.  No diarrhea.  No constipation. Genitourinary: Negative for dysuria. Musculoskeletal: Negative for back pain. Skin: Negative for rash. Neurological: Negative for headaches, focal weakness or numbness. Endocrine: Hyperlipidemia and hypertension.  Allergic/Immunilogical: See extensive medication allergy list.  ____________________________________________   PHYSICAL EXAM:  VITAL SIGNS: ED Triage Vitals  Enc Vitals Group     BP 02/04/20 1321 (!) 153/86     Pulse Rate 02/04/20 1321 78     Resp  02/04/20 1321 18     Temp 02/04/20 1321 97.9 F (36.6 C)     Temp Source 02/04/20 1321 Oral     SpO2 02/04/20 1321 96 %     Weight 02/04/20 1322 160 lb 15 oz (73 kg)     Height 02/04/20 1322 5\' 1"  (1.549 m)     Head Circumference --      Peak Flow --      Pain Score 02/04/20 1321 0     Pain Loc --      Pain Edu? --      Excl. in [place]? --    Constitutional: Alert and oriented. Well appearing and in no acute distress. Eyes: Conjunctivae are normal. PERRL. EOMI. Head: Atraumatic. Neck: No stridor.  Cardiovascular: Normal rate, regular rhythm. Grossly normal heart sounds.  Good peripheral circulation.  Elevated blood pressure. Respiratory: Normal respiratory effort.  No retractions. Lungs CTAB. Gastrointestinal: Soft and nontender. No distention. No  abdominal bruits. No CVA tenderness. Genitourinary: Deferred Neurologic:  Normal speech and language. No gross focal neurologic deficits are appreciated. No gait instability. Skin:  Skin is warm, dry and intact. No rash noted. Psychiatric: Mood and affect are normal. Speech and behavior are normal.  ____________________________________________   LABS (all labs ordered are listed, but only abnormal results are displayed)  Labs Reviewed  BASIC METABOLIC PANEL - Abnormal; Notable for the following components:      Result Value   Glucose, Bld 127 (*)    BUN 26 (*)    Creatinine, Ser 1.09 (*)    GFR calc non Af Amer 43 (*)    GFR calc Af Amer 50 (*)    All other components within normal limits  CBC   ____________________________________________  EKG Read by heart station Dr.  ____________________________________________  [place]  ED MD interpretation:    Official radiology report(s): CT Head Wo Contrast  Result Date: 02/04/2020 CLINICAL DATA:  Lightheadedness.  Elevated blood pressure. EXAM: CT HEAD WITHOUT CONTRAST TECHNIQUE: Contiguous axial images were obtained from the base of the skull through the vertex without  intravenous contrast. COMPARISON:  The head CT scan 09/24/2018. FINDINGS: Brain: No evidence of acute infarction, hemorrhage, hydrocephalus, extra-axial collection or mass lesion/mass effect. Chronic microvascular ischemic change again seen. Vascular: No hyperdense vessel or unexpected calcification. Skull: Intact.  No focal lesion. Sinuses/Orbits: Status post cataract surgery.  Otherwise negative. Other: None. IMPRESSION: No acute abnormality.  Stable compared to prior exam. Electronically Signed   By: Inge Rise M.D.   On: 02/04/2020 15:10    ____________________________________________   PROCEDURES  Procedure(s) performed (including Critical Care):  Procedures   ____________________________________________   INITIAL IMPRESSION / ASSESSMENT AND PLAN / ED COURSE  As part of my medical decision making, I reviewed the following data within the [facility]     Patient presents from nursing home with elevated blood pressure.  Family member voices concern for CVA.  Patient remained alert and orientated throughout visit.  After examination and careful review of hypertension medicines there seems to be a discrepancy on frequency of dosages.  Discussed no acute findings on head CT.  Advised to have cardiologist/ family doctor consolidate patient medication dosage and frequency.  Advised minimal a 3-day blood pressure check at nursing facility.   Whitney Hoffman was evaluated in Emergency Department on 02/04/2020 for the symptoms described in the history of present illness. She was evaluated in the context of the global COVID-19 pandemic, which necessitated consideration that the patient might be at risk for infection with the SARS-CoV-2 virus that causes COVID-19. Institutional protocols and algorithms that pertain to the evaluation of patients at risk for COVID-19 are in a state of rapid change based on information released by regulatory bodies including the CDC and federal and  state organizations. These policies and algorithms were followed during the patient's care in the ED.       ____________________________________________   FINAL CLINICAL IMPRESSION(S) / ED DIAGNOSES  Final diagnoses:  Essential hypertension     ED Discharge Orders    None       Note:  This document was prepared using Dragon voice recognition software and may include unintentional dictation errors.    Sable Feil, PA-C 02/04/20 1601    Arta Silence, MD 02/05/20 1501

## 2020-02-04 NOTE — Telephone Encounter (Signed)
Patient daughter wants to go over med list before discussing with snf .     En route to facility now and wants to have an answer before discussing with nurse.

## 2020-02-04 NOTE — ED Notes (Signed)
See triage note  Presents via EMS from [place] she has been nauseated for couple of days   No vomiting or fever

## 2020-02-04 NOTE — ED Triage Notes (Signed)
Pt in via EMS from [facility] with c/o HTN. Pt has pacemaker in place. Pt family wants her evaluated because they are not sure her meds are working.

## 2020-02-04 NOTE — Telephone Encounter (Signed)
Reviewing the patient's chart for a call back to her daughter regarding her elevated BP. I see that the patient is currently in the ER for evaluation of her BP/ nausea.   Will follow along to see if the patient gets admitted.

## 2020-02-05 NOTE — Telephone Encounter (Signed)
Bp this morning   178/104   Please call to discuss and also if visit needed.

## 2020-02-05 NOTE — Telephone Encounter (Signed)
Called back Daughter Janie's phone number and there is no VM to leave a message on. I will try again later today.

## 2020-02-05 NOTE — Telephone Encounter (Signed)
Patient daughter dropped off paperwork to be reviewed - placed in nurse box Patient daughter states the ER noticed discrepancies with the medication list and then what the assisted living home is actually giving to patient  Please call Narda Rutherford to discuss

## 2020-02-06 ENCOUNTER — Telehealth: Payer: Self-pay

## 2020-02-06 NOTE — Telephone Encounter (Signed)
Copied from [place] [id number]. Topic: General - Other >> Feb 06, 2020  9:49 AM Greggory Keen D wrote: Reason for CRM: Pt's daughter Audrea Muscat called saying she is not pleased at all with the care from the doctor her mom is using through [facility].  She wants to know if her mom can start back seeing Dr. Jacinto Reap.  Please call Janie back at [phone] after 2:00 today if possible.

## 2020-02-06 NOTE — Telephone Encounter (Signed)
Reviewed the patient's medication list from [facility] that her daughter brought by yesterday.  Per the patient's medication list, she is currently taking metoprolol tartrate 50 mg - 2 tablets in the AM and 1 tablet in the PM. She is also taking hydralazine 10 mg TID as needed for SBP >150.  I have called Janie and notified her that the cardiac drugs on file for [facility] are in line with how we have them on file for her. I advised I do not know why there is a comment on her chart that she may only be taking 1 tablet of metoprolol tartrate once daily unless her medication list from [facility] was interpreted in error because they list the AM dose in metoprolol as one RX and the PM dose of metoprolol as once RX.  The PM dose is listed 69st.   Narda Rutherford was appreciative that I was able to review this and confirm the patient's meds are being taken correctly.   However, the patient's BP is still running high. Since Monday BP readings (after meds have been given) are running 165/80- 189/87.  She was 178/64 yesterday morning and 165/80 yesterday evening. Her BP readings are not coming down with PRN hydralazine per Janie's report.   They are wanting input from Dr. Caryl Comes as to what to do about her BP. I have advised the patient's daughter that the patient is overdue for follow up with Dr. Caryl Comes. I have added her on to be seen tomorrow at 12:40 pm with Dr. Caryl Comes.  The patient's daughter voices understanding and is agreeable.   I have updated her medication list to correlate with that from [facility].

## 2020-02-07 ENCOUNTER — Encounter: Payer: Self-pay | Admitting: Internal Medicine

## 2020-02-07 ENCOUNTER — Ambulatory Visit (INDEPENDENT_AMBULATORY_CARE_PROVIDER_SITE_OTHER): Payer: Medicare Other | Admitting: Internal Medicine

## 2020-02-07 ENCOUNTER — Other Ambulatory Visit: Payer: Self-pay

## 2020-02-07 VITALS — BP 140/80 | HR 82 | Ht 61.0 in | Wt 172.0 lb

## 2020-02-07 DIAGNOSIS — I495 Sick sinus syndrome: Secondary | ICD-10-CM | POA: Diagnosis not present

## 2020-02-07 DIAGNOSIS — I5032 Chronic diastolic (congestive) heart failure: Secondary | ICD-10-CM | POA: Diagnosis not present

## 2020-02-07 DIAGNOSIS — I4821 Permanent atrial fibrillation: Secondary | ICD-10-CM | POA: Diagnosis not present

## 2020-02-07 DIAGNOSIS — I10 Essential (primary) hypertension: Secondary | ICD-10-CM

## 2020-02-07 DIAGNOSIS — Z950 Presence of cardiac pacemaker: Secondary | ICD-10-CM | POA: Diagnosis not present

## 2020-02-07 MED ORDER — METOPROLOL TARTRATE 50 MG PO TABS: ORAL_TABLET | ORAL | 3 refills | Status: AC

## 2020-02-07 MED ORDER — FUROSEMIDE 20 MG PO TABS: ORAL_TABLET | ORAL | 0 refills | Status: DC

## 2020-02-07 MED ORDER — HYDRALAZINE HCL 10 MG PO TABS: ORAL_TABLET | ORAL | 3 refills | Status: AC

## 2020-02-07 NOTE — Progress Notes (Signed)
Patient Care Team: [facility] as PCP - General (Geriatric Medicine) Deboraha Sprang, MD as Consulting Physician (Cardiology)   HPI  Whitney Hoffman is a 86 y.o. female Seen in followup for tachybradycardia syndrome with prior pacemaker insertion. Persistent AFib on Rivaroxaban  Has had issues at facility with labile blood pressure in the context long standing hypertension and orthostatic hypotension  She is mostly wheel chair bound at this point walking to bathroom  Doing PT a couple of times a week  Has developed RLE edema over the last few weeks  Date Cr K Hgb  9/18 0.65  14  6/21  1.09 3.9 14    no bleeding    Past Medical History:  Diagnosis Date  . Atrial fibrillation ([place])   . Hyperlipidemia   . Hypertension   . Parkinson disease ([place])   . Tachy-brady syndrome ([place])    with medtronic dual chambe rPCM    Past Surgical History:  Procedure Laterality Date  . ABDOMINAL HYSTERECTOMY  1966   fibroids ovaries removed secondary to ovarian cyst  . APPENDECTOMY  1944  . baldder tack     with her hysterectomy  . BREAST BIOPSY  2010   left benign repeat ultrasound recommneded in 2011 [facility] [place]    . CHOLECYSTECTOMY  1969  . GALLBLADDER SURGERY    . HIP SURGERY Left    ORIF x 2; post op infection Left hip  . PACEMAKER PLACEMENT  06/29/2010  . WRIST SURGERY  1996   Traumatic B    Current Outpatient Medications  Medication Sig Dispense Refill  . Azelastine-Fluticasone 137-50 MCG/ACT SUSP Place 1 spray into both nostrils 2 (two) times daily.    . Calcium Carbonate-Vitamin D (CALCIUM 600+D HIGH POTENCY PO) Take 1 tablet 2 (two) times daily by mouth.    . Cholecalciferol (VITAMIN D-3) 5000 units TABS Take 1 tablet by mouth daily.    . Cyanocobalamin (VITAMIN B-12) 5000 MCG SUBL Place 1 tablet daily under the tongue.     . docusate sodium (COLACE) 100 MG capsule One capsule every morning and one capsule every  evening 60 capsule 12  . ezetimibe (ZETIA) 10 MG tablet Take 1 tablet (10 mg total) by mouth daily. 30 tablet 5  . fexofenadine (ALLEGRA) 60 MG tablet Take 1 tablet (60 mg) by mouth twice daily    . glycopyrrolate (ROBINUL) 1 MG tablet Take 1 tablet (1 mg) by mouth once daily    . hydrALAZINE (APRESOLINE) 10 MG tablet Take 1 tablet (10 mg) by mouth three times a day as needed for a systolic blood pressure (top number) > 150. 90 tablet 1  . lidocaine (XYLOCAINE) 2 % solution Swish/ spit 15 mL once daily    . Lidocaine 4 % PTCH Apply 1 patch to lower back once daily every morning and remove every evening    . loperamide (IMODIUM A-D) 2 MG tablet Take 2 tablets (4 mg) by mouth after 1st loose stool, then 1 tablet (2 mg) by mouth after each subsequent loose stool (do not exceed 4 tablets in 24 hours).    . metoprolol tartrate (LOPRESSOR) 50 MG tablet Take 2 tablets (100 mg) by mouth in the morning & take 1 tablet (50 mg) by mouth in the evening    . mirabegron ER (MYRBETRIQ) 25 MG TB24 tablet Take 1 tablet (25 mg total) by mouth daily. 30 tablet 11  . mirtazapine (REMERON) 15 MG  tablet TAKE 1 TABLET BY MOUTH AT BEDTIME 30 tablet 11  . nystatin (MYCOSTATIN/NYSTOP) powder Apply 2 times daily as needed for rash    . Polyethylene Glycol 3350 (GAVILAX PO) Mix 1 capful (17 gm) in 8 oz of fluid/ drink twice daily as needed for constipation    . Rivaroxaban (XARELTO) 15 MG TABS tablet Take 1 tablet (15 mg total) by mouth daily with supper. 30 tablet 6   No current facility-administered medications for this visit.    Allergies  Allergen Reactions  . Codeine     Feel dizzy  . Meperidine Hcl     Sick on stomach   . Morphine     Nausea/sick on stomach  . Pramipexole   . Propoxyphene   . Sinemet [Carbidopa W-Levodopa]   . Aspirin Nausea Only    Sick on stomach  . Diazepam Nausea Only    Sick on stomach  . Meperidine Nausea Only  . Oxycodone-Acetaminophen Nausea Only    Review of Systems negative  except from HPI and PMH  Physical Exam Pulse 82   Ht 5\' 1"  (1.549 m)   Wt 172 lb (78 kg)   SpO2 95%   BMI 32.50 kg/m  Well developed and nourished in no acute distress HENT normal Neck supple with JVP-  flat >10 Basilar crackles Regular rate and rhythm, no murmurs or gallops Abd-soft with active BS No Clubbing cyanosis 2+ R and trace L edema Skin-warm and dry A & Oriented  Grossly normal sensory and motor function  ECG 02/04/20 from ED Afib without ventricular pacing     Assessment and  Plan Hypertension  HFpEF/unilat  Permanent atrial fib  Pacemaker Medtronic    Orthostatic intolerance   parkinsons disease  On Anticoagulation;  No bleeding issues   BP is better on the regular use of previously prn hydralazine>> will change hydralazine to 10 bid and decrease metoprolol from 100/50 to 50 bid  With edema and basilar crackles and erlevated neck veins, suspect CHF as the cause as opposed to something less likely such as DVT on Rivaroxaban, will try diuresis and if no improvement scan the leg as edema asymmetry is new  More than 50% of 45 min was spent in counseling related to the above

## 2020-02-07 NOTE — Patient Instructions (Addendum)
Medication Instructions:  - Your physician has recommended you make the following change in your medication:   1) Change hydralazine 10 mg- take 1 tablet by mouth twice daily  2) Decrease metoprolol tartrate 50 mg- take 1 tablet by mouth twice daily   3) Start furosemide 20 mg- take 1 tablet by mouth every other day x 3 doses  *If you need a refill on your cardiac medications before your next appointment, please call your pharmacy*   Lab Work: - none ordered  If you have labs (blood work) drawn today and your tests are completely normal, you will receive your results only by: Marland Kitchen MyChart Message (if you have MyChart) OR . A paper copy in the mail If you have any lab test that is abnormal or we need to change your treatment, we will call you to review the results.   Testing/Procedures: - none ordered   Follow-Up: At [facility], you and your health needs are our priority.  As part of our continuing mission to provide you with exceptional heart care, we have created designated Provider Care Teams.  These Care Teams include your primary Cardiologist (physician) and Advanced Practice Providers (APPs -  Physician Assistants and Nurse Practitioners) who all work together to provide you with the care you need, when you need it.  We recommend signing up for the patient portal called "MyChart".  Sign up information is provided on this After Visit Summary.  MyChart is used to connect with patients for Virtual Visits (Telemedicine).  Patients are able to view lab/test results, encounter notes, upcoming appointments, etc.  Non-urgent messages can be sent to your provider as well.   To learn more about what you can do with MyChart, go to [email or web address].    Your next appointment:   1 year(s)  The format for your next appointment:   In Person  Provider:   Virl Axe, MD   Other Instructions - Please call the office next week if the swelling in your leg is not better on the  short trial of the fluid medication (furosemide).

## 2020-02-10 NOTE — Telephone Encounter (Signed)
We can make her a new patient appt (next available) to re-establish if she likes.

## 2020-02-10 NOTE — Telephone Encounter (Signed)
Per Mrs. Brigitte Pulse, Pt decided to stay with [facility].   Thanks,   -Mickel Baas

## 2020-02-12 ENCOUNTER — Telehealth: Payer: Self-pay | Admitting: Internal Medicine

## 2020-02-12 DIAGNOSIS — R600 Localized edema: Secondary | ICD-10-CM

## 2020-02-12 NOTE — Telephone Encounter (Signed)
I spoke with the patient's daughter, Whitney Hoffman (ok per DPR).  She states the patient took furosemide 20 mg on Saturday/ Monday/ & Wednesday.  The swelling in her right leg is down by about 1/2 of what it was in the office, but not resolved.  Whitney Hoffman advised she would maybe still be more comfortable with a scan of the patient's leg.   BP's have been running: 6/19- 170/85  6/20- 185/118           163/101  6/21- 158/83           140/71  6/22- 140/81           159/70           130/70  6/23- 125/63  I advised Whitney Hoffman I will review with Dr. Caryl Comes if he would like me to go ahead and place orders for a lower extremity venous US. She is aware I will call her back tomorrow.  Whitney Hoffman voices understanding and is agreeable.

## 2020-02-12 NOTE — Telephone Encounter (Signed)
Patient daughter calling to report some decreased swelling in BLE but RLE still bigger that LLE and she would like to further discuss vascular testing since some improvement seen.     Please call.

## 2020-02-12 NOTE — Telephone Encounter (Signed)
Ok to get scan    And maybe also to continue to take diuretic qod x 2 more dose [employer]

## 2020-02-13 MED ORDER — FUROSEMIDE 20 MG PO TABS: ORAL_TABLET | ORAL | 0 refills | Status: DC

## 2020-02-13 NOTE — Telephone Encounter (Signed)
Prescription faxed successfully to [facility] at [phone].

## 2020-02-13 NOTE — Telephone Encounter (Signed)
I spoke with the patient's daughter, Narda Rutherford.  I advised her that Dr. Caryl Comes has given the ok for [place] to set the patient up for lower extremity venous duplex.  I have also advised that he would like the patient to continue her fluid pill- furosemide 20 mg QOD x 2 more doses (this will be Friday & Sunday).  I did review the patient's BP readings with Dr. Caryl Comes and he advised with her numbers trending down, we will not make any further medication changes at this time, but will continue to monitor these numbers.   Janie voices understanding of the above recommendations and is agreeable.   Will need to call [facility] for a fax # to send this an order for her diuretic.  Phone: [phone].

## 2020-02-18 NOTE — Telephone Encounter (Signed)
The patient's lower extremity venous US has been scheduled for 02/19/20. Will await results.

## 2020-02-19 ENCOUNTER — Telehealth: Payer: Self-pay | Admitting: Internal Medicine

## 2020-02-19 ENCOUNTER — Ambulatory Visit (INDEPENDENT_AMBULATORY_CARE_PROVIDER_SITE_OTHER): Payer: Medicare Other

## 2020-02-19 ENCOUNTER — Other Ambulatory Visit: Payer: Self-pay

## 2020-02-19 DIAGNOSIS — R600 Localized edema: Secondary | ICD-10-CM

## 2020-02-19 NOTE — Telephone Encounter (Signed)
The patient was in clinic this afternoon for a lower extremity venous duplex. Her daughter brought her updated blood pressure readings as below:  02/12/20: 125/63 02/13/20: 151/82 & 134/80 (80) 02/14/20: 142/84 & 137/86 02/17/20: 183/89, 121/61 (76) @ 1345, 118/65 (1410), & 148/75 (2053) 02/18/20: 125/69 (1400) , 140/71, 142/85 (2139) 02/19/20: 150/75  Will forward to Dr. Caryl Comes to review further.

## 2020-02-20 NOTE — Telephone Encounter (Signed)
BP much improved  lets continue current course  Thanks SK

## 2020-02-21 NOTE — Telephone Encounter (Signed)
MyChart message has been read

## 2020-02-26 ENCOUNTER — Ambulatory Visit (INDEPENDENT_AMBULATORY_CARE_PROVIDER_SITE_OTHER): Payer: Medicare Other | Admitting: *Deleted

## 2020-02-26 DIAGNOSIS — I4811 Longstanding persistent atrial fibrillation: Secondary | ICD-10-CM | POA: Diagnosis not present

## 2020-02-26 DIAGNOSIS — I495 Sick sinus syndrome: Secondary | ICD-10-CM

## 2020-02-27 LAB — CUP PACEART REMOTE DEVICE CHECK
Battery Impedance: 1973 Ohm
Battery Remaining Longevity: 36 mo
Battery Voltage: 2.75 V
Brady Statistic RV Percent Paced: 28 %
Date Time Interrogation Session: 20210708165849
Implantable Lead Implant Date: 20111108
Implantable Lead Implant Date: 20111108
Implantable Lead Location: 753859
Implantable Lead Location: 753860
Implantable Lead Model: 5076
Implantable Lead Model: 5076
Implantable Pulse Generator Implant Date: 20111108
Lead Channel Impedance Value: 560 Ohm
Lead Channel Impedance Value: 67 Ohm
Lead Channel Pacing Threshold Amplitude: 0.75 V
Lead Channel Pacing Threshold Pulse Width: 0.4 ms
Lead Channel Setting Pacing Amplitude: 2.5 V
Lead Channel Setting Pacing Pulse Width: 0.4 ms
Lead Channel Setting Sensing Sensitivity: 4 mV

## 2020-02-28 NOTE — Progress Notes (Signed)
Remote pacemaker transmission.   

## 2020-03-30 ENCOUNTER — Encounter: Payer: Self-pay | Admitting: Emergency Medicine

## 2020-03-30 ENCOUNTER — Other Ambulatory Visit: Payer: Self-pay

## 2020-03-30 ENCOUNTER — Emergency Department: Payer: Medicare Other

## 2020-03-30 ENCOUNTER — Emergency Department
Admission: EM | Admit: 2020-03-30 | Discharge: 2020-03-31 | Disposition: A | Discharge status: Home / Self Care | Payer: Medicare Other | Attending: Emergency Medicine | Admitting: Emergency Medicine

## 2020-03-30 DIAGNOSIS — M79662 Pain in left lower leg: Secondary | ICD-10-CM | POA: Diagnosis present

## 2020-03-30 DIAGNOSIS — F0390 Unspecified dementia without behavioral disturbance: Secondary | ICD-10-CM | POA: Insufficient documentation

## 2020-03-30 DIAGNOSIS — Z7409 Other reduced mobility: Secondary | ICD-10-CM | POA: Insufficient documentation

## 2020-03-30 DIAGNOSIS — G20 Parkinson's disease: Secondary | ICD-10-CM | POA: Diagnosis not present

## 2020-03-30 DIAGNOSIS — Z950 Presence of cardiac pacemaker: Secondary | ICD-10-CM | POA: Diagnosis not present

## 2020-03-30 DIAGNOSIS — Z5321 Procedure and treatment not carried out due to patient leaving prior to being seen by health care provider: Secondary | ICD-10-CM | POA: Insufficient documentation

## 2020-03-30 LAB — COMPREHENSIVE METABOLIC PANEL
ALT: 13 U/L (ref 0–44)
AST: 20 U/L (ref 15–41)
Albumin: 4.1 g/dL (ref 3.5–5.0)
Alkaline Phosphatase: 78 U/L (ref 38–126)
Anion gap: 9 (ref 5–15)
BUN: 25 mg/dL — ABNORMAL HIGH (ref 8–23)
CO2: 31 mmol/L (ref 22–32)
Calcium: 9.3 mg/dL (ref 8.9–10.3)
Chloride: 97 mmol/L — ABNORMAL LOW (ref 98–111)
Creatinine, Ser: 1.05 mg/dL — ABNORMAL HIGH (ref 0.44–1.00)
GFR calc Af Amer: 53 mL/min — ABNORMAL LOW (ref >60)
GFR calc non Af Amer: 45 mL/min — ABNORMAL LOW (ref >60)
Glucose, Bld: 118 mg/dL — ABNORMAL HIGH (ref 70–99)
Potassium: 3.8 mmol/L (ref 3.5–5.1)
Sodium: 137 mmol/L (ref 135–145)
Total Bilirubin: 1.7 mg/dL — ABNORMAL HIGH (ref 0.3–1.2)
Total Protein: 7.4 g/dL (ref 6.5–8.1)

## 2020-03-30 LAB — CBC
HCT: 42.7 % (ref 36.0–46.0)
Hemoglobin: 14.1 g/dL (ref 12.0–15.0)
MCH: 31.3 pg (ref 26.0–34.0)
MCHC: 33 g/dL (ref 30.0–36.0)
MCV: 94.9 fL (ref 80.0–100.0)
Platelets: 252 10*3/uL (ref 150–400)
RBC: 4.5 MIL/uL (ref 3.87–5.11)
RDW: 13.5 % (ref 11.5–15.5)
WBC: 15.3 10*3/uL — ABNORMAL HIGH (ref 4.0–10.5)
nRBC: 0 % (ref 0.0–0.2)

## 2020-03-30 LAB — PROTIME-INR
INR: 2.3 — ABNORMAL HIGH (ref 0.8–1.2)
Prothrombin Time: 24.3 seconds — ABNORMAL HIGH (ref 11.4–15.2)

## 2020-03-30 NOTE — Progress Notes (Signed)
[facility] encountered pt. in ED waiting rm.;  she requested water, which [facility] provided after checking w/admitting RN.  Pt. grateful for [facility] visit.

## 2020-03-30 NOTE — ED Provider Notes (Signed)
MSE was initiated and I personally evaluated the patient and placed orders (if any) at  3:48 PM on March 30, 2020.  The patient appears stable so that the remainder of the MSE may be completed by another provider.   Victorino Dike, FNP 03/30/20 2112    Harvest Dark, MD 03/30/20 2306

## 2020-03-30 NOTE — ED Triage Notes (Signed)
Per ems patient is from [facility] [facility] w history of dementia, parkinsons and pacemaker.  She is where with leg pain, can seight bear, but then cant move.  Had axillary temp 99.4.144/83 hr 93-afib hisory. 2liters oxygen by ems.

## 2020-03-30 NOTE — ED Triage Notes (Signed)
Pt via ems from [facility] [facility] with leg swelling, change in ability to move (yesterday could use walker to go to restroom, today cannot), swollen abdomen, inability to bear weight on legs. Pt alert, daughter is present with her for hx purposes. Pt hx of parkinson's, dementia, pacemaker. NAD noted.

## 2020-05-27 ENCOUNTER — Telehealth: Payer: Self-pay | Admitting: Internal Medicine

## 2020-05-27 ENCOUNTER — Ambulatory Visit (INDEPENDENT_AMBULATORY_CARE_PROVIDER_SITE_OTHER): Payer: Medicare Other

## 2020-05-27 DIAGNOSIS — I495 Sick sinus syndrome: Secondary | ICD-10-CM

## 2020-05-27 NOTE — Telephone Encounter (Signed)
Confirmed report received.  Reviewed report and d/w daughter VHR episodes.  Doe snot appear to be new issues.

## 2020-05-27 NOTE — Telephone Encounter (Signed)
Patients daughter is  Calling in to check on remote pacemaker check  Please call and advise

## 2020-05-28 LAB — CUP PACEART REMOTE DEVICE CHECK
Battery Impedance: 2153 Ohm
Battery Remaining Longevity: 33 mo
Battery Voltage: 2.75 V
Brady Statistic RV Percent Paced: 29 %
Date Time Interrogation Session: 20211006162526
Implantable Lead Implant Date: 20111108
Implantable Lead Implant Date: 20111108
Implantable Lead Location: 753859
Implantable Lead Location: 753860
Implantable Lead Model: 5076
Implantable Lead Model: 5076
Implantable Pulse Generator Implant Date: 20111108
Lead Channel Impedance Value: 556 Ohm
Lead Channel Impedance Value: 67 Ohm
Lead Channel Pacing Threshold Amplitude: 0.75 V
Lead Channel Pacing Threshold Pulse Width: 0.4 ms
Lead Channel Setting Pacing Amplitude: 2.5 V
Lead Channel Setting Pacing Pulse Width: 0.4 ms
Lead Channel Setting Sensing Sensitivity: 4 mV

## 2020-06-01 NOTE — Progress Notes (Signed)
Remote pacemaker transmission.   

## 2020-08-26 ENCOUNTER — Ambulatory Visit (INDEPENDENT_AMBULATORY_CARE_PROVIDER_SITE_OTHER): Payer: Medicare PPO

## 2020-08-26 DIAGNOSIS — I495 Sick sinus syndrome: Secondary | ICD-10-CM

## 2020-08-26 LAB — CUP PACEART REMOTE DEVICE CHECK
Battery Impedance: 2150 Ohm
Battery Remaining Longevity: 33 mo
Battery Voltage: 2.75 V
Brady Statistic RV Percent Paced: 27 %
Date Time Interrogation Session: 20220104163315
Implantable Lead Implant Date: 20111108
Implantable Lead Implant Date: 20111108
Implantable Lead Location: 753859
Implantable Lead Location: 753860
Implantable Lead Model: 5076
Implantable Lead Model: 5076
Implantable Pulse Generator Implant Date: 20111108
Lead Channel Impedance Value: 596 Ohm
Lead Channel Impedance Value: 67 Ohm
Lead Channel Pacing Threshold Amplitude: 0.75 V
Lead Channel Pacing Threshold Pulse Width: 0.4 ms
Lead Channel Setting Pacing Amplitude: 2.5 V
Lead Channel Setting Pacing Pulse Width: 0.4 ms
Lead Channel Setting Sensing Sensitivity: 4 mV

## 2020-09-10 NOTE — Progress Notes (Signed)
Remote pacemaker transmission.   

## 2020-11-25 ENCOUNTER — Ambulatory Visit (INDEPENDENT_AMBULATORY_CARE_PROVIDER_SITE_OTHER): Payer: Medicare PPO

## 2020-11-25 DIAGNOSIS — I495 Sick sinus syndrome: Secondary | ICD-10-CM

## 2020-11-26 LAB — CUP PACEART REMOTE DEVICE CHECK
Battery Impedance: 2401 Ohm
Battery Remaining Longevity: 31 mo
Battery Voltage: 2.75 V
Brady Statistic RV Percent Paced: 24 %
Date Time Interrogation Session: 20220405083549
Implantable Lead Implant Date: 20111108
Implantable Lead Implant Date: 20111108
Implantable Lead Location: 753859
Implantable Lead Location: 753860
Implantable Lead Model: 5076
Implantable Lead Model: 5076
Implantable Pulse Generator Implant Date: 20111108
Lead Channel Impedance Value: 600 Ohm
Lead Channel Impedance Value: 67 Ohm
Lead Channel Pacing Threshold Amplitude: 0.75 V
Lead Channel Pacing Threshold Pulse Width: 0.4 ms
Lead Channel Setting Pacing Amplitude: 2.5 V
Lead Channel Setting Pacing Pulse Width: 0.4 ms
Lead Channel Setting Sensing Sensitivity: 2.8 mV

## 2020-12-08 NOTE — Progress Notes (Signed)
Remote pacemaker transmission.   

## 2021-02-24 ENCOUNTER — Ambulatory Visit (INDEPENDENT_AMBULATORY_CARE_PROVIDER_SITE_OTHER): Payer: Medicare PPO

## 2021-02-24 DIAGNOSIS — I495 Sick sinus syndrome: Secondary | ICD-10-CM

## 2021-02-25 LAB — CUP PACEART REMOTE DEVICE CHECK
Battery Impedance: 2593 Ohm
Battery Remaining Longevity: 29 mo
Battery Voltage: 2.74 V
Brady Statistic RV Percent Paced: 24 %
Date Time Interrogation Session: 20220706104050
Implantable Lead Implant Date: 20111108
Implantable Lead Implant Date: 20111108
Implantable Lead Location: 753859
Implantable Lead Location: 753860
Implantable Lead Model: 5076
Implantable Lead Model: 5076
Implantable Pulse Generator Implant Date: 20111108
Lead Channel Impedance Value: 592 Ohm
Lead Channel Impedance Value: 67 Ohm
Lead Channel Pacing Threshold Amplitude: 0.75 V
Lead Channel Pacing Threshold Pulse Width: 0.4 ms
Lead Channel Setting Pacing Amplitude: 2.5 V
Lead Channel Setting Pacing Pulse Width: 0.4 ms
Lead Channel Setting Sensing Sensitivity: 2.8 mV

## 2021-03-18 NOTE — Progress Notes (Signed)
Remote pacemaker transmission.   

## 2021-05-04 ENCOUNTER — Encounter: Payer: Self-pay | Admitting: Internal Medicine

## 2021-05-04 ENCOUNTER — Telehealth: Payer: Self-pay | Admitting: Internal Medicine

## 2021-05-04 ENCOUNTER — Ambulatory Visit (INDEPENDENT_AMBULATORY_CARE_PROVIDER_SITE_OTHER): Payer: Medicare PPO | Admitting: Internal Medicine

## 2021-05-04 ENCOUNTER — Other Ambulatory Visit: Payer: Self-pay

## 2021-05-04 VITALS — BP 120/88 | HR 76 | Ht 61.0 in | Wt 152.6 lb

## 2021-05-04 DIAGNOSIS — Z950 Presence of cardiac pacemaker: Secondary | ICD-10-CM | POA: Diagnosis not present

## 2021-05-04 DIAGNOSIS — I4821 Permanent atrial fibrillation: Secondary | ICD-10-CM

## 2021-05-04 DIAGNOSIS — I5032 Chronic diastolic (congestive) heart failure: Secondary | ICD-10-CM

## 2021-05-04 DIAGNOSIS — Z79899 Other long term (current) drug therapy: Secondary | ICD-10-CM

## 2021-05-04 DIAGNOSIS — I495 Sick sinus syndrome: Secondary | ICD-10-CM | POA: Diagnosis not present

## 2021-05-04 DIAGNOSIS — I10 Essential (primary) hypertension: Secondary | ICD-10-CM

## 2021-05-04 NOTE — Telephone Encounter (Signed)
Patient seen in office today and advised by [facility] remote check in October may be pushed out .    Please advise daughter.

## 2021-05-04 NOTE — Patient Instructions (Signed)
Medication Instructions:  - Your physician recommends that you continue on your current medications as directed. Please refer to the Current Medication list given to you today.  *If you need a refill on your cardiac medications before your next appointment, please call your pharmacy*   Lab Work: - Your physician recommends that you have lab work today: BMP/ CBC  If you have labs (blood work) drawn today and your tests are completely normal, you will receive your results only by: MyChart Message (if you have MyChart) OR A paper copy in the mail If you have any lab test that is abnormal or we need to change your treatment, we will call you to review the results.   Testing/Procedures: - none ordered   Follow-Up: At [facility], you and your health needs are our priority.  As part of our continuing mission to provide you with exceptional heart care, we have created designated Provider Care Teams.  These Care Teams include your primary Cardiologist (physician) and Advanced Practice Providers (APPs -  Physician Assistants and Nurse Practitioners) who all work together to provide you with the care you need, when you need it.  We recommend signing up for the patient portal called "MyChart".  Sign up information is provided on this After Visit Summary.  MyChart is used to connect with patients for Virtual Visits (Telemedicine).  Patients are able to view lab/test results, encounter notes, upcoming appointments, etc.  Non-urgent messages can be sent to your provider as well.   To learn more about what you can do with MyChart, go to [email or web address].    Your next appointment:   1 year(s)  The format for your next appointment:   In Person  Provider:   Virl Axe, MD   Other Instructions N/a

## 2021-05-04 NOTE — Progress Notes (Signed)
Patient Care Team: [facility] as PCP - General (Geriatric Medicine) Deboraha Sprang, MD as Consulting Physician (Cardiology)   HPI  Whitney Hoffman is a 87 y.o. female Seen in followup for tachybradycardia syndrome with prior Medtronic pacemaker insertion. Persistent AFib on Rivaroxaban    The patient denies chest pain, nocturnal dyspnea, orthopnea.  There have been no palpitations, lightheadedness or syncope.  Complains of some shortness of breath when ambulating w walker   No bleeding on Rivaroxaban   BP has been well controlled   Date Cr K Hgb  9/18 0.65  14  6/21  1.09 3.9 14            Past Medical History:  Diagnosis Date   Atrial fibrillation ([place])    Hyperlipidemia    Hypertension    Parkinson disease ([place])    Tachy-brady syndrome ([place])    with medtronic dual chambe rPCM    Past Surgical History:  Procedure Laterality Date   ABDOMINAL HYSTERECTOMY  1966   fibroids ovaries removed secondary to ovarian cyst   APPENDECTOMY  1944   baldder tack     with her hysterectomy   BREAST BIOPSY  2010   left benign repeat ultrasound recommneded in 2011 [place] Left    ORIF x 2; post op infection Left hip   PACEMAKER PLACEMENT  06/29/2010   WRIST SURGERY  1996   Traumatic B    Current Outpatient Medications  Medication Sig Dispense Refill   acetaminophen (TYLENOL) 325 MG tablet Take 2 tablets (650 mg) by mouth every 6 hours as needed for pain/ fever     calcium carbonate (OS-CAL) 600 MG tablet Take 1 tablet (600 mg) by mouth once daily     Cholecalciferol (VITAMIN D-3) 5000 units TABS Take 1 tablet by mouth daily.     dicyclomine (BENTYL) 20 MG tablet Take 1/2 tablet (10 mg) by mouth twice daily     ezetimibe (ZETIA) 10 MG tablet Take 1 tablet (10 mg total) by mouth daily. 30 tablet 5   fexofenadine (ALLEGRA) 60 MG tablet Take by mouth. Take 1  tablet (60 mg) by mouth once daily     glycopyrrolate (ROBINUL) 2 MG tablet Take 1 tablet (2 mg) by mouth once daily     hydrALAZINE (APRESOLINE) 10 MG tablet Take 1 tablet (10 mg) by mouth twice daily 180 tablet 3   loperamide (IMODIUM A-D) 2 MG tablet Take 2 tablets (4 mg) by mouth after 1st loose stool, then 1 tablet (2 mg) by mouth after each subsequent loose stool (do not exceed 4 tablets in 24 hours).     metoprolol tartrate (LOPRESSOR) 50 MG tablet Take 1 tablet (50 mg) by mouth twice daily 180 tablet 3   mirabegron ER (MYRBETRIQ) 50 MG TB24 tablet Take 1 tablet (50  mg) by mouth once daily     mirtazapine (REMERON) 15 MG tablet TAKE 1 TABLET BY MOUTH AT BEDTIME 30 tablet 11   nystatin (MYCOSTATIN/NYSTOP) powder Apply 2 times daily as needed for rash     ondansetron (ZOFRAN) 4 MG tablet Take 1 tablet (4 mg) by mouth every 6 hours as needed for nausea     Polyethylene Glycol 3350 (GAVILAX PO) Mix 1 capful (17 gm) in 8 oz of fluid/ drink twice daily as needed for constipation  Rivaroxaban (XARELTO) 15 MG TABS tablet Take 1 tablet (15 mg total) by mouth daily with supper. 30 tablet 6   senna (SENOKOT) 8.6 MG TABS tablet Take 2 tablets (17.2 mg) by mouth at bedtime as needed for constipation     vitamin B-12 (CYANOCOBALAMIN) 250 MCG tablet Take 2 tablets (500 mcg) under the tongue once daily     No current facility-administered medications for this visit.    Allergies  Allergen Reactions   Codeine     Feel dizzy   Meperidine Hcl     Sick on stomach    Morphine     Nausea/sick on stomach   Pramipexole    Propoxyphene    Sinemet [Carbidopa W-Levodopa]    Aspirin Nausea Only    Sick on stomach   Diazepam Nausea Only    Sick on stomach   Meperidine Nausea Only   Oxycodone-Acetaminophen Nausea Only    Review of Systems negative except from HPI and PMH  Physical Exam BP 120/88 (BP Location: Right Arm, Patient Position: Sitting, Cuff Size: Normal)   Pulse 76   Ht '5\' 1"'$  (1.549  m)   Wt 152 lb 9.6 oz (69.2 kg)   SpO2 96%   BMI 28.83 kg/m  Well developed and nourished in no acute distress HENT normal Neck supple   Clear kyphosis Regular rate and rhythm, no murmurs or gallops Abd-soft    No Clubbing cyanosis edema Skin-warm and dry A & Oriented  Grossly normal sensory and motor function  ECG afib @ 76 -/07/34 occ v pacing   Assessment and  Plan Hypertension  HFpEF  Permanent atrial fib  Pacemaker Medtronic    Orthostatic intolerance   parkinsons disease  On Anticoagulant for thromboembolic risk reduction.  No bleeding issues.  Continue Rivaroxaban  at 15 mg daily Will check CBC  BP much more stable, continue apresoline 10, metop tar 50 bid,   Not on diuretics  Stable orthostatics on above BP regime.

## 2021-05-04 NOTE — Telephone Encounter (Signed)
MyChart message sent to patient regarding October remote transmission.

## 2021-05-05 LAB — CBC WITH DIFFERENTIAL/PLATELET
Basophils Absolute: 0 10*3/uL (ref 0.0–0.2)
Basos: 1 %
EOS (ABSOLUTE): 0.1 10*3/uL (ref 0.0–0.4)
Eos: 2 %
Hematocrit: 42.3 % (ref 34.0–46.6)
Hemoglobin: 14.3 g/dL (ref 11.1–15.9)
Immature Grans (Abs): 0 10*3/uL (ref 0.0–0.1)
Immature Granulocytes: 0 %
Lymphocytes Absolute: 1.7 10*3/uL (ref 0.7–3.1)
Lymphs: 28 %
MCH: 30.6 pg (ref 26.6–33.0)
MCHC: 33.8 g/dL (ref 31.5–35.7)
MCV: 91 fL (ref 79–97)
Monocytes Absolute: 0.5 10*3/uL (ref 0.1–0.9)
Monocytes: 9 %
Neutrophils Absolute: 3.7 10*3/uL (ref 1.4–7.0)
Neutrophils: 60 %
Platelets: 246 10*3/uL (ref 150–450)
RBC: 4.67 x10E6/uL (ref 3.77–5.28)
RDW: 13.3 % (ref 11.7–15.4)
WBC: 6.1 10*3/uL (ref 3.4–10.8)

## 2021-05-05 LAB — BASIC METABOLIC PANEL
BUN/Creatinine Ratio: 26 (ref 12–28)
BUN: 25 mg/dL (ref 10–36)
CO2: 25 mmol/L (ref 20–29)
Calcium: 9.8 mg/dL (ref 8.7–10.3)
Chloride: 99 mmol/L (ref 96–106)
Creatinine, Ser: 0.97 mg/dL (ref 0.57–1.00)
Glucose: 87 mg/dL (ref 65–99)
Potassium: 4.4 mmol/L (ref 3.5–5.2)
Sodium: 141 mmol/L (ref 134–144)
eGFR: 54 mL/min/{1.73_m2} — ABNORMAL LOW (ref >59)

## 2021-05-08 ENCOUNTER — Emergency Department
Admission: EM | Admit: 2021-05-08 | Discharge: 2021-05-08 | Disposition: A | Discharge status: Home / Self Care | Payer: Medicare PPO | Attending: Student in an Organized Health Care Education/Training Program | Admitting: Student in an Organized Health Care Education/Training Program

## 2021-05-08 ENCOUNTER — Emergency Department: Payer: Medicare PPO

## 2021-05-08 ENCOUNTER — Other Ambulatory Visit: Payer: Self-pay

## 2021-05-08 DIAGNOSIS — W1830XA Fall on same level, unspecified, initial encounter: Secondary | ICD-10-CM | POA: Diagnosis not present

## 2021-05-08 DIAGNOSIS — Z950 Presence of cardiac pacemaker: Secondary | ICD-10-CM | POA: Insufficient documentation

## 2021-05-08 DIAGNOSIS — Z79899 Other long term (current) drug therapy: Secondary | ICD-10-CM | POA: Diagnosis not present

## 2021-05-08 DIAGNOSIS — I10 Essential (primary) hypertension: Secondary | ICD-10-CM | POA: Diagnosis not present

## 2021-05-08 DIAGNOSIS — Y92002 Bathroom of unspecified non-institutional (private) residence single-family (private) house as the place of occurrence of the external cause: Secondary | ICD-10-CM | POA: Insufficient documentation

## 2021-05-08 DIAGNOSIS — S0101XA Laceration without foreign body of scalp, initial encounter: Secondary | ICD-10-CM

## 2021-05-08 DIAGNOSIS — Z23 Encounter for immunization: Secondary | ICD-10-CM | POA: Insufficient documentation

## 2021-05-08 DIAGNOSIS — Z85820 Personal history of malignant melanoma of skin: Secondary | ICD-10-CM | POA: Insufficient documentation

## 2021-05-08 DIAGNOSIS — S0990XA Unspecified injury of head, initial encounter: Secondary | ICD-10-CM | POA: Diagnosis present

## 2021-05-08 DIAGNOSIS — Z7901 Long term (current) use of anticoagulants: Secondary | ICD-10-CM | POA: Diagnosis not present

## 2021-05-08 DIAGNOSIS — W19XXXA Unspecified fall, initial encounter: Secondary | ICD-10-CM

## 2021-05-08 DIAGNOSIS — I4891 Unspecified atrial fibrillation: Secondary | ICD-10-CM | POA: Insufficient documentation

## 2021-05-08 MED ORDER — LIDOCAINE-EPINEPHRINE 2 %-1:100000 IJ SOLN
20.0000 mL | Freq: Once | INTRAMUSCULAR | Status: AC
  Administered 2021-05-08: 20 mL via INTRADERMAL
  Filled 2021-05-08: qty 1

## 2021-05-08 MED ORDER — BACITRACIN ZINC 500 UNIT/GM EX OINT
TOPICAL_OINTMENT | Freq: Once | CUTANEOUS | Status: AC
  Filled 2021-05-08: qty 1.8

## 2021-05-08 MED ORDER — TETANUS-DIPHTH-ACELL PERTUSSIS 5-2.5-18.5 LF-MCG/0.5 IM SUSY
0.5000 mL | PREFILLED_SYRINGE | Freq: Once | INTRAMUSCULAR | Status: AC
  Administered 2021-05-08: 0.5 mL via INTRAMUSCULAR
  Filled 2021-05-08: qty 0.5

## 2021-05-08 NOTE — ED Triage Notes (Signed)
Pt states she was trying to go to the bathroom and had a mechanical fall- pt has a lac to the R side of the back of her head- pt bled through bandage that EMS placed- attempted to check wound but lac began to bleed again- pt is on blood thinners

## 2021-05-08 NOTE — ED Triage Notes (Signed)
First nurse note: comes from [facility] for mechanical fall. Lac to back of head. Aox4. VSS. Some bleeding through the bandage. On thinners.

## 2021-05-08 NOTE — ED Provider Notes (Signed)
[facility] Emergency Department Provider Note    Event Date/Time   First MD Initiated Contact with Patient 05/08/21 1726     (approximate)  I have reviewed the triage vital signs and the nursing notes.   HISTORY  Chief Complaint Fall    HPI Whitney Hoffman is a 87 y.o. female below listed past medical history presents to the ER for evaluation of mechanical fall that occurred as she was getting up to use the restroom.  And laceration the back of her head.  No LOC.  Denies any other associated pain.  No numbness or tingling.  Takes Xarelto for history of A. fib.  Past Medical History:  Diagnosis Date   Atrial fibrillation ([place])    Hyperlipidemia    Hypertension    Parkinson disease ([place])    Tachy-brady syndrome ([place])    with medtronic dual chambe rPCM   Family History  Problem Relation Age of Onset   Diabetes Mother    Coronary artery disease Father    Heart attack Father    Alzheimer's disease Father    Kidney cancer Neg Hx    Bladder Cancer Neg Hx    Past Surgical History:  Procedure Laterality Date   ABDOMINAL HYSTERECTOMY  1966   fibroids ovaries removed secondary to ovarian cyst   APPENDECTOMY  1944   baldder tack     with her hysterectomy   BREAST BIOPSY  2010   left benign repeat ultrasound recommneded in 2011 [place] Left    ORIF x 2; post op infection Left hip   PACEMAKER PLACEMENT  06/29/2010   WRIST SURGERY  1996   Traumatic B   Patient Active Problem List   Diagnosis Date Noted   Visual disturbance 03/16/2018   Chronic venous stasis 03/16/2018   Depression 03/15/2018   Vertebral compression fracture ([place]) 05/19/2017   Back pain 11/30/2016   Allergic rhinitis 06/15/2015   Arthritis 06/15/2015   History of colonic polyps 06/15/2015   DDD (degenerative disc disease), lumbar 06/15/2015   Fatigue 06/15/2015   Irritable  bladder 06/15/2015   Left knee pain 06/15/2015   Nevus 06/15/2015   Parkinson's disease ([place]) 06/15/2015   Peptic ulcer disease 06/15/2015   Skin cancer of nose 06/15/2015   Vertigo 06/15/2015   Dyspnea on exertion 08/08/2013   Chronic diastolic heart failure ([place]) 08/07/2012   VENOUS HYPERTENSION, CHRONIC W/ INFLAMMATION 07/21/2010   Essential hypertension 07/08/2010   ATRIAL FIBRILLATION 07/08/2010   PACEMAKER-Medtronic 07/08/2010   Senile osteoporosis 10/18/2009   Pure hypercholesterolemia 10/05/2009      Prior to Admission medications   Medication Sig Start Date End Date Taking? Authorizing Provider  acetaminophen (TYLENOL) 325 MG tablet Take 2 tablets (650 mg) by mouth every 6 hours as needed for pain/ fever    [provider]  calcium carbonate (OS-CAL) 600 MG tablet Take 1 tablet (600 mg) by mouth once daily    [provider]  Cholecalciferol (VITAMIN D-3) 5000 units TABS Take 1 tablet by mouth daily.    [provider]  dicyclomine (BENTYL) 20 MG tablet Take 1/2 tablet (10 mg) by mouth twice daily    [provider]  ezetimibe (ZETIA) 10 MG tablet Take 1 tablet (10 mg total) by mouth daily. 11/30/16   Birdie Sons, MD  fexofenadine (ALLEGRA) 60 MG tablet Take by  mouth. Take 1 tablet (60 mg) by mouth once daily    [provider]  glycopyrrolate (ROBINUL) 2 MG tablet Take 1 tablet (2 mg) by mouth once daily    [provider]  hydrALAZINE (APRESOLINE) 10 MG tablet Take 1 tablet (10 mg) by mouth twice daily 02/07/20   Deboraha Sprang, MD  loperamide (IMODIUM A-D) 2 MG tablet Take 2 tablets (4 mg) by mouth after 1st loose stool, then 1 tablet (2 mg) by mouth after each subsequent loose stool (do not exceed 4 tablets in 24 hours).    [provider]  metoprolol tartrate (LOPRESSOR) 50 MG tablet Take 1 tablet (50 mg) by mouth twice daily 02/07/20   Deboraha Sprang, MD  mirabegron ER (MYRBETRIQ) 50 MG TB24 tablet Take 1  tablet (50  mg) by mouth once daily    [provider]  mirtazapine (REMERON) 15 MG tablet TAKE 1 TABLET BY MOUTH AT BEDTIME 08/21/18   Virginia Crews, MD  nystatin (MYCOSTATIN/NYSTOP) powder Apply 2 times daily as needed for rash 12/12/19   [provider]  ondansetron (ZOFRAN) 4 MG tablet Take 1 tablet (4 mg) by mouth every 6 hours as needed for nausea    [provider]  Polyethylene Glycol 3350 (GAVILAX PO) Mix 1 capful (17 gm) in 8 oz of fluid/ drink twice daily as needed for constipation    [provider]  Rivaroxaban (XARELTO) 15 MG TABS tablet Take 1 tablet (15 mg total) by mouth daily with supper. 12/01/16   Deboraha Sprang, MD  senna (SENOKOT) 8.6 MG TABS tablet Take 2 tablets (17.2 mg) by mouth at bedtime as needed for constipation    [provider]  vitamin B-12 (CYANOCOBALAMIN) 250 MCG tablet Take 2 tablets (500 mcg) under the tongue once daily    [provider]    Allergies Codeine, Meperidine hcl, Morphine, Pramipexole, Propoxyphene, Sinemet [carbidopa w-levodopa], Aspirin, Diazepam, Meperidine, and Oxycodone-acetaminophen    Social History Social History   Tobacco Use   Smoking status: Never   Smokeless tobacco: Never   Tobacco comments:    tobacco use- no   Vaping Use   Vaping Use: Never used  Substance Use Topics   Alcohol use: No   Drug use: No    Review of Systems Patient denies headaches, rhinorrhea, blurry vision, numbness, shortness of breath, chest pain, edema, cough, abdominal pain, nausea, vomiting, diarrhea, dysuria, fevers, rashes or hallucinations unless otherwise stated above in HPI. ____________________________________________   PHYSICAL EXAM:  VITAL SIGNS: Vitals:   05/08/21 1546 05/08/21 1736  BP: (!) 166/95 (!) 194/97  Pulse: 94 (!) 104  Resp: 18 (!) 21  Temp: 98.4 F (36.9 C)   SpO2: 95% 96%    Constitutional: Alert and oriented.  Eyes: Conjunctivae are normal.  Head: 1.5  cm laceration of the right parietal scalp with associated hematoma.  No battle sign.  No raccoon eyes Nose: No congestion/rhinnorhea. Mouth/Throat: Mucous membranes are moist.   Neck: No stridor. Painless ROM.  Cardiovascular: Normal rate, regular rhythm. Grossly normal heart sounds.  Good peripheral circulation. Respiratory: Normal respiratory effort.  No retractions. Lungs CTAB. Gastrointestinal: Soft and nontender. No distention. No abdominal bruits. No CVA tenderness. Genitourinary:  Musculoskeletal: No lower extremity tenderness nor edema.  No joint effusions. Neurologic:  Normal speech and language. No gross focal neurologic deficits are appreciated. No facial droop Skin:  Skin is warm, dry.No rash noted. Psychiatric: Mood and affect are normal. Speech and behavior are  normal.  ____________________________________________   LABS (all labs ordered are listed, but only abnormal results are displayed)  No results found for this or any previous visit (from the past 24 hour(s)). ____________________________________________ ____________________________________________  [id number]  I personally reviewed all radiographic images ordered to evaluate for the above acute complaints and reviewed radiology reports and findings.  These findings were personally discussed with the patient.  Please see medical record for radiology report.  ____________________________________________   PROCEDURES  Procedure(s) performed:  Marland KitchenMarland KitchenLaceration Repair  Date/Time: 05/08/2021 5:57 PM Performed by: Merlyn Lot, MD Authorized by: Merlyn Lot, MD   Consent:    Consent obtained:  Verbal   Consent given by:  Patient   Risks discussed:  Infection, pain, retained foreign body, poor cosmetic result and poor wound healing Anesthesia:    Anesthesia method:  Local infiltration   Local anesthetic:  Lidocaine 1% WITH epi Laceration details:    Location:  Scalp   Scalp location:  R parietal    Length (cm):  4 Exploration:    Hemostasis achieved with:  Direct pressure   Contaminated: no   Treatment:    Area cleansed with:  Saline and chlorhexidine   Amount of cleaning:  Extensive   Irrigation solution:  Sterile saline   Visualized foreign bodies/material removed: no   Skin repair:    Repair method:  Staples   Number of staples:  10 Approximation:    Approximation:  Close Repair type:    Repair type:  Simple Post-procedure details:    Dressing:  Sterile dressing   Procedure completion:  Tolerated well, no immediate complications    Critical Care performed: no ____________________________________________   INITIAL IMPRESSION / ASSESSMENT AND PLAN / ED COURSE  Pertinent labs & imaging results that were available during my care of the patient were reviewed by me and considered in my medical decision making (see chart for details).   DDX: Fracture, contusion, laceration, SDH, IPH, concussion  Whitney Hoffman is a 87 y.o. who presents to the ED with presentation as described above.  Patient with 1.5 inch laceration as described above requiring repair.  CT imaging ordered for the but differential fortunately is reassuring.  Her remainder of her exam is reassuring.  TDAP updated. Stable appropriate for outpatient follow-up.     The patient was evaluated in Emergency Department today for the symptoms described in the history of present illness. He/she was evaluated in the context of the global COVID-19 pandemic, which necessitated consideration that the patient might be at risk for infection with the SARS-CoV-2 virus that causes COVID-19. Institutional protocols and algorithms that pertain to the evaluation of patients at risk for COVID-19 are in a state of rapid change based on information released by regulatory bodies including the CDC and federal and state organizations. These policies and algorithms were followed during the patient's care in the ED.  As part of my medical  decision making, I reviewed the following data within the [place] notes reviewed and incorporated, Labs reviewed, notes from prior ED visits and  Controlled Substance Database   ____________________________________________   FINAL CLINICAL IMPRESSION(S) / ED DIAGNOSES  Final diagnoses:  Laceration of scalp, initial encounter  Fall, initial encounter      NEW MEDICATIONS STARTED DURING THIS VISIT:  New Prescriptions   No medications on file     Note:  This document was prepared using Dragon voice recognition software and may include unintentional dictation errors.    Merlyn Lot, MD 05/08/21  1800  

## 2021-05-08 NOTE — ED Notes (Signed)
Called [facility] for transport to [facility]

## 2021-05-11 ENCOUNTER — Other Ambulatory Visit: Payer: Self-pay | Admitting: Internal Medicine

## 2021-05-11 DIAGNOSIS — R1031 Right lower quadrant pain: Secondary | ICD-10-CM

## 2021-05-20 ENCOUNTER — Other Ambulatory Visit: Payer: Self-pay

## 2021-05-20 ENCOUNTER — Ambulatory Visit
Admission: RE | Admit: 2021-05-20 | Discharge: 2021-05-20 | Disposition: A | Discharge status: Home / Self Care | Payer: Medicare PPO | Source: Ambulatory Visit | Attending: Internal Medicine | Admitting: Internal Medicine

## 2021-05-20 ENCOUNTER — Ambulatory Visit: Payer: Medicare PPO

## 2021-05-20 DIAGNOSIS — R1031 Right lower quadrant pain: Secondary | ICD-10-CM | POA: Diagnosis present

## 2021-08-29 ENCOUNTER — Other Ambulatory Visit: Payer: Self-pay

## 2021-08-29 ENCOUNTER — Emergency Department
Admission: EM | Admit: 2021-08-29 | Discharge: 2021-08-29 | Disposition: A | Discharge status: Skilled Nursing Facility | Payer: Medicare PPO | Attending: Emergency Medicine | Admitting: Emergency Medicine

## 2021-08-29 ENCOUNTER — Encounter: Payer: Self-pay | Admitting: Emergency Medicine

## 2021-08-29 DIAGNOSIS — R531 Weakness: Secondary | ICD-10-CM | POA: Diagnosis present

## 2021-08-29 DIAGNOSIS — R410 Disorientation, unspecified: Secondary | ICD-10-CM | POA: Diagnosis not present

## 2021-08-29 DIAGNOSIS — Z8616 Personal history of COVID-19: Secondary | ICD-10-CM | POA: Diagnosis not present

## 2021-08-29 DIAGNOSIS — N390 Urinary tract infection, site not specified: Secondary | ICD-10-CM | POA: Diagnosis not present

## 2021-08-29 DIAGNOSIS — E860 Dehydration: Secondary | ICD-10-CM

## 2021-08-29 LAB — CBC
HCT: 41.5 % (ref 36.0–46.0)
Hemoglobin: 13.6 g/dL (ref 12.0–15.0)
MCH: 30.7 pg (ref 26.0–34.0)
MCHC: 32.8 g/dL (ref 30.0–36.0)
MCV: 93.7 fL (ref 80.0–100.0)
Platelets: 331 10*3/uL (ref 150–400)
RBC: 4.43 MIL/uL (ref 3.87–5.11)
RDW: 13.2 % (ref 11.5–15.5)
WBC: 6.9 10*3/uL (ref 4.0–10.5)
nRBC: 0 % (ref 0.0–0.2)

## 2021-08-29 LAB — URINALYSIS, ROUTINE W REFLEX MICROSCOPIC
Bacteria, UA: NONE SEEN
Bilirubin Urine: NEGATIVE
Glucose, UA: NEGATIVE mg/dL
Ketones, ur: NEGATIVE mg/dL
Nitrite: NEGATIVE
Protein, ur: NEGATIVE mg/dL
Specific Gravity, Urine: 1.005 (ref 1.005–1.030)
pH: 7 (ref 5.0–8.0)

## 2021-08-29 LAB — BASIC METABOLIC PANEL
Anion gap: 8 (ref 5–15)
BUN: 22 mg/dL (ref 8–23)
CO2: 28 mmol/L (ref 22–32)
Calcium: 9.1 mg/dL (ref 8.9–10.3)
Chloride: 94 mmol/L — ABNORMAL LOW (ref 98–111)
Creatinine, Ser: 1.1 mg/dL — ABNORMAL HIGH (ref 0.44–1.00)
GFR, Estimated: 46 mL/min — ABNORMAL LOW (ref >60)
Glucose, Bld: 97 mg/dL (ref 70–99)
Potassium: 3.9 mmol/L (ref 3.5–5.1)
Sodium: 130 mmol/L — ABNORMAL LOW (ref 135–145)

## 2021-08-29 MED ORDER — CEPHALEXIN 500 MG PO CAPS
500.0000 mg | ORAL_CAPSULE | Freq: Once | ORAL | Status: AC
  Administered 2021-08-29: 500 mg via ORAL
  Filled 2021-08-29: qty 1

## 2021-08-29 MED ORDER — CEPHALEXIN 500 MG PO CAPS: 500.0000 mg | ORAL_CAPSULE | Freq: Two times a day (BID) | ORAL | 0 refills | Status: AC

## 2021-08-29 MED ORDER — SODIUM CHLORIDE 0.9 % IV SOLN
1000.0000 mL | Freq: Once | INTRAVENOUS | Status: AC
  Administered 2021-08-29: 1000 mL via INTRAVENOUS

## 2021-08-29 NOTE — ED Triage Notes (Signed)
Pt via EMS from [facility]. Pt denies pain. Per First Nurse note, pt here for weakness and poss dehydration. Pt is A&Ox4 and NAD

## 2021-08-29 NOTE — ED Notes (Signed)
RN attempted to call [facility] multiple times for to notify them of pt arrival to their facility via EMS and to give report handoff. [place] did not answer the phone.

## 2021-08-29 NOTE — ED Provider Triage Note (Signed)
Emergency Medicine Provider Triage Evaluation Note  Whitney Hoffman , a 88 y.o. female  was evaluated in triage.  Pt complains of weakness. Reports this started after having COVID.  No fever, chills, vomiting, diarrhea.  No chest pain, shortness of breath, abdominal pain.  Diagnosed with COVID 1 week ago.  Has current bedsores.  Review of Systems  Positive: Weakness Negative: Dizziness, vision changes, chest pain, shortness of breath, nausea, vomiting, diarrhea  Physical Exam  There were no vitals taken for this visit. Gen:   Awake, no distress   Resp:  Normal effort  Abd:  Soft, nontender Neuro:  Alert, engages appropriately  Medical Decision Making  Medically screening exam initiated at 12:25 PM.  Appropriate orders placed.  Donovan Kail was informed that the remainder of the evaluation will be completed by another provider, this initial triage assessment does not replace that evaluation, and the importance of remaining in the ED until their evaluation is complete.  Weakness:  EKG, CBC, BMET, urinalysis   Jearld Fenton, NP 08/29/21 1233

## 2021-08-29 NOTE — ED Triage Notes (Signed)
Pt in via EMS from [facility] [place]. Facility reports pt may be dehydrated and wanted her evaluated. #20g to left forearm. 192/94, 97% RA, paced rhythm at 84, pt c/o pain to left hip/bottom

## 2021-08-29 NOTE — ED Provider Notes (Signed)
[facility] Provider Note    Event Date/Time   First MD Initiated Contact with Patient 08/29/21 1713     (approximate)   History   Weakness   HPI  Whitney Hoffman is a 88 y.o. female who presents for weakness and possible dehydration.  Patient is at [facility] assisted living.  At baseline she is able to stand and help transfer.  Recently has seemed more confused than typical, is recovering from COVID-19.  There is concerned about a UTI given dark urine.  Possible dehydration because of decreased p.o. intake     Physical Exam   Triage Vital Signs: ED Triage Vitals [08/29/21 1227]  Enc Vitals Group     BP (!) 158/105     Pulse Rate 95     Resp 20     Temp 98 F (36.7 C)     Temp Source Oral     SpO2 95 %     Weight 68.9 kg (152 lb)     Height 1.549 m (5\' 1" )     Head Circumference      Peak Flow      Pain Score 0     Pain Loc      Pain Edu?      Excl. in [place]?     Most recent vital signs: Vitals:   08/29/21 1227 08/29/21 1601  BP: (!) 158/105 (!) 156/77  Pulse: 95 81  Resp: 20 16  Temp: 98 F (36.7 C)   SpO2: 95% 94%     General: Awake, no distress.  CV:  Good peripheral perfusion.  Resp:  Normal effort.  Abd:  No distention.  No abdominal tenderness palpation Other:     ED Results / Procedures / Treatments   Labs (all labs ordered are listed, but only abnormal results are displayed) Labs Reviewed  BASIC METABOLIC PANEL - Abnormal; Notable for the following components:      Result Value   Sodium 130 (*)    Chloride 94 (*)    Creatinine, Ser 1.10 (*)    GFR, Estimated 46 (*)    All other components within normal limits  URINALYSIS, ROUTINE W REFLEX MICROSCOPIC - Abnormal; Notable for the following components:   Color, Urine YELLOW (*)    APPearance CLEAR (*)    Hgb urine dipstick LARGE (*)    Leukocytes,Ua LARGE (*)    All other components within normal limits  URINE CULTURE  CBC  CBG MONITORING, ED      EKG  ED ECG REPORT I, Lavonia Drafts, the attending physician, personally viewed and interpreted this ECG.  Date: 08/29/2021  Rhythm: Atrial fibrillation QRS Axis: normal Intervals: Abnormal ST/T Wave abnormalities: normal Narrative Interpretation: no evidence of acute ischemia    RADIOLOGY     PROCEDURES:  Critical Care performed:   Procedures   MEDICATIONS ORDERED IN ED: Medications  0.9 %  sodium chloride infusion (1,000 mLs Intravenous New Bag/Given 08/29/21 1841)  cephALEXin (KEFLEX) capsule 500 mg (500 mg Oral Given 08/29/21 1941)     IMPRESSION / MDM / ASSESSMENT AND PLAN / ED COURSE  I reviewed the triage vital signs and the nursing notes.  Patient presents with increased weakness, likely related to COVID-19 which she is just now recovering from versus dehydration versus possible UTI  Overall she does appear mildly dehydrated, her BUN and creatinine are overall reassuring.  CBC is unremarkable.  Urinalysis demonstrates large leukocytes and 11-20 white blood cells, will cover  with p.o. Keflex  Patient treated with IV fluids to help rehydrate  Considered admission however the patient appears to be nearly at her baseline and with fluids and treatment for UTI anticipate improvement      FINAL CLINICAL IMPRESSION(S) / ED DIAGNOSES   Final diagnoses:  Lower urinary tract infectious disease  Dehydration     Rx / DC Orders   ED Discharge Orders          Ordered    cephALEXin (KEFLEX) 500 MG capsule  2 times daily        08/29/21 1910             Note:  This document was prepared using Dragon voice recognition software and may include unintentional dictation errors.   Lavonia Drafts, MD 08/29/21 [phone]

## 2021-08-31 LAB — URINE CULTURE: Culture: <10000 — AB

## 2021-09-01 ENCOUNTER — Ambulatory Visit (INDEPENDENT_AMBULATORY_CARE_PROVIDER_SITE_OTHER): Payer: Medicare PPO

## 2021-09-01 DIAGNOSIS — I495 Sick sinus syndrome: Secondary | ICD-10-CM | POA: Diagnosis not present

## 2021-09-01 LAB — CUP PACEART REMOTE DEVICE CHECK
Battery Impedance: 2935 Ohm
Battery Remaining Longevity: 25 mo
Battery Voltage: 2.73 V
Brady Statistic RV Percent Paced: 20 %
Date Time Interrogation Session: 20230110173025
Implantable Lead Implant Date: 20111108
Implantable Lead Implant Date: 20111108
Implantable Lead Location: 753859
Implantable Lead Location: 753860
Implantable Lead Model: 5076
Implantable Lead Model: 5076
Implantable Pulse Generator Implant Date: 20111108
Lead Channel Impedance Value: 512 Ohm
Lead Channel Impedance Value: 67 Ohm
Lead Channel Pacing Threshold Amplitude: 0.875 V
Lead Channel Pacing Threshold Pulse Width: 0.4 ms
Lead Channel Setting Pacing Amplitude: 2.5 V
Lead Channel Setting Pacing Pulse Width: 0.4 ms
Lead Channel Setting Sensing Sensitivity: 2.8 mV

## 2021-09-10 NOTE — Progress Notes (Signed)
Remote pacemaker transmission.   

## 2021-11-20 DEATH — deceased
